# Patient Record
Sex: Male | Born: 1935 | Race: White | Hispanic: No | Marital: Married | Smoking: Former smoker
Health system: Southern US, Community
[De-identification: ages and names within clinical notes are randomized; demographics above are authoritative.]

## PROBLEM LIST (undated history)

## (undated) DIAGNOSIS — Z7901 Long term (current) use of anticoagulants: Secondary | ICD-10-CM

## (undated) DIAGNOSIS — N400 Enlarged prostate without lower urinary tract symptoms: Secondary | ICD-10-CM

## (undated) DIAGNOSIS — I452 Bifascicular block: Secondary | ICD-10-CM

## (undated) DIAGNOSIS — I4892 Unspecified atrial flutter: Secondary | ICD-10-CM

## (undated) DIAGNOSIS — I441 Atrioventricular block, second degree: Secondary | ICD-10-CM

## (undated) DIAGNOSIS — R001 Bradycardia, unspecified: Secondary | ICD-10-CM

## (undated) DIAGNOSIS — M353 Polymyalgia rheumatica: Secondary | ICD-10-CM

## (undated) DIAGNOSIS — N200 Calculus of kidney: Secondary | ICD-10-CM

## (undated) DIAGNOSIS — E785 Hyperlipidemia, unspecified: Secondary | ICD-10-CM

## (undated) DIAGNOSIS — I2510 Atherosclerotic heart disease of native coronary artery without angina pectoris: Secondary | ICD-10-CM

## (undated) HISTORY — DX: Long term (current) use of anticoagulants: Z79.01

## (undated) HISTORY — DX: Benign prostatic hyperplasia without lower urinary tract symptoms: N40.0

## (undated) HISTORY — DX: Hyperlipidemia, unspecified: E78.5

## (undated) HISTORY — DX: Atherosclerotic heart disease of native coronary artery without angina pectoris: I25.10

## (undated) HISTORY — DX: Unspecified atrial flutter: I48.92

## (undated) HISTORY — DX: Polymyalgia rheumatica: M35.3

## (undated) HISTORY — DX: Bradycardia, unspecified: R00.1

## (undated) HISTORY — PX: LUMBAR DISC SURGERY: SHX700

## (undated) HISTORY — DX: Bifascicular block: I45.2

## (undated) HISTORY — DX: Calculus of kidney: N20.0

---

## 1988-09-06 HISTORY — PX: CORONARY ARTERY BYPASS GRAFT: SHX141

## 2001-08-21 ENCOUNTER — Ambulatory Visit (HOSPITAL_COMMUNITY): Admission: RE | Admit: 2001-08-21 | Discharge: 2001-08-21 | Payer: Self-pay | Admitting: Internal Medicine

## 2001-08-21 HISTORY — PX: COLONOSCOPY: SHX174

## 2001-08-22 ENCOUNTER — Encounter: Payer: Self-pay | Admitting: Internal Medicine

## 2001-08-22 ENCOUNTER — Ambulatory Visit (HOSPITAL_COMMUNITY): Admission: RE | Admit: 2001-08-22 | Discharge: 2001-08-22 | Payer: Self-pay | Admitting: *Deleted

## 2002-07-23 ENCOUNTER — Encounter: Payer: Self-pay | Admitting: Emergency Medicine

## 2002-07-23 ENCOUNTER — Inpatient Hospital Stay (HOSPITAL_COMMUNITY): Admission: EM | Admit: 2002-07-23 | Discharge: 2002-07-25 | Payer: Self-pay | Admitting: Emergency Medicine

## 2002-09-10 ENCOUNTER — Ambulatory Visit (HOSPITAL_COMMUNITY): Admission: RE | Admit: 2002-09-10 | Discharge: 2002-09-10 | Payer: Self-pay | Admitting: Ophthalmology

## 2002-11-19 ENCOUNTER — Ambulatory Visit (HOSPITAL_COMMUNITY): Admission: RE | Admit: 2002-11-19 | Discharge: 2002-11-19 | Payer: Self-pay | Admitting: Ophthalmology

## 2004-11-11 ENCOUNTER — Ambulatory Visit (HOSPITAL_COMMUNITY): Admission: RE | Admit: 2004-11-11 | Discharge: 2004-11-11 | Payer: Self-pay | Admitting: Internal Medicine

## 2005-01-31 ENCOUNTER — Inpatient Hospital Stay (HOSPITAL_COMMUNITY): Admission: EM | Admit: 2005-01-31 | Discharge: 2005-02-02 | Payer: Self-pay | Admitting: Emergency Medicine

## 2005-02-02 ENCOUNTER — Ambulatory Visit: Payer: Self-pay | Admitting: *Deleted

## 2005-12-06 ENCOUNTER — Emergency Department: Payer: Self-pay | Admitting: Emergency Medicine

## 2005-12-20 ENCOUNTER — Emergency Department: Payer: Self-pay | Admitting: Emergency Medicine

## 2008-05-20 ENCOUNTER — Emergency Department (HOSPITAL_COMMUNITY): Admission: EM | Admit: 2008-05-20 | Discharge: 2008-05-20 | Payer: Self-pay | Admitting: Emergency Medicine

## 2008-09-09 ENCOUNTER — Ambulatory Visit: Payer: Self-pay | Admitting: Cardiology

## 2008-09-10 ENCOUNTER — Encounter (INDEPENDENT_AMBULATORY_CARE_PROVIDER_SITE_OTHER): Payer: Self-pay | Admitting: Internal Medicine

## 2008-09-10 ENCOUNTER — Ambulatory Visit: Payer: Self-pay | Admitting: Cardiology

## 2008-09-10 ENCOUNTER — Ambulatory Visit (HOSPITAL_COMMUNITY): Admission: RE | Admit: 2008-09-10 | Discharge: 2008-09-10 | Payer: Self-pay | Admitting: Internal Medicine

## 2008-10-09 ENCOUNTER — Ambulatory Visit: Payer: Self-pay | Admitting: Cardiology

## 2008-10-17 ENCOUNTER — Ambulatory Visit: Payer: Self-pay | Admitting: Cardiology

## 2008-10-24 ENCOUNTER — Ambulatory Visit: Payer: Self-pay | Admitting: Cardiology

## 2008-10-29 ENCOUNTER — Ambulatory Visit: Payer: Self-pay | Admitting: Cardiology

## 2008-11-11 ENCOUNTER — Ambulatory Visit: Payer: Self-pay | Admitting: Cardiology

## 2008-11-11 ENCOUNTER — Ambulatory Visit (HOSPITAL_COMMUNITY): Admission: RE | Admit: 2008-11-11 | Discharge: 2008-11-11 | Payer: Self-pay | Admitting: Internal Medicine

## 2008-11-12 ENCOUNTER — Ambulatory Visit (HOSPITAL_COMMUNITY): Admission: RE | Admit: 2008-11-12 | Discharge: 2008-11-12 | Payer: Self-pay | Admitting: Urology

## 2008-11-27 ENCOUNTER — Observation Stay (HOSPITAL_COMMUNITY): Admission: RE | Admit: 2008-11-27 | Discharge: 2008-11-28 | Payer: Self-pay | Admitting: Urology

## 2008-12-09 ENCOUNTER — Ambulatory Visit: Payer: Self-pay | Admitting: Cardiology

## 2008-12-16 ENCOUNTER — Ambulatory Visit (HOSPITAL_COMMUNITY): Admission: RE | Admit: 2008-12-16 | Discharge: 2008-12-16 | Payer: Self-pay | Admitting: Urology

## 2008-12-16 ENCOUNTER — Ambulatory Visit: Payer: Self-pay | Admitting: Cardiology

## 2008-12-23 ENCOUNTER — Ambulatory Visit: Payer: Self-pay | Admitting: Cardiology

## 2008-12-25 ENCOUNTER — Ambulatory Visit (HOSPITAL_COMMUNITY): Admission: RE | Admit: 2008-12-25 | Discharge: 2008-12-25 | Payer: Self-pay | Admitting: Urology

## 2008-12-30 ENCOUNTER — Ambulatory Visit: Payer: Self-pay | Admitting: Cardiology

## 2009-01-09 ENCOUNTER — Ambulatory Visit: Payer: Self-pay | Admitting: Cardiology

## 2009-01-20 ENCOUNTER — Ambulatory Visit (HOSPITAL_COMMUNITY): Admission: RE | Admit: 2009-01-20 | Discharge: 2009-01-20 | Payer: Self-pay | Admitting: Urology

## 2009-01-30 ENCOUNTER — Ambulatory Visit: Payer: Self-pay | Admitting: Cardiology

## 2009-02-24 ENCOUNTER — Ambulatory Visit: Payer: Self-pay | Admitting: Cardiology

## 2009-03-26 DIAGNOSIS — N400 Enlarged prostate without lower urinary tract symptoms: Secondary | ICD-10-CM | POA: Insufficient documentation

## 2009-03-26 DIAGNOSIS — M353 Polymyalgia rheumatica: Secondary | ICD-10-CM | POA: Insufficient documentation

## 2009-03-26 DIAGNOSIS — Z87898 Personal history of other specified conditions: Secondary | ICD-10-CM

## 2009-03-26 DIAGNOSIS — N200 Calculus of kidney: Secondary | ICD-10-CM | POA: Insufficient documentation

## 2009-03-27 ENCOUNTER — Encounter: Payer: Self-pay | Admitting: Cardiology

## 2009-03-27 ENCOUNTER — Ambulatory Visit: Payer: Self-pay | Admitting: Cardiology

## 2009-04-21 ENCOUNTER — Encounter: Payer: Self-pay | Admitting: *Deleted

## 2009-05-01 ENCOUNTER — Ambulatory Visit: Payer: Self-pay | Admitting: Cardiology

## 2009-05-14 ENCOUNTER — Ambulatory Visit: Payer: Self-pay

## 2009-05-14 LAB — CONVERTED CEMR LAB: POC INR: 1.9

## 2009-06-05 ENCOUNTER — Ambulatory Visit: Payer: Self-pay | Admitting: Cardiology

## 2009-06-26 ENCOUNTER — Ambulatory Visit: Payer: Self-pay | Admitting: Cardiology

## 2009-07-24 ENCOUNTER — Ambulatory Visit: Payer: Self-pay | Admitting: Cardiology

## 2009-08-28 ENCOUNTER — Ambulatory Visit: Payer: Self-pay | Admitting: Cardiology

## 2009-08-28 LAB — CONVERTED CEMR LAB: POC INR: 2.1

## 2009-09-03 ENCOUNTER — Encounter (INDEPENDENT_AMBULATORY_CARE_PROVIDER_SITE_OTHER): Payer: Self-pay | Admitting: *Deleted

## 2009-09-03 LAB — CONVERTED CEMR LAB
AST: 23 units/L
Alkaline Phosphatase: 60 units/L
BUN: 16 mg/dL
CO2: 22 meq/L
Calcium: 9.4 mg/dL
Chloride: 105 meq/L
Creatinine, Ser: 0.64 mg/dL
Glucose, Bld: 90 mg/dL
Potassium: 4.6 meq/L
Sodium: 141 meq/L

## 2009-09-22 ENCOUNTER — Encounter (INDEPENDENT_AMBULATORY_CARE_PROVIDER_SITE_OTHER): Payer: Self-pay | Admitting: *Deleted

## 2009-09-25 ENCOUNTER — Ambulatory Visit: Payer: Self-pay | Admitting: Cardiology

## 2009-09-25 ENCOUNTER — Encounter (INDEPENDENT_AMBULATORY_CARE_PROVIDER_SITE_OTHER): Payer: Self-pay | Admitting: *Deleted

## 2009-09-25 LAB — CONVERTED CEMR LAB: POC INR: 2.2

## 2009-09-29 ENCOUNTER — Encounter (INDEPENDENT_AMBULATORY_CARE_PROVIDER_SITE_OTHER): Payer: Self-pay | Admitting: *Deleted

## 2009-09-29 LAB — CONVERTED CEMR LAB: OCCULT 2: NEGATIVE

## 2009-10-03 ENCOUNTER — Encounter (INDEPENDENT_AMBULATORY_CARE_PROVIDER_SITE_OTHER): Payer: Self-pay | Admitting: *Deleted

## 2009-10-23 ENCOUNTER — Ambulatory Visit: Payer: Self-pay | Admitting: Cardiology

## 2009-11-20 ENCOUNTER — Ambulatory Visit: Payer: Self-pay | Admitting: Cardiology

## 2009-11-20 LAB — CONVERTED CEMR LAB: POC INR: 2.7

## 2009-12-10 ENCOUNTER — Encounter (INDEPENDENT_AMBULATORY_CARE_PROVIDER_SITE_OTHER): Payer: Self-pay | Admitting: *Deleted

## 2009-12-10 ENCOUNTER — Encounter: Payer: Self-pay | Admitting: Cardiology

## 2009-12-10 LAB — CONVERTED CEMR LAB
Cholesterol: 219 mg/dL
Cholesterol: 219 mg/dL — ABNORMAL HIGH (ref 0–200)
HCT: 46.7 %
HCT: 46.7 % (ref 39.0–52.0)
HDL: 54 mg/dL
HDL: 54 mg/dL (ref >39)
LDL Cholesterol: 149 mg/dL
LDL Cholesterol: 149 mg/dL — ABNORMAL HIGH (ref 0–99)
Lymphocytes Relative: 35 %
MCHC: 31.7 g/dL (ref 30.0–36.0)
MCV: 93.6 fL
MCV: 93.6 fL (ref 78.0–100.0)
Monocytes Absolute: 0.4 10*3/uL
Monocytes Relative: 7 %
Monocytes Relative: 7 % (ref 3–12)
Platelets: 219 10*3/uL
Platelets: 219 10*3/uL (ref 150–400)
RDW: 15 % (ref 11.5–15.5)
Triglycerides: 79 mg/dL
VLDL: 16 mg/dL (ref 0–40)
WBC: 5.7 10*3/uL
WBC: 5.7 10*3/uL (ref 4.0–10.5)

## 2009-12-18 ENCOUNTER — Ambulatory Visit: Payer: Self-pay | Admitting: Cardiology

## 2009-12-18 LAB — CONVERTED CEMR LAB: POC INR: 3.4

## 2009-12-23 ENCOUNTER — Encounter (INDEPENDENT_AMBULATORY_CARE_PROVIDER_SITE_OTHER): Payer: Self-pay | Admitting: *Deleted

## 2010-01-15 ENCOUNTER — Ambulatory Visit: Payer: Self-pay | Admitting: Cardiology

## 2010-02-12 ENCOUNTER — Ambulatory Visit: Payer: Self-pay | Admitting: Cardiology

## 2010-03-12 ENCOUNTER — Ambulatory Visit: Payer: Self-pay | Admitting: Cardiology

## 2010-04-09 ENCOUNTER — Ambulatory Visit: Payer: Self-pay | Admitting: Cardiology

## 2010-04-15 ENCOUNTER — Encounter: Payer: Self-pay | Admitting: Cardiology

## 2010-05-14 ENCOUNTER — Ambulatory Visit: Payer: Self-pay | Admitting: Cardiology

## 2010-06-11 ENCOUNTER — Ambulatory Visit: Payer: Self-pay | Admitting: Cardiology

## 2010-07-09 ENCOUNTER — Ambulatory Visit: Payer: Self-pay | Admitting: Cardiology

## 2010-07-09 LAB — CONVERTED CEMR LAB: POC INR: 2.4

## 2010-08-06 ENCOUNTER — Ambulatory Visit: Payer: Self-pay | Admitting: Cardiology

## 2010-09-03 ENCOUNTER — Ambulatory Visit: Payer: Self-pay | Admitting: Cardiology

## 2010-09-03 LAB — CONVERTED CEMR LAB: POC INR: 2.6

## 2010-09-21 LAB — CONVERTED CEMR LAB
Albumin: 4.7 g/dL
CO2: 27 meq/L
Chloride: 106 meq/L
Creatinine, Ser: 0.67 mg/dL
HCT: 45 %
HDL: 54 mg/dL
Hemoglobin: 14.8 g/dL
Platelets: 196 10*3/uL
Potassium: 4.5 meq/L
Total Bilirubin: 0.6 mg/dL
Total Protein: 7 g/dL
WBC: 5.2 10*3/uL

## 2010-09-29 ENCOUNTER — Encounter: Payer: Self-pay | Admitting: Adult Health

## 2010-09-29 ENCOUNTER — Encounter (INDEPENDENT_AMBULATORY_CARE_PROVIDER_SITE_OTHER): Payer: Self-pay | Admitting: *Deleted

## 2010-10-01 ENCOUNTER — Ambulatory Visit
Admission: RE | Admit: 2010-10-01 | Discharge: 2010-10-01 | Payer: Self-pay | Source: Home / Self Care | Attending: Cardiology | Admitting: Cardiology

## 2010-10-01 LAB — CONVERTED CEMR LAB: POC INR: 2.2

## 2010-10-06 NOTE — Medication Information (Signed)
Summary: ccr-lr  Anticoagulant Therapy  Managed by: Vashti Hey, RN PCP: Dr. [name] Supervising MD: Daleen Squibb MD, Maisie Fus Indication 1: Atrial Flutter (ICD-427.32) Lab Used: [facility]  Site: [place] INR POC 2.0  Dietary changes: no    Health status changes: no    Bleeding/hemorrhagic complications: no    Recent/future hospitalizations: no    Any changes in medication regimen? no    Recent/future dental: no  Any missed doses?: no       Is patient compliant with meds? yes       Allergies: No Known Drug Allergies  Anticoagulation Management History:      The patient is taking warfarin and comes in today for a routine follow up visit.  Positive risk factors for bleeding include an age of 49 years or older.  The bleeding index is 'intermediate risk'.  Positive CHADS2 values include Age > 36 years old.  The start date was 10/09/2008.  Anticoagulation responsible provider: Daleen Squibb MD, Maisie Fus.  INR POC: 2.0.  Cuvette Lot#: [id number].  Exp: 10/11.    Anticoagulation Management Assessment/Plan:      The patient's current anticoagulation dose is Warfarin sodium 5 mg tabs: as directed by coumadin clinic.  The target INR is 2 - 3.  The next INR is due 09/03/2010.  Anticoagulation instructions were given to patient.  Results were reviewed/authorized by Vashti Hey, RN.  He was notified by Vashti Hey RN.         Prior Anticoagulation Instructions: INR 2.4 Continue coumadin 7.5mg  once daily except 5mg  Mondays and Fridays  Current Anticoagulation Instructions: INR 2.0 Tonight take coumadin 2 tablets tonight then resume 7.5mg  once daily except 5mg  on Mondays and Fridays

## 2010-10-06 NOTE — Medication Information (Signed)
Summary: ccr-lr  Anticoagulant Therapy  Managed by: Vashti Hey, RN PCP: Dr. [name] Supervising MD: Dietrich Pates MD, Molly Maduro Indication 1: Atrial Flutter (ICD-427.32) Lab Used: [facility] [place] Site: [place] INR POC 3.4  Dietary changes: no    Health status changes: no    Bleeding/hemorrhagic complications: no    Recent/future hospitalizations: no    Any changes in medication regimen? no    Recent/future dental: no  Any missed doses?: no       Is patient compliant with meds? yes       Allergies: No Known Drug Allergies  Anticoagulation Management History:      Positive risk factors for bleeding include an age of 17 years or older.  The bleeding index is 'intermediate risk'.  Negative CHADS2 values include Age > 81 years old.  The start date was 10/09/2008.  Anticoagulation responsible provider: Dietrich Pates MD, Molly Maduro.  INR POC: 3.4.  Exp: 10/11.    Anticoagulation Management Assessment/Plan:      The patient's current anticoagulation dose is Warfarin sodium 5 mg tabs: as directed by coumadin clinic.  The target INR is 2 - 3.  The next INR is due 01/15/2010.  Anticoagulation instructions were given to patient.  Results were reviewed/authorized by Vashti Hey, RN.  He was notified by Vashti Hey RN.         Prior Anticoagulation Instructions: INR 2.7 Continue coumadin 7.5mg  once daily except 5mg  on Mondays and Fridays  Current Anticoagulation Instructions: INR 3.4 Take coumadin 1/2 tablet tonight then resume 1 1/2 tablets once daily except 1 tablet on Mondays and Fridays

## 2010-10-06 NOTE — Miscellaneous (Signed)
Summary: labs cmp,lipids,09/03/2009  Clinical Lists Changes  Observations: Added new observation of CALCIUM: 9.4 mg/dL (09/81/1914 78:29) Added new observation of ALBUMIN: 4.3 g/dL (56/21/3086 57:84) Added new observation of PROTEIN, TOT: 6.9 g/dL (69/62/9528 41:32) Added new observation of SGPT (ALT): 14 units/L (09/03/2009 15:48) Added new observation of SGOT (AST): 23 units/L (09/03/2009 15:48) Added new observation of ALK PHOS: 60 units/L (09/03/2009 15:48) Added new observation of CREATININE: 0.64 mg/dL (44/09/270 53:66) Added new observation of BUN: 16 mg/dL (44/11/4740 59:56) Added new observation of BG RANDOM: 90 mg/dL (38/75/6433 29:51) Added new observation of CO2 PLSM/SER: 22 meq/L (09/03/2009 15:48) Added new observation of CL SERUM: 105 meq/L (09/03/2009 15:48) Added new observation of K SERUM: 4.6 meq/L (09/03/2009 15:48) Added new observation of NA: 141 meq/L (09/03/2009 15:48) Added new observation of LDL: 172 mg/dL (88/41/6606 30:16) Added new observation of HDL: 56 mg/dL (09/14/3233 57:32) Added new observation of TRIGLYC TOT: 81 mg/dL (20/25/4270 62:37) Added new observation of CHOLESTEROL: 244 mg/dL (62/83/1517 61:60)

## 2010-10-06 NOTE — Medication Information (Signed)
Summary: ccr-lr  Anticoagulant Therapy  Managed by: Vashti Hey, RN PCP: Dr. [name] Supervising MD: Dietrich Pates MD, Molly Maduro Indication 1: Atrial Flutter (ICD-427.32) Lab Used: [facility] [place] Site: [place] INR POC 2.4  Dietary changes: no    Health status changes: no    Bleeding/hemorrhagic complications: no    Recent/future hospitalizations: no    Any changes in medication regimen? no    Recent/future dental: no  Any missed doses?: no       Is patient compliant with meds? yes       Allergies: No Known Drug Allergies  Anticoagulation Management History:      The patient is taking warfarin and comes in today for a routine follow up visit.  Positive risk factors for bleeding include an age of 75 years or older.  The bleeding index is 'intermediate risk'.  Negative CHADS2 values include Age > 30 years old.  The start date was 10/09/2008.  Anticoagulation responsible provider: Dietrich Pates MD, Molly Maduro.  INR POC: 2.4.  Cuvette Lot#: [id number].  Exp: 10/11.    Anticoagulation Management Assessment/Plan:      The patient's current anticoagulation dose is Warfarin sodium 5 mg tabs: as directed by coumadin clinic.  The target INR is 2 - 3.  The next INR is due 08/06/2010.  Anticoagulation instructions were given to patient.  Results were reviewed/authorized by Vashti Hey, RN.  He was notified by Vashti Hey RN.         Prior Anticoagulation Instructions: INR 2.3 Continue coumadin 7.5mg  once daily except 5mg  on Mondays and Fridays  Current Anticoagulation Instructions: INR 2.4 Continue coumadin 7.5mg  once daily except 5mg  Mondays and Fridays

## 2010-10-06 NOTE — Medication Information (Signed)
Summary: ccr-lr  Anticoagulant Therapy  Managed by: Vashti Hey, RN PCP: Dr. [name] Supervising MD: Daleen Squibb MD, Maisie Fus Indication 1: Atrial Flutter (ICD-427.32) Lab Used: [facility] [place] Site: [place] INR POC 2.5  Dietary changes: no    Health status changes: no    Bleeding/hemorrhagic complications: no    Recent/future hospitalizations: no    Any changes in medication regimen? no    Recent/future dental: no  Any missed doses?: no       Is patient compliant with meds? yes       Allergies: No Known Drug Allergies  Anticoagulation Management History:      The patient is taking warfarin and comes in today for a routine follow up visit.  Positive risk factors for bleeding include an age of 75 years or older.  The bleeding index is 'intermediate risk'.  Negative CHADS2 values include Age > 75 years old.  The start date was 10/09/2008.  Anticoagulation responsible provider: Daleen Squibb MD, Maisie Fus.  INR POC: 2.5.  Cuvette Lot#: [id number].  Exp: 10/11.    Anticoagulation Management Assessment/Plan:      The patient's current anticoagulation dose is Warfarin sodium 5 mg tabs: as directed by coumadin clinic.  The target INR is 2 - 3.  The next INR is due 11/20/2009.  Anticoagulation instructions were given to patient.  Results were reviewed/authorized by Vashti Hey, RN.  He was notified by Vashti Hey RN.         Prior Anticoagulation Instructions: INR 2.2 Continue coumadin 7.5mg  once daily except 5mg  on Mondays and Fridays  Current Anticoagulation Instructions: INR 2.5 Continue coumadin 7.5mg  once daily except 5mg  on Mondays and Fridays

## 2010-10-06 NOTE — Medication Information (Signed)
Summary: ccr-lr  Anticoagulant Therapy  Managed by: Vashti Hey, RN PCP: Dr. [name] Supervising MD: Dietrich Pates MD, Molly Maduro Indication 1: Atrial Flutter (ICD-427.32) Lab Used: [facility] [place] Site: [place] INR POC 2.9  Dietary changes: no    Health status changes: no    Bleeding/hemorrhagic complications: no    Recent/future hospitalizations: no    Any changes in medication regimen? no    Recent/future dental: no  Any missed doses?: no       Is patient compliant with meds? yes       Allergies: No Known Drug Allergies  Anticoagulation Management History:      The patient is taking warfarin and comes in today for a routine follow up visit.  Positive risk factors for bleeding include an age of 14 years or older.  The bleeding index is 'intermediate risk'.  Negative CHADS2 values include Age > 59 years old.  The start date was 10/09/2008.  Anticoagulation responsible provider: Dietrich Pates MD, Molly Maduro.  INR POC: 2.9.  Cuvette Lot#: [id number].  Exp: 10/11.    Anticoagulation Management Assessment/Plan:      The patient's current anticoagulation dose is Warfarin sodium 5 mg tabs: as directed by coumadin clinic.  The target INR is 2 - 3.  The next INR is due 02/12/2010.  Anticoagulation instructions were given to patient.  Results were reviewed/authorized by Vashti Hey, RN.  He was notified by Vashti Hey RN.         Prior Anticoagulation Instructions: INR 3.4 Take coumadin 1/2 tablet tonight then resume 1 1/2 tablets once daily except 1 tablet on Mondays and Fridays  Current Anticoagulation Instructions: INR 2.9 Continue coumadin 7.5mg  once daily except 5mg  on Mondays and Fridays

## 2010-10-06 NOTE — Medication Information (Signed)
Summary: ccr-lr  Anticoagulant Therapy  Managed by: Vashti Hey, RN PCP: Dr. [name] Supervising MD: Dietrich Pates MD, Molly Maduro Indication 1: Atrial Flutter (ICD-427.32) Lab Used: [facility]  Site: [place] INR POC 2.0  Dietary changes: no    Health status changes: no    Bleeding/hemorrhagic complications: no    Recent/future hospitalizations: no    Any changes in medication regimen? no    Recent/future dental: no  Any missed doses?: no       Is patient compliant with meds? yes       Allergies: No Known Drug Allergies  Anticoagulation Management History:      The patient is taking warfarin and comes in today for a routine follow up visit.  Positive risk factors for bleeding include an age of 6 years or older.  The bleeding index is 'intermediate risk'.  Negative CHADS2 values include Age > 76 years old.  The start date was 10/09/2008.  Anticoagulation responsible provider: Dietrich Pates MD, Molly Maduro.  INR POC: 2.0.  Cuvette Lot#: [id number].  Exp: 10/11.    Anticoagulation Management Assessment/Plan:      The patient's current anticoagulation dose is Warfarin sodium 5 mg tabs: as directed by coumadin clinic.  The target INR is 2 - 3.  The next INR is due 06/11/2010.  Anticoagulation instructions were given to patient.  Results were reviewed/authorized by Vashti Hey, RN.  He was notified by Vashti Hey RN.         Prior Anticoagulation Instructions: INR 2.4 Continue coumadin 7.5mg  once daily except 5mg  on Mondays and Fridays  Current Anticoagulation Instructions: INR 2.0 Take coumadin 2 tablets tonight then resume 1 1/2 tablets once daily except 1 tablet on Mondays and Fridays

## 2010-10-06 NOTE — Letter (Signed)
Summary:  Future Lab Work Engineer, agricultural at [employer]  [address], [place] [address]   Phone: [phone]  Fax: [phone]     September 25, 2009 MRN: [id number]   THIAGO RAGSDALE [address] [facility] [address], [place]  [address]      YOUR LAB WORK IS DUE   _____________APRIL 20, 2011____________________________  Please go to [facility], located across the street from [facility] on the second floor.  Hours are Monday - Friday 7am until 7:30pm         Saturday 8am until 12noon    _X_  DO NOT EAT OR DRINK AFTER MIDNIGHT EVENING PRIOR TO LABWORK  __ YOUR LABWORK IS NOT FASTING --YOU MAY EAT PRIOR TO LABWORK

## 2010-10-06 NOTE — Miscellaneous (Signed)
Summary: stool cards   Clinical Lists Changes  Observations: Added new observation of HEMOCCULT 3: neg (09/29/2009 8:20) Added new observation of HEMOCCULT 2: neg (09/29/2009 8:20) Added new observation of HEMOCCULT 1: neg (09/29/2009 8:20)

## 2010-10-06 NOTE — Assessment & Plan Note (Signed)
Summary: 6 MTH F/U PER CHECKOUT ON 03/27/09/TG   Visit Type:  Follow-up Primary Provider:  Dr. [name]   History of Present Illness: Return visit for this very pleasant 77 year old gentleman with coronary artery disease and paroxysmal atrial flutter.  Since his last visit, he has continued to do extremely well.  He is asymptomatic from a cardiovascular standpoint.  At a recent office visit with Dr. Ouida Sills, electrocardiogram documented a return to normal sinus rhythm.  Current Medications (verified): 1)  Metoprolol Tartrate 25 Mg Tabs (Metoprolol Tartrate) .[name].. 1 Tab Two Times A Day 2)  Warfarin Sodium 5 Mg Tabs (Warfarin Sodium) .... As Directed By Coumadin Clinic 3)  Flomax 0.4 Mg Xr24h-Cap (Tamsulosin Hcl) .[name].. 1 Tab Once Daily 4)  Tylenol Ex St Arthritis Pain 500 Mg Tabs (Acetaminophen) .... As Needed 5)  Lovastatin 20 Mg Tabs (Lovastatin) .... Take 1 Tab Daily  Allergies (verified): No Known Drug Allergies  Past History:  PMH, FH, and Social History reviewed and updated.  Past Medical History: ASCVD-CABG surgery in 1990. Myocardial infarction in 1986; coronary angiography in 2003-critical LAD       stenosis with patent LIMA; total obstruction of the RCA with patent RIMA; low normal EF. Paroxysmal ATRIAL FLUTTER (ICD-427.32): onset in 2010; 4:1 AVB with low-dose metoprolol; moderate left      atrial enlargement and mild left ventricular hypertrophy with normal ejection fraction by echocardiography HYPERLIPIDEMIA (ICD-272.4) POLYMYALGIA RHEUMATICA (ICD-725) BENIGN PROSTATIC HYPERTROPHY, HX OF (ICD-V13.8) NEPHROLITHIASIS (ICD-592.0); history of profuse hematuria  Past Surgical History: CABG-1990 Lumbar  Diskectomy Back surgery  Review of Systems       The patient complains of weight loss.  The patient denies vision loss, decreased hearing, hoarseness, chest pain, syncope, dyspnea on exertion, peripheral edema, prolonged cough, headaches, hemoptysis, abdominal pain, and melena.     Vital Signs:  Patient profile:   77 year old male Weight:      194 pounds Pulse rate:   57 / minute BP sitting:   118 / 72  (right arm)  Vitals Entered By: Dreama Saa, CNA (September 25, 2009 2:37 PM)  Physical Exam  General:   General:  Well developed, well nourished, in no acute distress. Neck:  No jugular venous distention; normal carotid upstrokes without bruits. Lungs:  Clear bilaterally to auscultation and percussion. Heart:  normal first and second heart sounds; regular rhythm Abdomen:  Bowel sounds positive; abdomen soft and non-tender without masses, organomegaly, or hernias noted. No hepatosplenomegaly. Pulses:  distal pulses normal  Extremities: trace ankle edema Neurologic:  Alert and oriented x 3; normal cranial nerves; symmetric strength and tone. Psych:  Normal affect.    Impression & Recommendations:  Problem # 1:  COUMADIN THERAPY (ICD-V58.61) INRs have been stable and therapeutic.  Although atrial flutter is not present at this visit, recurrence is almost a certainty.  Accordingly, full anticoagulation will be continued.  A CBC and stool for Hemoccult testing will be obtained to monitor this.  Problem # 2:  ATHEROSCLEROTIC CARDIOVASCULAR DISEASE (ICD-429.2) No symptoms to suggest recurrent myocardial ischemia.  LV systolic function normal at recent echocardiogram.  No specific intervention is necessary at the present time.  Aspirin is not being utilized due to the patient's need for anticoagulation with warfarin.  Problem # 3:  HYPERLIPIDEMIA (ICD-272.4) Treatment with lovastatin has been initiated due to abnormalities on a recent lipid profile. Simvastatin had originally been discontinued due to myalgias, which were ultimately attributed to PMR.  Hopefully, he will tolerate this medication,  and it will be effective, but the equivalent simvastatin dose would be 10 mg, versus the 80 mg he was on at the time the drug was discontinued.  Dr. Ouida Sills plans to obtain  an repeat lipid profile in 3 months and to adjust therapy accordingly.[name]  LDL should be as close to 70 as possible.  I would not be reluctant to resume simvastatin at a dose of 40 mg q.d., if necessary.  I will reassess this nice gentleman in 6 months.  Other Orders: Hemoccult Cards (Take Home) (Hemoccult Cards) Future Orders: T-CBC w/Diff ([id number]) ... 12/24/2009 T-Lipid Profile [id number]) ... 12/24/2009  Patient Instructions: 1)  Your physician recommends that you schedule a follow-up appointment in: 6 months 2)  Your physician recommends that you return for lab work in: 3 months 3)  Your physician has asked that you test your stool for blood. It is necessary to test 3 different stool specimens for accuracy. You will be given 3 hemoccult cards for specimen collection. For each stool specimen, place a small portion of stool sample (from 2 different areas of the stool) into the 2 squares on the card. Close card. Repeat with 2 more stool specimens. Bring the cards back to the office for testing.  EKG  Procedure date:  09/25/2009  Findings:      Rhythm Strip  Sinus bradycardia at a rate of 54 Borderline IVCD First degree AV block with a PR interval of 210 ms Comparison to prior rhythm strip of 12/23/2008, sinus rhythm has replaced atrial flutter.

## 2010-10-06 NOTE — Medication Information (Signed)
Summary: ccr-lr  Anticoagulant Therapy  Managed by: Shane Hey, Shane [name] PCP: Dr. [name] Supervising MD: Diona Browner MD, Remi Deter Indication 1: Atrial Flutter (ICD-427.32) Lab Used: [facility] [place] Site: [place] INR POC 2.6  Dietary changes: no    Health status changes: no    Bleeding/hemorrhagic complications: no    Recent/future hospitalizations: no    Any changes in medication regimen? no    Recent/future dental: no  Any missed doses?: no       Is patient compliant with meds? yes       Allergies: No Known Drug Allergies  Anticoagulation Management History:      The patient is taking warfarin and comes in today for a routine follow up visit.  Positive risk factors for bleeding include an age of 38 years or older.  The bleeding index is 'intermediate risk'.  Negative CHADS2 values include Age > 57 years old.  The start date was 10/09/2008.  Anticoagulation responsible provider: Diona Browner MD, Remi Deter.  INR POC: 2.6.  Cuvette Lot#: [id number].  Exp: 10/11.    Anticoagulation Management Assessment/Plan:      The patient's current anticoagulation dose is Warfarin sodium 5 mg tabs: as directed by coumadin clinic.  The target INR is 2 - 3.  The next INR is due 04/09/2010.  Anticoagulation instructions were given to patient.  Results were reviewed/authorized by Shane Hey, Shane [name].  He was notified by Shane Hey Shane [name].         Prior Anticoagulation Instructions: INR 3.3 5mg  Tablet Take coumadin 1/2 tablet tonight then resume 1 1/2 tablets once daily except 1 tablet on Mondays and Fridays  Current Anticoagulation Instructions: INR 2.6 Continue coumadin 7.5mg  once daily except 5mg  on Mondays and Fridays

## 2010-10-06 NOTE — Medication Information (Signed)
Summary: ccr-lr  Anticoagulant Therapy  Managed by: Vashti Hey, RN PCP: Dr. [name] Supervising MD: Dietrich Pates MD, Molly Maduro Indication 1: Atrial Flutter (ICD-427.32) Lab Used: [facility] [place] Site: [place] INR POC 2.3  Dietary changes: no    Health status changes: no    Bleeding/hemorrhagic complications: no    Recent/future hospitalizations: no    Any changes in medication regimen? no    Recent/future dental: no  Any missed doses?: no       Is patient compliant with meds? yes       Allergies: No Known Drug Allergies  Anticoagulation Management History:      The patient is taking warfarin and comes in today for a routine follow up visit.  Positive risk factors for bleeding include an age of 4 years or older.  The bleeding index is 'intermediate risk'.  Negative CHADS2 values include Age > 57 years old.  The start date was 10/09/2008.  Anticoagulation responsible provider: Dietrich Pates MD, Molly Maduro.  INR POC: 2.3.  Cuvette Lot#: [id number].  Exp: 10/11.    Anticoagulation Management Assessment/Plan:      The patient's current anticoagulation dose is Warfarin sodium 5 mg tabs: as directed by coumadin clinic.  The target INR is 2 - 3.  The next INR is due 07/09/2010.  Anticoagulation instructions were given to patient.  Results were reviewed/authorized by Vashti Hey, RN.  He was notified by Vashti Hey RN.         Prior Anticoagulation Instructions: INR 2.0 Take coumadin 2 tablets tonight then resume 1 1/2 tablets once daily except 1 tablet on Mondays and Fridays  Current Anticoagulation Instructions: INR 2.3 Continue coumadin 7.5mg  once daily except 5mg  on Mondays and Fridays

## 2010-10-06 NOTE — Medication Information (Signed)
Summary: ccr-lr  Anticoagulant Therapy  Managed by: Vashti Hey, RN PCP: Dr. [name] Supervising MD: Dietrich Pates MD, Molly Maduro Indication 1: Atrial Flutter (ICD-427.32) Lab Used: [facility] [place] Site: [place] INR POC 3.3  Dietary changes: no    Health status changes: no    Bleeding/hemorrhagic complications: no    Recent/future hospitalizations: no    Any changes in medication regimen? no    Recent/future dental: no  Any missed doses?: no       Is patient compliant with meds? yes       Allergies: No Known Drug Allergies  Anticoagulation Management History:      The patient is taking warfarin and comes in today for a routine follow up visit.  Positive risk factors for bleeding include an age of 76 years or older.  The bleeding index is 'intermediate risk'.  Negative CHADS2 values include Age > 51 years old.  The start date was 10/09/2008.  Anticoagulation responsible provider: Dietrich Pates MD, Molly Maduro.  INR POC: 3.3.  Cuvette Lot#: [id number].  Exp: 10/11.    Anticoagulation Management Assessment/Plan:      The patient's current anticoagulation dose is Warfarin sodium 5 mg tabs: as directed by coumadin clinic.  The target INR is 2 - 3.  The next INR is due 03/12/2010.  Anticoagulation instructions were given to patient.  Results were reviewed/authorized by Vashti Hey, RN.  He was notified by Vashti Hey RN.         Prior Anticoagulation Instructions: INR 2.9 Continue coumadin 7.5mg  once daily except 5mg  on Mondays and Fridays  Current Anticoagulation Instructions: INR 3.3 5mg  Tablet Take coumadin 1/2 tablet tonight then resume 1 1/2 tablets once daily except 1 tablet on Mondays and Fridays

## 2010-10-06 NOTE — Medication Information (Signed)
Summary: 4 wkprotime per checkout on 12/23/tg  Anticoagulant Therapy  Managed by: Vashti Hey, RN PCP: Dr. [name] Supervising MD: Dietrich Pates MD, Molly Maduro Indication 1: Atrial Flutter (ICD-427.32) Lab Used: [facility] [place] Site: [place] INR POC 2.2  Dietary changes: no    Health status changes: no    Bleeding/hemorrhagic complications: no    Recent/future hospitalizations: no    Any changes in medication regimen? no    Recent/future dental: no  Any missed doses?: no       Is patient compliant with meds? yes       Allergies: No Known Drug Allergies  Anticoagulation Management History:      The patient is taking warfarin and comes in today for a routine follow up visit.  Positive risk factors for bleeding include an age of 75 years or older.  The bleeding index is 'intermediate risk'.  Negative CHADS2 values include Age > 66 years old.  The start date was 10/09/2008.  Anticoagulation responsible provider: Dietrich Pates MD, Molly Maduro.  INR POC: 2.2.  Cuvette Lot#: [id number].  Exp: 10/11.    Anticoagulation Management Assessment/Plan:      The patient's current anticoagulation dose is Warfarin sodium 5 mg tabs: as directed by coumadin clinic.  The target INR is 2 - 3.  The next INR is due 10/23/2009.  Anticoagulation instructions were given to patient.  Results were reviewed/authorized by Vashti Hey, RN.  He was notified by Vashti Hey RN.         Prior Anticoagulation Instructions: INR 2.1 Continue coumadin 7.5mg  once daily except 5mg  on Mondays and Fridays  Current Anticoagulation Instructions: INR 2.2 Continue coumadin 7.5mg  once daily except 5mg  on Mondays and Fridays

## 2010-10-06 NOTE — Medication Information (Signed)
Summary: Coumadin Clinic  Anticoagulant Therapy  Managed by: Vashti Hey, RN PCP: Dr. [name] Supervising MD: Diona Browner MD, Remi Deter Indication 1: Atrial Flutter (ICD-427.32) Lab Used: [facility] [place] Site: [place] INR POC 2.4  Dietary changes: no    Health status changes: no    Bleeding/hemorrhagic complications: no    Recent/future hospitalizations: no    Any changes in medication regimen? no    Recent/future dental: no  Any missed doses?: no       Is patient compliant with meds? yes       Allergies: No Known Drug Allergies  Anticoagulation Management History:      The patient is taking warfarin and comes in today for a routine follow up visit.  Positive risk factors for bleeding include an age of 24 years or older.  The bleeding index is 'intermediate risk'.  Negative CHADS2 values include Age > 2 years old.  The start date was 10/09/2008.  Anticoagulation responsible [place]: Diona Browner MD, Remi Deter.  INR POC: 2.4.  Cuvette Lot#: [id number].  Exp: 10/11.    Anticoagulation Management Assessment/Plan:      The patient's current anticoagulation dose is Warfarin sodium 5 mg tabs: as directed by coumadin clinic.  The target INR is 2 - 3.  The next INR is due 05/14/2010.  Anticoagulation instructions were given to patient.  Results were reviewed/authorized by Vashti Hey, RN.  He was notified by Vashti Hey RN.         Prior Anticoagulation Instructions: INR 2.6 Continue coumadin 7.5mg  once daily except 5mg  on Mondays and Fridays  Current Anticoagulation Instructions: INR 2.4 Continue coumadin 7.5mg  once daily except 5mg  on Mondays and Fridays

## 2010-10-06 NOTE — Miscellaneous (Signed)
Summary: cbcd,lipids 12/10/2009  Clinical Lists Changes  Observations: Added new observation of LDL: 16 mg/dL (57/84/6962 9:52) Added new observation of HDL: 54 mg/dL (84/13/2440 1:02) Added new observation of TRIGLYC TOT: 79 mg/dL (72/53/6644 0:34) Added new observation of CHOLESTEROL: 219 mg/dL (74/25/9563 8:75) Added new observation of PLATELETK/UL: 219 K/uL (12/10/2009 9:51) Added new observation of MCV: 93.6 fL (12/10/2009 9:51) Added new observation of HCT: 46.7 % (12/10/2009 9:51) Added new observation of HGB: 14.8 g/dL (64/33/2951 8:84) Added new observation of WBC COUNT: 5.7 10*3/microliter (12/10/2009 9:51)

## 2010-10-06 NOTE — Medication Information (Signed)
Summary: ccr-lr  Anticoagulant Therapy  Managed by: Vashti Hey, RN PCP: Dr. [name] Supervising MD: Dietrich Pates MD, Molly Maduro Indication 1: Atrial Flutter (ICD-427.32) Lab Used: [facility] [place] Site: [place] INR POC 2.7  Dietary changes: no    Health status changes: no    Bleeding/hemorrhagic complications: no    Recent/future hospitalizations: no    Any changes in medication regimen? no    Recent/future dental: no  Any missed doses?: no       Is patient compliant with meds? yes       Allergies: No Known Drug Allergies  Anticoagulation Management History:      The patient is taking warfarin and comes in today for a routine follow up visit.  Positive risk factors for bleeding include an age of 75 years or older.  The bleeding index is 'intermediate risk'.  Negative CHADS2 values include Age > 84 years old.  The start date was 10/09/2008.  Anticoagulation responsible provider: Dietrich Pates MD, Molly Maduro.  INR POC: 2.7.  Cuvette Lot#: [id number].  Exp: 10/11.    Anticoagulation Management Assessment/Plan:      The patient's current anticoagulation dose is Warfarin sodium 5 mg tabs: as directed by coumadin clinic.  The target INR is 2 - 3.  The next INR is due 12/18/2009.  Anticoagulation instructions were given to patient.  Results were reviewed/authorized by Vashti Hey, RN.  He was notified by Vashti Hey RN.         Prior Anticoagulation Instructions: INR 2.5 Continue coumadin 7.5mg  once daily except 5mg  on Mondays and Fridays  Current Anticoagulation Instructions: INR 2.7 Continue coumadin 7.5mg  once daily except 5mg  on Mondays and Fridays

## 2010-10-08 NOTE — Letter (Signed)
Summary: Handout Printed  Printed Handout:  - Diet - Low-Cholesterol Guidelines 

## 2010-10-08 NOTE — Letter (Signed)
Summary: Handout Printed  Printed Handout:  - Diet - Cholesterol Control (without Cholesterol Chart) 

## 2010-10-08 NOTE — Miscellaneous (Signed)
Summary: labs cbcd,lipids,12/10/2009  Clinical Lists Changes  Observations: Added new observation of LDL: 149 mg/dL (30/16/0109 3:23) Added new observation of HDL: 54 mg/dL (55/73/2202 5:42) Added new observation of TRIGLYC TOT: 79 mg/dL (70/62/3762 8:31) Added new observation of CHOLESTEROL: 219 mg/dL (51/76/1607 3:71) Added new observation of ABSOLUTE BAS: 0.0 K/uL (12/10/2009 9:13) Added new observation of BASOPHIL %: 1 % (12/10/2009 9:13) Added new observation of EOS ABSLT: 0.1 K/uL (12/10/2009 9:13) Added new observation of % EOS AUTO: 2 % (12/10/2009 9:13) Added new observation of ABSOLUTE MON: 0.4 K/uL (12/10/2009 9:13) Added new observation of MONOCYTE %: 7 % (12/10/2009 9:13) Added new observation of ABS LYMPHOCY: 2.0 K/uL (12/10/2009 9:13) Added new observation of LYMPHS %: 35 % (12/10/2009 9:13) Added new observation of PLATELETK/UL: 219 K/uL (12/10/2009 9:13) Added new observation of RDW: 15.0 % (12/10/2009 9:13) Added new observation of MCHC RBC: 31.7 g/dL (03/01/9484 4:62) Added new observation of MCV: 93.6 fL (12/10/2009 9:13) Added new observation of HCT: 46.7 % (12/10/2009 9:13) Added new observation of HGB: 14.8 g/dL (70/35/0093 8:18) Added new observation of RBC M/UL: 4.99 M/uL (12/10/2009 9:13) Added new observation of WBC COUNT: 5.7 10*3/microliter (12/10/2009 9:13)

## 2010-10-08 NOTE — Medication Information (Signed)
Summary: ccr-lr  Anticoagulant Therapy  Managed by: Vashti Hey, RN PCP: Dr. [name] Supervising MD: Dietrich Pates MD, Molly Maduro Indication 1: Atrial Flutter (ICD-427.32) Lab Used: [facility] [place] Site: [place] INR POC 2.6  Dietary changes: no    Health status changes: no    Bleeding/hemorrhagic complications: no    Recent/future hospitalizations: no    Any changes in medication regimen? no    Recent/future dental: no  Any missed doses?: no       Is patient compliant with meds? yes       Allergies: No Known Drug Allergies  Anticoagulation Management History:      The patient is taking warfarin and comes in today for a routine follow up visit.  Positive risk factors for bleeding include an age of 75 years or older.  The bleeding index is 'intermediate risk'.  Positive CHADS2 values include Age > 51 years old.  The start date was 10/09/2008.  Anticoagulation responsible provider: Dietrich Pates MD, Molly Maduro.  INR POC: 2.6.  Cuvette Lot#: [id number].  Exp: 10/11.    Anticoagulation Management Assessment/Plan:      The patient's current anticoagulation dose is Warfarin sodium 5 mg tabs: as directed by coumadin clinic.  The target INR is 2 - 3.  The next INR is due 10/01/2010.  Anticoagulation instructions were given to patient.  Results were reviewed/authorized by Vashti Hey, RN.  He was notified by Vashti Hey RN.         Prior Anticoagulation Instructions: INR 2.0 Tonight take coumadin 2 tablets tonight then resume 7.5mg  once daily except 5mg  on Mondays and Fridays  Current Anticoagulation Instructions: INR 2.6 Continue coumadin 7.5mg  once daily except 5mg  on Mondays and Fridays

## 2010-10-08 NOTE — Medication Information (Signed)
Summary: [facility]  Anticoagulant Therapy  Managed by: Vashti Hey, RN PCP: Dr. [name] Supervising MD: Dietrich Pates MD, Molly Maduro Indication 1: Atrial Flutter (ICD-427.32) Lab Used: [facility] [place] Site: [place] INR POC 2.2  Dietary changes: no    Health status changes: no    Bleeding/hemorrhagic complications: no    Recent/future hospitalizations: no    Any changes in medication regimen? no    Recent/future dental: no  Any missed doses?: no       Is patient compliant with meds? yes       Allergies: No Known Drug Allergies  Anticoagulation Management History:      The patient is taking warfarin and comes in today for a routine follow up visit.  Positive risk factors for bleeding include an age of 75 years or older.  The bleeding index is 'intermediate risk'.  Positive CHADS2 values include Age > 75 years old.  The start date was 10/09/2008.  Anticoagulation responsible provider: Dietrich Pates MD, Molly Maduro.  INR POC: 2.2.  Cuvette Lot#: [id number].  Exp: 10/11.    Anticoagulation Management Assessment/Plan:      The patient's current anticoagulation dose is Warfarin sodium 5 mg tabs: as directed by coumadin clinic.  The target INR is 2 - 3.  The next INR is due 10/29/2010.  Anticoagulation instructions were given to patient.  Results were reviewed/authorized by Vashti Hey, RN.  He was notified by Vashti Hey RN.         Prior Anticoagulation Instructions: INR 2.6 Continue coumadin 7.5mg  once daily except 5mg  on Mondays and Fridays  Current Anticoagulation Instructions: INR 2.2 Continue coumadin 7.5mg  once daily except 5mg  on Mondays and Fridays

## 2010-10-14 NOTE — Assessment & Plan Note (Signed)
Summary: 1 yr f/u per checkout on 09/25/09/tg   Visit Type:  Follow-up Primary Provider:  Dr. [name]   History of Present Illness: [name] returns to the office as scheduled for continued assessment and treatment of coronary artery disease, hyperlipidemia and atrial flutter.  Since his last visit, he has done extraordinarily well.  He reports normal activity without any cardiopulmonary symptoms.  He denies palpitations, but has not had any symptoms with atrial flutter when present in the past.  He continues work at [employer], but does not do any significant lifting.   He denies dyspnea, chest discomfort, lightheadedness or pedal edema.  He has had no significant illnesses since his last office visit and no new medical problems.  -  Date:  09/21/2010    Cholesterol: 196    LDL-calculated: 125    HDL: 54    Triglycerides: 84    BG Random: 94    BUN: 13    Creatinine: 0.67    Sodium: 142    Potassium: 4.5    Chloride: 106    CO2 Total: 27    SGOT (AST): 28    SGPT (ALT): 18    T. Bilirubin: 0.6    Alk Phos: 54    Calcium: 9.3    Total Protein: 7    Albumin: 4.7    WBC: 5.2    HGB: 14.8    HCT: 45    PLT: 196    MCV: 93   Current Medications (verified): 1)  Metoprolol Tartrate 25 Mg Tabs (Metoprolol Tartrate) .[name].. 1 Tab Two Times A Day 2)  Warfarin Sodium 5 Mg Tabs (Warfarin Sodium) .[name].. 1 Tab Om Mon,frid 1 1/2 All Other Days 3)  Flomax 0.4 Mg Xr24h-Cap (Tamsulosin Hcl) .[name].. 1 Tab Once Daily 4)  Tylenol Ex St Arthritis Pain 500 Mg Tabs (Acetaminophen) .... As Needed 5)  Lovastatin 20 Mg Tabs (Lovastatin) .... Take 1 Tab Daily  Allergies (verified): No Known Drug Allergies  Comments:  Nurse/Medical Assistant: patient and i reviewed meds Shane [name]  Past History:  PMH, FH, and Social History reviewed and updated.  Review of Systems       See history of present illness.  Vital Signs:  Patient profile:   77 year old male Weight:       201 pounds BMI:     30.67 O2 Sat:      94 % on Room air Pulse rate:   71 / minute BP sitting:   128 / 74  (left arm)  Vitals Entered By: Dreama Saa, CNA (October 01, 2010 2:42 PM)  O2 Flow:  Room air  Physical Exam  General:  Portion of weight and height;well developed, well nourished, in no acute distress. buoyant mood Neck:  No jugular venous distention; normal carotid upstrokes without bruits. Lungs:  Clear bilaterally to auscultation and percussion. Heart:  normal first heart sound; S2 slightly increased in intensity; regular rhythm; minimal systolic murmur Abdomen:  Bowel sounds positive; abdomen soft and non-tender without masses, organomegaly, or hernias noted. No hepatosplenomegaly. Pulses:  distal pulses normal  Extremities: trace ankle edema Neurologic:  Alert and oriented x 3; normal cranial nerves; symmetric strength and tone. Psych:  Normal affect.    Impression & Recommendations:  Problem # 1:  ATHEROSCLEROTIC CV DISEASE-CABG (ICD-429.2) Patient has been asymptomatic in recent years with respect to coronary disease.  Coronary angiography in 2003 demonstrated patent grafts.  Our focus will continue to be optimal management of  risk factors.  Problem # 2:  ATRIAL FLUTTER-PAROXYSMAL (ICD-427.32) Arrhythmia is likely continuing to occur intermittently, but apparently results in no symptoms.  Anticoagulation will need to be maintained indefinitely.  Patient is soon to see a gastroenterologist for screening colonoscopy.  Recent CBC was normal.  There is no evidence for occult GI blood loss.  Problem # 3:  HYPERLIPIDEMIA (ICD-272.4) Most recent lipid profile was suboptimal.  Arrangements have been made by Dr. Ouida Sills to discontinue lovastatin, to start atorvastatin for subsequent serum lipid testing.  CHOL: 219 (12/10/2009)   LDL: 149 (12/10/2009)   HDL: 54 (12/10/2009)   TG: 79 (12/10/2009)  Patient Instructions: 1)  Your physician recommends that you schedule a  follow-up appointment in: 1 year

## 2010-10-29 ENCOUNTER — Encounter (INDEPENDENT_AMBULATORY_CARE_PROVIDER_SITE_OTHER): Payer: MEDICARE

## 2010-10-29 ENCOUNTER — Encounter: Payer: Self-pay | Admitting: Cardiology

## 2010-10-29 DIAGNOSIS — I4892 Unspecified atrial flutter: Secondary | ICD-10-CM

## 2010-10-29 DIAGNOSIS — Z7901 Long term (current) use of anticoagulants: Secondary | ICD-10-CM

## 2010-11-03 NOTE — Medication Information (Signed)
Summary: ccr-lr  Anticoagulant Therapy  Managed by: Vashti Hey, RN PCP: Dr. [name] Supervising MD: Dietrich Pates MD, Molly Maduro Indication 1: Atrial Flutter (ICD-427.32) Lab Used: [facility] [place] Site: [place] INR POC 3.0  Dietary changes: no    Health status changes: no    Bleeding/hemorrhagic complications: no    Recent/future hospitalizations: no    Any changes in medication regimen? no    Recent/future dental: no  Any missed doses?: no       Is patient compliant with meds? yes       Allergies: No Known Drug Allergies  Anticoagulation Management History:      The patient is taking warfarin and comes in today for a routine follow up visit.  Positive risk factors for bleeding include an age of 75 years or older.  The bleeding index is 'intermediate risk'.  Positive CHADS2 values include Age > 73 years old.  The start date was 10/09/2008.  Anticoagulation responsible provider: Dietrich Pates MD, Molly Maduro.  INR POC: 3.0.  Cuvette Lot#: [id number].  Exp: 10/11.    Anticoagulation Management Assessment/Plan:      The patient's current anticoagulation dose is Warfarin sodium 5 mg tabs: 1 tab om mon,frid 1 1/2 all other days.  The target INR is 2 - 3.  The next INR is due 11/26/2010.  Anticoagulation instructions were given to patient.  Results were reviewed/authorized by Vashti Hey, RN.  He was notified by Vashti Hey RN.         Prior Anticoagulation Instructions: INR 2.2 Continue coumadin 7.5mg  once daily except 5mg  on Mondays and Fridays  Current Anticoagulation Instructions: INR 3.0 Continue coumadin 7.5mg  once daily except 5mg  on Mondays and Fridays

## 2010-11-24 ENCOUNTER — Encounter: Payer: Self-pay | Admitting: Cardiology

## 2010-11-24 DIAGNOSIS — I4892 Unspecified atrial flutter: Secondary | ICD-10-CM

## 2010-11-24 DIAGNOSIS — Z7901 Long term (current) use of anticoagulants: Secondary | ICD-10-CM

## 2010-11-26 ENCOUNTER — Ambulatory Visit (INDEPENDENT_AMBULATORY_CARE_PROVIDER_SITE_OTHER): Payer: MEDICARE | Admitting: *Deleted

## 2010-11-26 DIAGNOSIS — I4892 Unspecified atrial flutter: Secondary | ICD-10-CM

## 2010-11-26 DIAGNOSIS — Z7901 Long term (current) use of anticoagulants: Secondary | ICD-10-CM

## 2010-11-26 LAB — POCT INR: INR: 2.3

## 2010-12-17 LAB — PROTIME-INR
INR: 1 (ref 0.00–1.49)
Prothrombin Time: 13.8 seconds (ref 11.6–15.2)

## 2010-12-17 LAB — HEMOGLOBIN AND HEMATOCRIT, BLOOD: HCT: 42.7 % (ref 39.0–52.0)

## 2010-12-24 ENCOUNTER — Ambulatory Visit (INDEPENDENT_AMBULATORY_CARE_PROVIDER_SITE_OTHER): Payer: MEDICARE | Admitting: *Deleted

## 2010-12-24 DIAGNOSIS — Z7901 Long term (current) use of anticoagulants: Secondary | ICD-10-CM

## 2010-12-24 DIAGNOSIS — I4892 Unspecified atrial flutter: Secondary | ICD-10-CM

## 2010-12-24 LAB — POCT INR: INR: 2.7

## 2011-01-19 NOTE — Letter (Signed)
September 09, 2008    Kingsley Callander. Ouida Sills, MD  [address]  [place], [place] [address]   RE:  Shane, [name]  MRN:  [id number]  /  DOB:  1932-11-26   Dear Channing Mutters,   It was my pleasure evaluating Shane [name] in the office today in  consultation at your request for newly diagnosed atrial flutter.  As you  know, this nice gentleman has a long history of coronary artery disease,  having undergone CABG surgery in 20 in [place].  A catheterization  in 2003 revealed critical LAD disease with a patent LIMA graft,, total  obstruction of the right coronary with a patent RIMA graft and no  significant circumflex disease.  LV systolic function was low normal.  He has subsequently done well, but was recently seen in your office and  noted to have pulse irregularity.  EKG showed atrial flutter with a  somewhat increased ventricular response.  He has been started on  metoprolol 25 mg b.i.d. and warfarin.  He was asymptomatic at the time  of his EKG and remains so.   Shane [name] was previously followed by Dr. Dorethea Clan, but has not been seen by  a cardiologist for some years.  His hyperlipidemia was treated with  Vytorin 10/40 mg daily.  His only other medicines are aspirin 81 mg  daily and prednisone 5 mg daily for treatment of polymyalgia rheumatica,  which has developed in recent years.   Past medical history is otherwise notable for nephrolithiasis, BPH, and  a remote lumbar diskectomy.   He reports no drug allergies.   SOCIAL HISTORY:  Works in a Web designer; married with 3 adult  children.   FAMILY HISTORY:  Mother died of myocardial infarction at an advanced  age; of 4 siblings, one has undergone CABG surgery, one has required a  pacemaker.   Review of systems is notable for the need for corrective lenses, prior  cataract surgery bilaterally, a regular diet at home, stable weight and  appetite.  All other systems reviewed and are negative.   PHYSICAL EXAMINATION:  GENERAL:  Pleasant  gentleman in no acute  distress.  VITAL SIGNS:  The weight is 204.  Blood pressure 115/75, heart rate is  75 and regular, respirations 14.  HEENT:  EOMs full; bilateral arcus; normal lids and conjunctivae;  anicteric sclerae; normal oral mucosa.  NECK:  No jugular venous distention; normal carotid upstrokes without  bruits.  ENDOCRINE:  No thyromegaly.  HEMATOPOIETIC:  No adenopathy.  SKIN:  No significant lesions.  LUNGS:  Clear.  CARDIAC:  Normal first and second heart sounds; normal PMI.  ABDOMEN:  Soft and nontender; no masses; no organomegaly.  EXTREMITIES:  No edema; distal pulses intact.  NEUROLOGIC:  Symmetric strength and tone; normal cranial nerves.   EKG:  Relatively classic flutter with 4:1 AV block; nondiagnostic  inferior Q-waves; borderline IVCD.  No prior tracing for comparison.   Recent laboratory includes a normal urinalysis, normal CBC, normal  chemistry profile, and normal PSA.  Lipid profile is good.   IMPRESSION:  Shane [name] is doing well from a symptomatic standpoint.  He  has atrial flutter with a low-to-moderate risk of thromboembolism.  There are no apparent symptoms attributable to his arrhythmia.  I agree  with initial treatment with warfarin although long-term treatment may  not be in his best interest.  His heart rate control is adequate.  We  will check a TSH level and an echocardiogram.  I expect the  results of  both of those studies to be unhelpful.  Subsequent options would include  an antiarrhythmic drug, direct current cardioversion, and radiofrequency  ablation.  I will discuss these options with Shane [name], and we will come  to in agreement as to what the appropriate next step will be.   Thank you so much for sending this nice gentleman back to our practice.    Sincerely,      Gerrit Friends. Dietrich Pates, MD, [facility]  Electronically Signed    RMR/MedQ  DD: 09/09/2008  DT: 09/10/2008  Job #: [id number]

## 2011-01-19 NOTE — Letter (Signed)
October 09, 2008    Kingsley Callander. Ouida Sills, MD  [address]  [place], [place] [address]   RE:  CHANC, Shane [name]  MRN:  [id number]  /  DOB:  Nov 24, 1932   Dear Shane [name] Mutters,   Mr. Bracewell returns to the office for continued assessment and treatment of  atrial flutter with known ischemic heart disease and preserved left  ventricular systolic function.  Since his last visit, he has felt fine.  He notes no chest discomfort, dyspnea, weakness, or malaise.  He has not  detected palpitations.  He was started on Coumadin 3 weeks ago, but has  not yet had an INR.  He suffered minor head trauma this past week, would  he arose suddenly and hit his head on the corner of wooden box.  There  was no loss of consciousness nor evidence for concussion.   CURRENT MEDICATIONS:  1. Metoprolol 25 mg b.i.d.  2. Aspirin 81 mg daily.  3. Warfarin 5 mg daily.  4. Prednisone 5 mg daily.  5. Simvastatin 80 mg daily.   PHYSICAL EXAMINATION:  GENERAL:  Pleasant gentleman in no acute  distress.  VITAL SIGNS:  Weight is 205, stable.  Blood pressure 125/75, heart rate  60 and somewhat irregular.  NECK:  No jugular venous distention; normal carotid upstrokes without  bruits.  LUNGS:  Clear.  CARDIAC:  Normal first and second heart sounds.  ABDOMEN:  Soft and nontender; no organomegaly.  EXTREMITIES:  No edema.   RHYTHM STRIP:  Atrial flutter with predominant 4:1 AV block and a heart  rate of 73.   IMPRESSION:  Mr. Krupka has persistent atrial flutter with no symptoms.  His risk for thromboembolism is low in the absence of hypertension,  congestive heart failure, left ventricular dysfunction, prior  thromboembolism, and diabetes.  His age is fairly advanced, but at least  in the SPAF studies, age was more factor for woman than man.  I advised  him that I would feel comfortable discontinuing warfarin if we proceeded  with a transesophageal echocardiogram and verified the absence of high  risk features.  I also explained  radiofrequency ablation to him and  advised him that a successful procedure, which was highly likely, could  lead to the permanent discontinuation of warfarin without a risk of  thromboembolism.  He is leaning towards that procedure, but would like  to think about this more.  He will call me with a decision.  I will  refer him to Dr. Ladona Ridgel if ablation is agreed upon.   We checked his INR today, which was therapeutic.  We will be happy to  follow him in our Coumadin Clinic.    Sincerely,      Gerrit Friends. Dietrich Pates, MD, [facility]  Electronically Signed    RMR/MedQ  DD: 10/09/2008  DT: 10/10/2008  Job #: [id number]

## 2011-01-19 NOTE — Op Note (Signed)
NAME:  Shane [name], Shane [name] NO.:  [id number]   MEDICAL RECORD NO.:  [id number]          PATIENT TYPE:  OBV   LOCATION:  A316                          FACILITY:  [facility]   PHYSICIAN:  Dennie Maizes, M.D.   DATE OF BIRTH:  02-14-1933   DATE OF PROCEDURE:  11/27/2008  DATE OF DISCHARGE:                               OPERATIVE REPORT   PREOPERATIVE DIAGNOSES:  1. Right upper ureteral calculus with obstruction.  2. Right renal colic.  3. Right hydronephrosis.   POSTOPERATIVE DIAGNOSES:  1. Right upper ureteral calculus with obstruction.  2. Right renal colic.  3. Right hydronephrosis.   OPERATIVE PROCEDURE:  Cystoscopy, retrograde pyelogram, right  ureteroscopy, holmium laser lithotripsy of ureteral stone, right  ureteral stent placement.   ANESTHESIA:  Spinal.   SURGEON:  Dennie Maizes, MD   ESTIMATED BLOOD LOSS:  Minimal.   DRAINS:  6.26 cm size right ureteral stent, 18-French Foley catheter in  the bladder.   BASE OF SETTINGS:  Holmium laser energy 0.6-1.0 joules, rate 8-20 per  second, power 4.8 watts, increased to 20 watts.  Total kV 1.63.   COMPLICATIONS:  Mild urinary extravasation around the upper ureter  possibly due to small ureteral perforation.   INDICATIONS FOR PROCEDURE:  This 77 year old male was evaluated for  right flank pain.  His x-rays revealed a 9-mm size right upper ureteral  calculus obstructing and has a hydronephrosis.  The patient was treated  with extracorporeal shockwave lithotripsy of the right ureteral  calculus.  The stone did not fragment.  The patient was taken to  operating room today.  Cystoscopy, right retrograde pyelogram, right  ureteroscopy, holmium laser lithotripsy, and stone extraction and stent  placement.   DESCRIPTION OF PROCEDURE:  General anesthesia was induced and the  patient was placed on the OR table in the dorsal lithotomy position.  The lower abdomen and genitalia were prepped and draped in a sterile  fashion.  Cystoscopy was done with a 25-French scope.  The urethra was  normal.  There was moderate hypertrophy of the prostate with partial  obstruction of the bladder neck area.  The bladder was found to be  heavily trabeculated.  The trigone, ureteral orifice, and bladder mucosa  were normal.  A 5-French wedge catheter was then placed in the right  ureteral orifice.  A retrograde pyelogram was done with about 7 mL of  Renografin-60.  The distal ureter was normal.  There was large filling  defect in the upper ureter at the level of L3 transverse process  suggestive of a stone.  There was proximal hydroureter or  hydronephrosis.   A 5-French open-ended catheter was the right ureteral orifice.  8 feet  0.133 Bentson guidewire could not be passed beyond the level of the  stone.  A Glidewire could be passed into the upper collecting system.  An open-ended catheter was placed over the Glidewire and pushed above  the level of the stone.  The Glidewire was then exchanged for the  Bentson guidewire.  Distal ureter was dilated using 18-French balloon  dilating catheter 6 cm in  length.  Balloon dilating catheter was then  removed leaving the guidewire in place.  A 45-cm ureteral access sheath  was then placed in the right distal ureter.  Over the guidewire, a 7-  French flexible ureteroscope was inserted to the right ureter.  There  was an area of stricture just distal to the stone.  I could see only  part of the stone through the stricture.  I was unable to dilate the  stricture with the balloon dilating catheter as the stricture would not  admit the balloon dilating catheter.  I was able to see part of the  stone through this narrowed area.  Laser fiber was inserted and the  stone was treated with laser energy, the settings of which had been  described above.  The stone was seen to be fragmented partially.  The  stone started migrating proximally and I was unable to treat the stone  further.   The guidewire came out at this time and I inserted another  guidewire.  An open-ended catheter was then placed over the guidewire  and the contrast was injected.  There was minimal extravasation of the  contrast in the periureteral area proximally due to small perforation of  ureter.  Under direct vision, I passed the guidewire into the upper  ureter.  The open-ended catheter was then advanced to the renal pelvis  and the renal pelvis was opacified.  A Bentson guidewire was then  inserted into the right renal pelvis.  The open-ended catheter was then  removed.  I inserted a 6-French 26-cm size stent into the right  collecting system.  The cystoscope was removed.  A 18-French Foley  catheter was inserted into the bladder.  I plan to observe the patient  in the hospital for 24 hours in view of the mild extravasation of the  contrast.  The patient was transferred to the PACU in a satisfactory  condition.  Estimated blood loss was minimal.      Dennie Maizes, M.D.  Electronically Signed     SK/MEDQ  D:  11/27/2008  T:  11/28/2008  Job:  [id number]   cc:   Kingsley Callander. Ouida Sills, MD  Fax: [phone]

## 2011-01-19 NOTE — Letter (Signed)
October 29, 2008    Kingsley Callander. Ouida Sills, MD  [address]  [place], [place] [address]   RE:  DIAMANTE, RUBIN  MRN:  [id number]  /  DOB:  1932/11/17   Dear Channing Mutters,   Mr. Kise returns to the office for continued assessment and treatment of  atrial flutter.  Since his last visit, he has felt fine.  He is  asymptomatic with respect to his arrhythmia.  His anticoagulation has  now been adjusted and is therapeutic.   Medications are unchanged from his last visit.   PHYSICAL EXAMINATION:  GENERAL:  Pleasant gentleman in no acute  distress.  VITAL SIGNS:  The weight is 208, 3 pounds more than earlier this month.  Blood pressure 120/80, heart rate 80 and regular, and respirations 14  and unlabored.  NECK:  No jugular venous distention.  LUNGS:  Clear.  CARDIAC:  Normal first and second heart sounds; fourth heart sound  present; grade 1/6 early scratchy systolic ejection murmur.  ABDOMEN:  Soft and nontender.  EXTREMITIES:  No edema.   RHYTHM STRIP:  Sinus rhythm; borderline first-degree AV block.   IMPRESSION:  Mr. Almquist has converted to sinus rhythm immediately after  deciding to undergo radiofrequency ablation.  At this point, there is no  urgency to proceed with that option.  We will maintain anticoagulation  for now, continue his current medication and wait for the next episode  of atrial arrhythmia.  I will see him again in 2 months.  If he  continues to be in sinus rhythm, we can consider discontinuation of  warfarin.   Thanks so much for allowing me to continue to participate in the care of  this nice gentleman.    Sincerely,      Gerrit Friends. Dietrich Pates, MD, [facility]  Electronically Signed    RMR/MedQ  DD: 10/29/2008  DT: 10/30/2008  Job #: [id number]

## 2011-01-19 NOTE — Letter (Signed)
December 23, 2008    Kingsley Callander. Ouida Sills, MD  [address]  [place], [place] [address]   RE:  RENDELL, Shane [name]  MRN:  [id number]  /  DOB:  July 26, 1933   Dear Channing Mutters,   Mr. Bates returns to the office as scheduled for continued assessment and  treatment of atrial flutter.  Since his last visit, he has mostly been  troubled with urologic issues.  A few weeks ago, he developed right  flank pain and was found to have recurrent nephrolithiasis.  Lithotripsy  was performed, but did not result in clearing of the stones.  Warfarin  was subsequently stopped and cystoscopy plus laser lithotripsy carried  out.  This also was apparently, for the most part, unsuccessful.  He has  passed one stone spontaneously, but has at least three more in his  ureter.  I was called by his urologist concerning recurrent atrial  flutter, but Mr. Palermo really seems to be asymptomatic with respect to  his cardiac arrhythmias.   Medications are unchanged from his last visit.  His last INR was  therapeutic.   PHYSICAL EXAMINATION:  GENERAL:  Healthy-appearing tanned gentleman in  no acute distress.  VITAL SIGNS:  The weight is 210, 2 pounds more than in February, blood  pressure 110/80, heart rate 68 and irregular, respirations 12 and  unlabored.  NECK:  No jugular venous distention; no carotid bruits.  LUNGS:  Clear.  CARDIAC:  Split first heart sound; normal second heart sound; minimal  systolic murmur.  ABDOMEN:  Soft and nontender; no organomegaly.  EXTREMITIES:  Trace edema.   Mr. Vanwieren walked 500 feet briskly.  His heart rate increased only to 92  beats per minute.   IMPRESSION:  Mr. Bonenberger is doing well from a cardiac standpoint.  I do not  believe that he experiences symptoms related to his arrhythmia.  Accordingly, radiofrequency ablation is not necessary.  He will continue  his current medications and return to see me in 3 months.  He neglected  returned hemoccult cards.  A new set was provided to him.    Sincerely,      Gerrit Friends. Dietrich Pates, MD, [facility]  Electronically Signed    RMR/MedQ  DD: 12/23/2008  DT: 12/24/2008  Job #: [id number]

## 2011-01-19 NOTE — H&P (Signed)
NAME:  Shane [name], Shane [name] NO.:  [id number]   MEDICAL RECORD NO.:  [id number]          PATIENT TYPE:  AMB   LOCATION:  DAY                           FACILITY:  [facility]   PHYSICIAN:  Dennie Maizes, M.D.   DATE OF BIRTH:  February 05, 1933   DATE OF ADMISSION:  11/27/2008  DATE OF DISCHARGE:  LH                              HISTORY & PHYSICAL   CHIEF COMPLAINT:  Right flank pain, right upper ureteral calculus with  obstruction.   HISTORY OF PRESENT ILLNESS:  This 77 year old male experienced  intermittent severe right flank pain associated with mild hematuria for  2 weeks ago.  Evaluation was done with a CT scan of the abdomen and the  pelvis.  This revealed a 7 x 6-mm size right upper ureteral calculus  with obstruction and hydronephrosis.  Two small nonobstructing left  renal calculi were also noted.  The patient has undergone extracorporeal  shock wave lithotripsy of the right upper ureteral calculus on November 20, 2008, at [facility].  Xray KUB revealed nonfragmentation  of the stone.  The patient continues to have intermittent right flank  pain.  He is brought to the short-stay center today for cystoscopy,  retrograde pyelogram, right ureteroscopy, holmium laser lithotripsy and  extraction of right ureteral calculus with stent placement.  He denied  having any fever, chills, voiding difficulty or gross hematuria at  present.   PAST MEDICAL HISTORY:  1. History of elevated cholesterol.  2. Cardiac arrhythmia due to atrial flutter.  3. History of polymyalgia rheumatica.  4. Elevated cholesterol.  5. Status post back surgery in 1973.  6. Status post coronary artery bypass grafting in 1990.   MEDICATION:  1. Warfarin 5 mg p.o. daily which has been stopped for the surgery.  2. Metoprolol 25 mg 1 p.o. b.i.d.  3. Simvastatin 80 mg 1 p.o. daily.  4. Percocet 5/325 one p.o. q.8 h. p.r.n. pain.  5. Prednisone 15 mg 1 p.o. daily   ALLERGIES:  None.   EXAMINATION:  Height 5 feet 8, weight 210 pounds.  HEENT:  Normal.  LUNGS:  Clear to auscultation.  HEART:  Irregular rate and rhythm.  No murmurs.  ABDOMEN:  Is soft.  No palpable flank mass or CVA tenderness.  Bladder  not palpable.  Penis and testes are normal.   IMPRESSION:  Right upper ureteral calculus with obstruction, right  hydronephrosis, post extracorporeal shockwave lithotripsy of right  ureteral calculus, small nonobstructing left renal calculi.   PLAN:  I have discussed with the patient and his family regarding  management options.  Lithotripsy of stone has been done.  The stone had  not been fragmented.  He is scheduled to undergo cystoscopy, retrograde  pyelogram, right ureteroscopy, holmium laser lithotripsy, extraction of  stone and stent placement.  I have informed the patient regarding the  diagnosis, operative details, alternative treatments, outcome, possible  risks and complications, and he has agreed for the procedure to be done.  Inability to access the stone with holmium laser lithotripsy has been  explained to the patient.  He may need  additional procedures.  He has  agreed for the procedure to be done.      Dennie Maizes, M.D.  Electronically Signed     SK/MEDQ  D:  11/27/2008  T:  11/27/2008  Job:  [id number]   cc:   Kingsley Callander. Ouida Sills, MD  Fax: [phone]   [facility]  Fax: [phone]

## 2011-01-21 ENCOUNTER — Ambulatory Visit (INDEPENDENT_AMBULATORY_CARE_PROVIDER_SITE_OTHER): Payer: MEDICARE | Admitting: *Deleted

## 2011-01-21 DIAGNOSIS — Z7901 Long term (current) use of anticoagulants: Secondary | ICD-10-CM

## 2011-01-21 DIAGNOSIS — I4892 Unspecified atrial flutter: Secondary | ICD-10-CM

## 2011-01-21 LAB — POCT INR: INR: 2.3

## 2011-01-22 NOTE — Discharge Summary (Signed)
NAME:  Shane [name], Shane [name]                          ACCOUNT NO.:  [id number]   MEDICAL RECORD NO.:  [id number]                   PATIENT TYPE:  INP   LOCATION:  6533                                 FACILITY:  [facility]   PHYSICIAN:  Gene Serpe, P.A. [facility]                DATE OF BIRTH:  [birth year]   DATE OF ADMISSION:  07/23/2002  DATE OF DISCHARGE:                           DISCHARGE SUMMARY - REFERRING   PROCEDURE:  Coronary angiogram on July 24, 2002.   HISTORY OF PRESENT ILLNESS:  The patient is a 77 year old male, status post  two-vessel coronary artery bypass graft surgery in 1990, at [facility] in [place], [place], who presented to the [facility] Emergency Room with a complaint of crescendo exertional dyspnea  with associated weakness, dizziness, and diaphoresis.  Please refer to the  dictated admission note for the full details.   LABORATORY DATA:  Cardiac enzymes:  Negative total CK levels with a peak MB  fraction of 11.9 (7%), on admission, peak troponin I level 0.06 .  Total  cholesterol 178, triglycerides 77, HDL 59, LDL 104, cholesterol:HDL ratio  3.0.  TSH 0.969.  Normal CBC, normal complete metabolic profile.  Admission chest x-ray:  No active disease.  A CT scan of the chest:  Negative for acute pulmonary embolus.  Small  mediastinal lymph nodes.  (Question reactive.)   HOSPITAL COURSE:  The patient was admitted for the management and evaluation  of unstable angina pectoris.  Serial cardiac enzymes were minimally elevated  with negative total CPK levels.  The patient was involved in the ACTIVITY  trial and was treated with Angiomax.  A coronary angiogram performed by Dr. Veneda Melter on July 24, 2002.  See  the report for full details.  This revealed widely patent LIMA-LAD with 30%  distal LAD, widely patent RIMA-RCA, with no significant distal RCA disease.  Regarding the native arteries, there was a 70% proximal/mid-LAD, a 50%  diagonal-II, a 30% OM-I, a total mid-RCA.  LV function was preserved with an  ejection fraction of greater than 55%.  Dr. Chales Abrahams recommended medical management.  He felt that the etiology of the  patient's significant dyspnea and diaphoresis were unclear, noting the  stable coronary anatomy.   MEDICATION ADJUSTMENTS RECOMMENDED THIS ADMISSION:  By Dr. Chales Abrahams, were the  substitution of Zocor with 40 mg of Lipitor for aggressive lipid-lowering  therapy.  The LDL as noted was 104.  The plan is to have repeat lipid and  liver profile in approximately six  weeks.   DISPOSITION:  The patient was cleared for discharge in a hemodynamically  stable condition on hospital day number two.   DISCHARGE MEDICATIONS:  1. Lipitor 40 mg q.d.  (new).  2. Coated aspirin 325 mg q.d.  3. Cardura 4 mg q.h.s.  4. Nitrostat 0.4 mg p.r.n. instructions.   INSTRUCTIONS:  No heavy lifting or  driving x2 days.  Call the office if  there is any swelling or bleeding in the groin.   DIET:  A low-fat, low-cholesterol diet.   FOLLOW UP:  The patient is scheduled to follow up with Dr. Kingsley Callander. Fagan next  week.  The patient is scheduled to follow up with Dr. Chales Abrahams on Monday,  September 10, 2002, at 2:15 p.m.  He will need a follow-up fasting lipid/liver  profile at that time.   DISCHARGE DIAGNOSES:  1. Stable coronary artery disease     a. Patent left internal mammary artery to left anterior descending        coronary artery graft, and right internal mammary artery to right        coronary artery graft.     b. Normal left ventricular function.     c. Status post two-vessel coronary artery bypass graft surgery in 1990,        at [facility] in [place], [place].  2. Dyslipidemia.  3. Remote tobacco use.  4. Benign prostatic hypertrophy.                                               Gene Serpe, P.A. [facility]    GS/MEDQ  D:  07/25/2002  T:  07/25/2002  Job:  [id number]   cc:   Kingsley Callander. Ouida Sills, M.D.  [address]  [place]  [place] [address]  Fax: [phone]

## 2011-01-22 NOTE — Discharge Summary (Signed)
NAME:  Shane [name], PROM NO.:  [id number]   MEDICAL RECORD NO.:  [id number]          PATIENT TYPE:  INP   LOCATION:  A214                          FACILITY:  [facility]   PHYSICIAN:  Kingsley Callander. Ouida Sills, MD       DATE OF BIRTH:  Jul 19, 1933   DATE OF ADMISSION:  01/31/2005  DATE OF DISCHARGE:  05/30/2006LH                                 DISCHARGE SUMMARY   DISCHARGE DIAGNOSES:  1.  Chest pain.  2.  Coronary artery disease, status post coronary artery bypass graft.  3.  Hyperlipidemia.  4.  Nephrolithiasis.  5.  Benign prostatic hypertrophy.   DISCHARGE MEDICATIONS:  1.  Lovastatin 40 mg daily.  2.  Aspirin 325 mg daily.   HOSPITAL COURSE:  This patient is a 77 year old white male with a history of  coronary artery disease and bypass surgery in 1990, who presented with chest  pain.  His EKG showed no signs of acute ischemia.  Cardiac markers were  negative.  He was treated with Lovenox.  There were no arrhythmias on  cardiac monitoring.  He was originally treated with IV nitroglycerin, which  was then weaned.  He underwent an exercise Myoview study, which revealed a  small inferior/lateral scar.  There was no sign of ischemia.  His ejection  fraction was estimated at 54%.  He was seen in consultation by Dr. Dorethea Clan.   He remained painful.  He was stable for discharge on the evening of May  30th.  He will be seen in followup in the office.       ROF/MEDQ  D:  02/02/2005  T:  02/02/2005  Job:  [id number]

## 2011-01-22 NOTE — Procedures (Signed)
NAME:  DAMONTAY, ALRED NO.:  [id number]   MEDICAL RECORD NO.:  [id number]          PATIENT TYPE:  INP   LOCATION:  A214                          FACILITY:  [facility]   PHYSICIAN:  Vida Roller, M.D.   DATE OF BIRTH:  May 20, 1933   DATE OF PROCEDURE:  01/31/2005  DATE OF DISCHARGE:                                    STRESS TEST   PROCEDURE:  Exercise Myoview.   HISTORY:  Ms Vosler is a 77 year old gentleman with coronary artery disease  status post coronary artery bypass grafting in 1990.  Catheterization in  November 2003 revealed a patent graft and normal EF.  He now presents with  atypical chest discomfort.  He has had one set of cardiac enzymes that are  negative for acute myocardial infarction.  He has had 2 sets of point-of-  care markers that are negative for acute myocardial infarction.   BASELINE DATA:  ECG reveals a sinus rhythm at 64 beats per minute with  nonspecific ST abnormalities, inferior Q waves noted.  Blood pressure is  132/80.   The patient exercised for a total of 10 minutes 34 seconds to Bruce protocol  stage IV and 12.8 METS.  Maximum heart rate was 155 beats per minute with  130 predicted maximum.  Blood pressure was 188/78.  The patient denied any  chest discomfort or shortness of breath.  Exercise was stopped secondary to  fatigue.  ECG revealed no arrhythmias.  No ischemic changes were noted.  Final images and results are pending MD review.      AB/MEDQ  D:  02/02/2005  T:  02/02/2005  Job:  [id number]

## 2011-01-22 NOTE — Procedures (Signed)
[facility]  Patient:    Shane [name], Shane [name] Visit Number: [id number] MRN: [id number]          Service Type: END Location: DAY Attending Physician:  Jonathon Bellows Dictated by:   [name], M.D. Proc. Date: 08/22/01 Admit Date:  08/21/2001 Discharge Date: 08/21/2001                                Stress Test  PROCEDURE:  Cardiolite stress test  CARDIOLOGIST:  [name], M.D.  DESCRIPTION OF PROCEDURE:  [name] exercised for 11 minutes (two minutes into stage 4 of the Bruce protocol), and attained a maximal heart rate of 160 (104% of the age-predicted maximal heart rate), at a work load of 12.9 METS, and discontinued exercise due to fatigue.  There were no symptoms of chest pain. There were no arrhythmias.  There were no ST-segment changes diagnostic of ischemia.  The baseline electrocardiogram revealed normal sinus rhythm, inferior infarction.  IMPRESSION:  No evidence of exercise-induced ischemia.  Cardiolite image is pending. Dictated by:   [name], M.D. Attending Physician:  Jonathon Bellows DD:  08/22/01 TD:  08/22/01 Job: [id number]

## 2011-01-22 NOTE — Cardiovascular Report (Signed)
NAME:  Shane [name], Shane [name] NO.:  [id number]   MEDICAL RECORD NO.:  [id number]                   PATIENT TYPE:  INP   LOCATION:  6533                                 FACILITY:  [facility]   PHYSICIAN:  Veneda Melter, M.D. [facility]               DATE OF BIRTH:  05/02/1933   DATE OF PROCEDURE:  07/25/2002  DATE OF DISCHARGE:                              CARDIAC CATHETERIZATION   PROCEDURES PERFORMED:  1. Left heart catheterization.  2. Left ventriculogram.  3. Selective coronary angiography.  4. Selective angiography of right and left internal mammary bypass grafts.   DIAGNOSES:  1. Native two-vessel coronary artery disease.  2. Normal left ventricular systolic function.  3. Patent mammary bypass grafts.   HISTORY:  The patient is a 77 year old white male with a history of coronary  artery disease who has undergone two-vessel coronary artery bypass graft  surgery in 1990 in [place], [place].  The patient has done well since and  underwent stress imaging study in 10/02 which showed normal LV function and  no focal ischemia.  Unfortunately, the patient has had a several-month  history of intermittent episodes of shortness of breath and diaphoresis.  He  also had an episode of chest discomfort recently.  He recently presented  with a crescendo dyspnea and diaphoresis with exertion and was admitted to  the hospital.  He subsequently had slight elevation of cardiac MB fraction  and is referred for further assessment.   PROCEDURE:  An informed consent was obtained, and the patient was brought to  the catheterization lab.  A 6-French sheath was placed in the right femoral  artery using the modified Seldinger technique.  A 6-French pigtail catheter  was then advanced to the left ventricle, and a left ventriculogram performed  using power injections of contrast.  Subsequently, 6-French JL4 and JR4  catheters were then used to engage the left and right coronary arteries,  and  selective angiography in the reversed projections using manual injections of  contrast.  The JL4 catheter was then placed in the right subclavian artery;  however, this could be not be adequately engaged with the RIMA graft, and  using an exchange wire, an internal mammary bypass diagnostic catheter was  placed in the right subclavian artery.  This was used to engage the RIMA  graft to the distal right coronary artery, and again selective angiography  was performed.  The IM diagnostic catheter was then placed in the left  subclavian artery and used to engage the LIMA graft to the LAD.  Selective  angiography was then performed using manual injections of contrast.  The  exchange catheter and sheath were removed.  Manual pressure was applied  until adequate hemostasis was achieved.  The patient tolerated the procedure  well and was transferred to the floor in stable condition.   FINDINGS:  1. Left main trunk.  Medium caliber vessel with  mild ________.  2. LAD.  This is a medium caliber vessel which supplies a trivial first     diagonal branch proximal segment, medium caliber second diagonal branch     thereafter.  The LAD then extends to the apex.  The LAD has moderate     disease, 50% of the proximal left segment, which then extends into a     narrowing of 70% encompassing the trivial diagonal branch.  The distal     LAD has mild irregularities and is seen to fill predominantly via the     LIMA graft.  There is mid narrowing of 30%.  The second diagonal branch     has an ostial narrowing of 50% and fills predominantly via antegrade     flow.  3. Left circumflex artery.  This is a medium caliber vessel that supplies a     small first marginal branch and the proximal segment a larger second     marginal branch.  In the mid-section, there is moderate narrowing of 30-     40% of the proximal segment of the second marginal branch.  4. Right coronary artery.  This was a dominant medium  caliber vessel that     supplies the posterior descending artery and a posterior ventricular     branch in its terminal segment. The right coronary artery is 100%     occluded in the mid-section.  The distal vessel fills the in situ RIMA     graft anastomosed to the distal right coronary artery.  The posterior     descending artery and the posterior ventricular branch have mild _______     of 30%.  5. RIMA to the distal right coronary artery is patent.  This was an in situ     graft.  6. LIMA to the LAD is patent.  This was also an in situ graft.  7. Left ventricle.  Normal end-systolic and end-diastolic dimensions.     Normal left ventricular function is well preserved.  Ejection fraction 50-     55%.  No mitral regurgitation.  LV pressure is 120/5.  Aortic is 120/65.     LVEDP is 15.   ASSESSMENT AND PLAN:  The patient is a 77 year old gentleman with two-vessel  coronary artery disease that is well revascularized surgically.  He has well-  preserved LV function.  The only concern is the second diagonal branch which  is compromised by moderate disease in the proximal and mid-LAD.  This does  not appear to be critical in nature; however, this disease is presumptively  the reason the patient underwent bypass surgery.  In addition, the amount of  myocardium supplied by this diagonal branch is relatively small and is  unlikely to elicit the symptoms the patient presented with.  We will thus  pursue a conservative course of medical therapy should the patient have  recurrent symptoms or an abnormal stress imaging study.  Percutaneous  intervention may be considered to improve flow to the second diagonal  branch.                                               Veneda Melter, M.D. [facility]    NG/MEDQ  D:  07/24/2002  T:  07/25/2002  Job:  [id number]   cc:   Kingsley Callander. Ouida Sills, M.D.  [address]  [address]  [place]  [place] [address]  Fax: [phone]

## 2011-01-22 NOTE — H&P (Signed)
NAME:  Shane [name], Shane [name] NO.:  [id number]   MEDICAL RECORD NO.:  [id number]                   PATIENT TYPE:  EMS   LOCATION:  MAJO                                 FACILITY:  [facility]   PHYSICIAN:  Veneda Melter, M.D. [facility]               DATE OF BIRTH:  1933/07/12   DATE OF ADMISSION:  07/23/2002  DATE OF DISCHARGE:                                HISTORY & PHYSICAL   CHIEF COMPLAINT:  Weakness and shortness of breath.   HISTORY OF PRESENT ILLNESS:  The patient is a 77 year old  white male with a  known history of coronary artery disease who suffered a myocardial  infarction in 1986 and subsequently underwent a two-vessel coronary artery  bypass graft surgery in 1990 in [place], [place].  The patient has been in  a good state of health without symptoms until approximately five months ago,  when he noted brief episodes of chest pain with exertion. This soreness  lasted two to three days and was subsequently some activity when estimated  by the patient.  Over the past five to six weeks, he has noted some increase  in dyspnea on exertion to the point that he has to stop while walking and  rest before proceeding. Today at approximately 10:30 a.m. while doing yard  work, he noted profound onset of weakness and diaphoresis, and dizziness.  This resolved after approximately two hours with some rest, and then when he  started working again, he noted recurrence of symptoms. He also noted  palpitations and a fast heart rate after profuse episodes of diaphoresis. He  also had tachy palpitations approximately four to five weeks ago that awoke  him in the middle of the night and these lasted only a few minutes and he  had no associated symptoms at that time. He had no chest pain with shortness  of breath today with the weakness and diaphoresis.   He denies any abdominal pain, no nausea or vomiting, no bright red blood per  rectum, no hematuria or dysuria. He does have  rare episodes of reflux and  arthralgias. He also reports an upper respiratory infection approximately a  week ago. The review of systems is otherwise noncontributory.   PAST MEDICAL HISTORY:  1. Coronary artery disease, status post myocardial infarction in 1986 with     subsequent coronary artery bypass graft surgery in 1990. The patient     reports negative Cardiolite stress test in the summer of 2002. His last     visit with the cardiologist was in [place] in 1995, none since.  2. History of dyslipidemia.  3. Benign prostatic hypertrophy.  4. History of back surgery.  5. Cataracts.   ALLERGIES:  None.   CURRENT MEDICATIONS:  1. Zocor 20 mg q.d.  2. Cardura 4 mg q.d.  3. Aspirin 325 q.d.   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 He currently works part time doing yard work. He  lives in [place] for the past two and a half years with his wife. He has  a history of tobacco use of 30 pack years, quit in 1984. Denies alcohol use.   FAMILY HISTORY:  His mother died at the age of 32, father died at the age of  27, both of coronary artery disease. He has one brother with coronary artery  disease as well.   PHYSICAL EXAMINATION:  GENERAL:  He is a well developed, well nourished  white male in no acute distress.  VITAL SIGNS:  Temperature 97.0, blood pressure 94/68, respirations 18,  initial heart rate on presentation to the ER was 138, currently 59. O2  saturation 95% on room air.  HEENT:  Pupils equally round and reactive to light. Extraocular muscles  intact. Oropharynx  with no lesions.  NECK:  Supple, without bruits, no lymphadenopathy.  HEART:  Regular rate and rhythm without murmurs.  LUNGS:  Clear to auscultation.  ABDOMEN:  Soft, nontender.  EXTREMITIES:  No edema. Peripheral pulses 2+ and equal bilaterally.  NEUROLOGIC:  Motor strength 5/5. Sensory intact to touch.   LABORATORY DATA:  An ECG shows normal sinus  rhythm at 126 beats per minute.  There  is first degree AV block, Q waves are noted in the inferolateral  leads, no acute ischemic changes.   White count 9.3, hemoglobin 14.7, hematocrit 44.9, platelets 251. Sodium  143, potassium 4.4, chloride 111, bicarbonate 25, BUN 22, creatinine 1.1,  glucose 82. Initial CK 171, MB 11.9, troponin I 0.02.   ASSESSMENT AND PLAN:  The patient is a 77 year old gentleman who is 13 years  status post coronary artery bypass graft surgery, who presents with a  history of crescendo dyspnea on exertion, now culminating in profound  weakness, dizziness and diaphoresis while performing yard work. The patient  had persistence of symptoms and tachy palpitations prior to his presentation  in the emergency room. Currently he is without symptoms and hemodynamically  stable. Our concern is for progression of coronary artery disease and  underlying ischemia triggering his symptoms and tachy palpitations.  Alternatively he could have primary dysrhythmia such as atrial fibrillation  as a cause of his symptoms, although it sounds more like he becomes week and  then has palpitations. He has no signs or symptoms of congestive failure and  no sharp chest discomfort or shortness of breath at rest. This suggests  pulmonary embolus.   At this point we will admit the patient to the hospital for telemetry  monitoring. We will rule out acute myocardial infarction with serial cardiac  enzymes. It would be most prudent at this point to proceed with cardiac  catheterization to define his anatomy and to determine if further treatment  of his coronary disease is necessary. The risks, benefits and alternatives  of cardiac catheterization and possible intervention were discussed with the  patient and his wife and they understand and agreed to proceed.                                               Veneda Melter, M.D. [facility]   NG/MEDQ  D:  07/23/2002  T:  07/23/2002  Job:  [id number]   cc:    Kingsley Callander. Ouida Sills, M.D.  [address]  [place]  [place] [address]  Fax: [phone]

## 2011-01-22 NOTE — Op Note (Signed)
[facility]  Patient:    Shane [name], Shane [name] Visit Number: [id number] MRN: [id number]          Service Type: END Location: DAY Attending Physician:  Jonathon Bellows Dictated by:   Roetta Sessions, M.D. Proc. Date: 08/21/01 Admit Date:  08/21/2001   CC:         [name], M.D.   Operative Report  INDICATIONS FOR PROCEDURE:  The patient is a 77 year old Caucasian male for colorectal cancer screening.  He has not had any prior imaging of his lower GI tract.  He has no symptoms.  There is no family history of colorectal neoplasia.  Colonoscopy is now being done as a standard screening maneuver. The approach has been discussed with Shane [name] at length at the bedside. Potential risks, benefits and alternatives, have been reviewed.  Questions answered.  He is agreeable.  He is low risk for conscious sedation with Versed and Demerol.  PROCEDURE IN DETAIL:  O2 saturation and blood pressure pulses were monitored throughout the entire procedure.  Conscious sedation.  Versed 3 mg, IV Demerol 75 mg IV in divided doses throughout the procedure.  The patient received atropine 0.5 mg IV prior to the procedure for asymptomatic bradycardia in the 50s.  INSTRUMENT:  Olympus video chip colonoscope.  FINDINGS:  Digital rectal examination revealed no abnormalities.  ENDOSCOPIC FINDINGS:  The prep was good in the rectum and colon.  EXAMINATION:  Examination of the rectal mucosa including a retroflexed view revealed no abnormalities.  COLON:  The colonic mucosa was surveyed from the rectosigmoid junction through the left transverse right colon to the area of the appendiceal orifice and ileocecal valve and cecum.  The patient was noted to have a left sided diverticulum.  The remainder of the colonic mucosa appeared normal.  The cecum, ileocecal valve, and appendiceal orifice were well seen and photographed for the record.  From this level the scope was slowly withdrawn  and all previously imaged mucosa surfaces were again seen.  Again, no other abnormalities were observed.  The patient tolerated the procedure well.  IMPRESSION:  Normal rectum. 2. Left-sided diverticulum.  Remainder of colonic mucosa appeared normal.  RECOMMENDATIONS: 1. Diverticulosis, literature provided to Shane [name]. 2. Follow up with Dr. Ouida Sills. 3. Shane [name] should consider having a repeat colonoscopy in 10 years. Dictated by:   Roetta Sessions, M.D. Attending Physician:  Jonathon Bellows DD:  08/21/01 TD:  08/21/01 Job: [id number]

## 2011-01-22 NOTE — Consult Note (Signed)
NAME:  Shane [name], Shane [name] NO.:  [id number]   MEDICAL RECORD NO.:  [id number]          PATIENT TYPE:  INP   LOCATION:  A214                          FACILITY:  [facility]   PHYSICIAN:  Vida Roller, M.D.   DATE OF BIRTH:  03/29/1933   DATE OF CONSULTATION:  02/02/2005  DATE OF DISCHARGE:                                   CONSULTATION   HISTORY OF PRESENT ILLNESS:  Mr. Willden is a 77 year old man with past medical  history significant for coronary artery disease status post bypass surgery  in 1990 with a heart catheterization in November, 2003 revealing widely  patent graft, normal LV function.  He was admitted on May 28 complaining of  discomfort in his chest.  He stated he had about 45 minutes of substernal  discomfort while at church that was not related to activity.  It was very  atypical for his previous discomfort in his chest.  He is a relatively  active, vigorous man who has not had any discomfort in his chest since his  bypass surgery and this discomfort lasted about 45 minutes, resolved prior  to having any medical therapy performed and he is currently feeling  comfortable without any complaints.   PAST MEDICAL HISTORY:  Significant for coronary artery disease.  He has  status post bypass surgery which was done in [place], [place].  He had  significant disease in his left anterior descending as well right coronary  artery and he received a RIMA to his right coronary artery and a heart  catheterization in November, 2003 for complications of palpitations.  At  that time, he had significant disease in his left anterior descending  coronary artery, nonobstructive disease in his circumflex, and an occluded  right coronary artery.  The LIMA to the LAD was widely patent.  The RIMA to  the LAD was widely patent.  They were both in-situ grafts.  His ejection  fraction at that time was 50-55%.  He has hyperlipidemia, benign prostatic  hypertrophy, a history of kidney  stones, and he has had a lumbar diskectomy  back in the 70s.   SOCIAL HISTORY:  Lives in [place].  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  He is married, has several  children.  He used to smoke cigarettes but quit back in 1998 prior to his  heart attack.  He does not use any illicit drugs.   CURRENT MEDICATIONS:  1.  Mevacor 40 mg once a day.  2.  Aspirin 325 mg once a day.   FAMILY HISTORY:  His mother died at age 23 of coronary artery disease,  father died at age 53 of unknown causes.  He has one brother who has a  history of coronary artery disease, one brother with Crohn's disease, one  sister who is healthy.   REVIEW OF SYSTEMS:  He denies any fever or chills.  No headaches.  No sinus  discharge, no vertigo.  Denies any rash, denies any shortness of breath,  dyspnea on exertion, PND, or orthopnea, lower  extremity edema, palpitations,  presyncope, no wheezing.  Denies any urinary frequency, urgency, or dysuria.  He does occasionally have to strain to urinate due to his BPH.  He denies  any myalgias or arthralgias, nausea, vomiting, diarrhea, bright red blood  per rectum, melena, GERD symptoms or abdominal pain.  Denies any polyuria,  polydipsia, heat or cold intolerance and the remainder of his review of  systems is negative.   PHYSICAL EXAMINATION:  GENERAL: He is well-developed, well-nourished, white  man who looks significantly younger than his stated age.  VITAL SIGNS: He is afebrile, his pulse is 73, his blood pressure is 98/52,  and his respiratory rate is 20.  He is saturating 96% on 2 L nasal cannula.  HEENT: Unremarkable.  NECK: Supple.  There is no jugular venous distention or carotid bruits.  CHEST: He has some inspiratory crackles at the right base but otherwise are  clear.  CARDIOVASCULAR: Regular.  He has a normal first and second heart sound.  There is no third or fourth heart sound, no murmurs appreciated.  PMI is  not  displaced.  He has no lifts or thrills.  The respiratory movement is normal.  ABDOMEN: Soft, nontender, normoactive bowel sounds.  EXTREMITIES: Lower extremities are without clubbing, cyanosis, or edema.  There are 2+ pulses throughout.  GENITOURINARY, RECTAL, BREAST: Exams are all deferred.  NEUROLOGIC/MUSCULOSKELETAL: Exams are normal.   Chest x-ray shows previous bypass, no congestive heart failure, no  pneumonia.  Electrocardiogram shows sinus rhythm at a rate of 62 with normal  intervals and a normal axis.  He has Q-waves in the inferior leads  consistent with his old inferior wall myocardial infarction.  He has  nonspecific ST-T wave changes essentially unchanged from his EKG from 2003.   LABORATORIES:  White blood cell count is 6.6, H&H of 14.5 and 43.  Platelet  count of 253.  Sodium 137, potassium 3.8, chloride 106, bicarb 25, BUN 20,  creatinine 0.8.  His blood sugar is 117.  Two sets of point-of-care enzymes  are negative, one set of regular cardiac enzymes are negative.  His fasting  lipids from yesterday show a total cholesterol of 171, triglycerides of 128.  HDL 42, LDL 103.   ASSESSMENT:  1.  So, this is a gentleman with chest discomfort which is very atypical for      coronary disease without any EKG changes or enzyme bump even though the      discomfort was more than 30 minutes.  He does, however, have known      coronary artery disease and is status post bypass surgery.  2.  Hyperlipidemia.  His LDL cholesterol is slightly above goal.  I think      for a gentleman who has coronary disease of the significance that he      does, an HDL less than or equal to 70 would be a more reasonable target.   My plan then is to arrange for him to have an exercise Myoview today.  We  will recommend to increase his Zocor to 40 mg once a day and we will see him  after the Myoview is completed to discuss whether further therapy is needed.      JH/MEDQ  D:  02/02/2005  T:   02/02/2005  Job:  [id number]   cc:   Kingsley Callander. Ouida Sills, MD  [address]  [place]  [place] [address]  Fax: [phone]

## 2011-01-22 NOTE — H&P (Signed)
NAME:  JEFFORY, SNELGROVE NO.:  [id number]   MEDICAL RECORD NO.:  [id number]          PATIENT TYPE:  EMS   LOCATION:  ED                            FACILITY:  [facility]   PHYSICIAN:  Melvyn Novas, MDDATE OF BIRTH:  05-07-1933   DATE OF ADMISSION:  01/31/2005  DATE OF DISCHARGE:  LH                                HISTORY & PHYSICAL   The patient is a 77 year old white male status post CABG times two to the  left and right coronary artery with a right and left internal mammary artery  in 1990.  He had a cath in 2003 revealing patent internal mammary arteries  and normal LV function. 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  However, in church today  he had an epigastric, retrosternal aching sensation which persisted for 2-3  minutes.  This was not associated with any dyspnea, diaphoresis,  palpitations, dizziness, syncope or nausea.  The patient then got in the car  and had two subsequent episodes on the way to the hospital, each persisting  for 2-3 minutes.  Again no other cardiac symptomatology was associated here.  He was seen in the ER and was hemodynamically stable and pain free.  He was  admitted for vigilance, antiplatelet therapy, presuming this could be  angina, and we will schedule a Cardiolite scan if cardiac enzymes continue  to be negative.   PAST MEDICAL HISTORY:  Significant for coronary artery disease, benign  prostatic hypertrophy, hyperlipidemia, nephrolithiasis, status post MI.   PAST SURGICAL HISTORY:  Remarkable for the aforementioned CABG times two in  1990 and a diskectomy 30 years ago in the lumbosacral spine.   CURRENT MEDICATIONS:  1.  Mevacor 40 a day.  2.  Aspirin 325 a day.   He smoked for 30 years, three packs per day.  He quit in 1989.  He works as  a Administrator.  He is married.  He has several children.   PHYSICAL EXAMINATION:  VITAL SIGNS:  Temperature is 98.7, pulse 60  and  regular, respiratory rate 18.  O2 saturation is 99%.  Blood pressure is  128/73.  HEAD:  Normocephalic, atraumatic.  EYES:  PERRLA.  Extraocular movements are intact.  Sclerae are clear.  Conjunctivae are pink.  NECK:  Shows no JVD, no carotid bruits, no thyromegaly or thyroid bruits.  LUNGS:  Clear to A&P. No rales, wheezes or rhonchi.  HEART:  Regular rhythm.  S1, S2 of normal intensity.  No S3, S4, gallops.  No heaves, thrills or rubs.  ABDOMEN:  Soft, nontender.  Bowel sounds are normoactive.  No guarding,  rebound, masses or megaly.  EXTREMITIES:  No clubbing, cyanosis or edema.  NEUROLOGIC:  Cranial nerves II-XII are grossly intact.  The patient moves  all four extremities.   IMPRESSION:  1.  Recurrent epigastric pain, three episodes.  2.  Status post coronary artery bypass graft times three with right and left      internal mammary artery in 1990.  3.  Normal left ventricular function in  2003 with patent coronary arteries.  4.  Hyperlipidemia.   PLAN:  To admit, place on aspirin and Lovenox empirically, IV nitro for  antiischemic therapy.  We will do serial cardiac enzymes and schedule a  Cardiolite scan in 36 hours if enzymes are negative.  I will make further  recommendations as the database expands.  We will order a lipid profile in  the morning.      RMD/MEDQ  D:  01/31/2005  T:  01/31/2005  Job:  [id number]

## 2011-02-18 ENCOUNTER — Ambulatory Visit (INDEPENDENT_AMBULATORY_CARE_PROVIDER_SITE_OTHER): Payer: Medicare Other | Admitting: *Deleted

## 2011-02-18 DIAGNOSIS — I4892 Unspecified atrial flutter: Secondary | ICD-10-CM

## 2011-02-18 DIAGNOSIS — Z7901 Long term (current) use of anticoagulants: Secondary | ICD-10-CM

## 2011-02-18 LAB — POCT INR: INR: 2.2

## 2011-03-17 ENCOUNTER — Encounter: Payer: Medicare Other | Admitting: *Deleted

## 2011-03-19 ENCOUNTER — Ambulatory Visit (INDEPENDENT_AMBULATORY_CARE_PROVIDER_SITE_OTHER): Payer: Medicare Other | Admitting: *Deleted

## 2011-03-19 DIAGNOSIS — Z7901 Long term (current) use of anticoagulants: Secondary | ICD-10-CM

## 2011-03-19 DIAGNOSIS — I4892 Unspecified atrial flutter: Secondary | ICD-10-CM

## 2011-03-22 ENCOUNTER — Encounter: Payer: Medicare Other | Admitting: *Deleted

## 2011-04-15 ENCOUNTER — Ambulatory Visit (INDEPENDENT_AMBULATORY_CARE_PROVIDER_SITE_OTHER): Payer: Medicare Other | Admitting: *Deleted

## 2011-04-15 DIAGNOSIS — Z7901 Long term (current) use of anticoagulants: Secondary | ICD-10-CM

## 2011-04-15 DIAGNOSIS — I4892 Unspecified atrial flutter: Secondary | ICD-10-CM

## 2011-05-13 ENCOUNTER — Encounter: Payer: Medicare Other | Admitting: *Deleted

## 2011-05-19 ENCOUNTER — Ambulatory Visit (INDEPENDENT_AMBULATORY_CARE_PROVIDER_SITE_OTHER): Payer: Medicare Other | Admitting: *Deleted

## 2011-05-19 DIAGNOSIS — I4892 Unspecified atrial flutter: Secondary | ICD-10-CM

## 2011-05-19 DIAGNOSIS — Z7901 Long term (current) use of anticoagulants: Secondary | ICD-10-CM

## 2011-06-16 ENCOUNTER — Ambulatory Visit (INDEPENDENT_AMBULATORY_CARE_PROVIDER_SITE_OTHER): Payer: Medicare Other | Admitting: *Deleted

## 2011-06-16 DIAGNOSIS — Z7901 Long term (current) use of anticoagulants: Secondary | ICD-10-CM

## 2011-06-16 DIAGNOSIS — I4892 Unspecified atrial flutter: Secondary | ICD-10-CM

## 2011-07-14 ENCOUNTER — Ambulatory Visit (INDEPENDENT_AMBULATORY_CARE_PROVIDER_SITE_OTHER): Payer: Medicare Other | Admitting: *Deleted

## 2011-07-14 DIAGNOSIS — I4892 Unspecified atrial flutter: Secondary | ICD-10-CM

## 2011-07-14 DIAGNOSIS — Z7901 Long term (current) use of anticoagulants: Secondary | ICD-10-CM

## 2011-08-25 ENCOUNTER — Ambulatory Visit (INDEPENDENT_AMBULATORY_CARE_PROVIDER_SITE_OTHER): Payer: Medicare Other | Admitting: *Deleted

## 2011-08-25 DIAGNOSIS — Z7901 Long term (current) use of anticoagulants: Secondary | ICD-10-CM

## 2011-08-25 DIAGNOSIS — I4892 Unspecified atrial flutter: Secondary | ICD-10-CM

## 2011-08-25 LAB — POCT INR: INR: 2.4

## 2011-10-06 ENCOUNTER — Encounter: Payer: Self-pay | Admitting: Cardiology

## 2011-10-06 DIAGNOSIS — E785 Hyperlipidemia, unspecified: Secondary | ICD-10-CM | POA: Insufficient documentation

## 2011-10-06 DIAGNOSIS — I2510 Atherosclerotic heart disease of native coronary artery without angina pectoris: Secondary | ICD-10-CM | POA: Insufficient documentation

## 2011-10-06 DIAGNOSIS — I4892 Unspecified atrial flutter: Secondary | ICD-10-CM | POA: Insufficient documentation

## 2011-10-06 DIAGNOSIS — Z7901 Long term (current) use of anticoagulants: Secondary | ICD-10-CM | POA: Insufficient documentation

## 2011-10-07 ENCOUNTER — Encounter: Payer: Self-pay | Admitting: Cardiology

## 2011-10-07 ENCOUNTER — Ambulatory Visit (INDEPENDENT_AMBULATORY_CARE_PROVIDER_SITE_OTHER): Payer: Medicare Other | Admitting: Cardiology

## 2011-10-07 ENCOUNTER — Ambulatory Visit (INDEPENDENT_AMBULATORY_CARE_PROVIDER_SITE_OTHER): Payer: Medicare Other | Admitting: *Deleted

## 2011-10-07 DIAGNOSIS — I4892 Unspecified atrial flutter: Secondary | ICD-10-CM

## 2011-10-07 DIAGNOSIS — I2510 Atherosclerotic heart disease of native coronary artery without angina pectoris: Secondary | ICD-10-CM

## 2011-10-07 DIAGNOSIS — E785 Hyperlipidemia, unspecified: Secondary | ICD-10-CM

## 2011-10-07 DIAGNOSIS — R030 Elevated blood-pressure reading, without diagnosis of hypertension: Secondary | ICD-10-CM | POA: Insufficient documentation

## 2011-10-07 DIAGNOSIS — Z7901 Long term (current) use of anticoagulants: Secondary | ICD-10-CM

## 2011-10-07 DIAGNOSIS — E782 Mixed hyperlipidemia: Secondary | ICD-10-CM

## 2011-10-07 LAB — POCT INR: INR: 2.5

## 2011-10-07 MED ORDER — LOVASTATIN 40 MG PO TABS: 80.0000 mg | ORAL_TABLET | Freq: Every day | ORAL | Status: DC

## 2011-10-07 NOTE — Assessment & Plan Note (Signed)
Blood pressure is well controlled under treatment with beta blocker alone.

## 2011-10-07 NOTE — Assessment & Plan Note (Signed)
Patient has done amazingly well with no cardiovascular issues since CABG surgery nearly 1/4 of a century ago.  We will continue to monitor symptoms and to optimally treat cardiovascular risk factors.

## 2011-10-07 NOTE — Patient Instructions (Signed)
**Note De-Identified Shane [name] Obfuscation** Your physician has recommended you make the following change in your medication: increase Mevacor to 80 mg daily (take two 40 mg tablets daily)  Your physician recommends that you return for lab work in: 1 month  Your physician recommends that you complete 3 hemoccult cards and return them to this office.  Your physician recommends that you schedule a follow-up appointment in: 1 year

## 2011-10-07 NOTE — Progress Notes (Signed)
Patient ID: Shane [name], male   DOB: 1933/04/28, 78 y.o.   MRN: [id number] HPI: Scheduled return visit for this very nice and feisty older gentleman with paroxysmal atrial flutter, hypertension and coronary artery disease.  Since his last visit, he has done superbly.  He denies all cardiopulmonary symptoms, but remains active, walking approximately 4 miles per day.  Recent laboratory was excellent except for a mildly suboptimal lipid profile.  He has developed no new medical problems nor required any urgent medical treatment.  Prior to Admission medications   Medication Sig Start Date End Date Taking? Authorizing Provider  acetaminophen (TYLENOL EX ST ARTHRITIS PAIN) 500 MG tablet Take 500 mg by mouth as needed.     Yes Historical Provider, MD  lovastatin (MEVACOR) 20 MG tablet Take 20 mg by mouth daily.     Yes Historical Provider, MD  metoprolol (LOPRESSOR) 25 MG tablet Take 25 mg by mouth 2 (two) times daily.     Yes Historical Provider, MD  Tamsulosin HCl (FLOMAX) 0.4 MG CAPS Take 0.4 mg by mouth daily.     Yes Historical Provider, MD  warfarin (COUMADIN) 5 MG tablet Take 5 mg by mouth daily. Take 1 tablet on Mon. & Fri. 1 1/2 all other days as directed by Anticoagulation clinic.    Yes Historical Provider, MD    No Known Allergies    Past medical history, social history, and family history reviewed and updated.  ROS: Denies orthopnea, PND, palpitations, lightheadedness, syncope, dyspnea on exertion or chest discomfort.  PHYSICAL EXAM: BP 136/70  Pulse 61  Resp 16  Ht 5\' 8"  (1.727 m)  Wt 92.08 kg (203 lb)  BMI 30.87 kg/m2  General-Well developed; no acute distress Body habitus-mildly overweight  Neck-No JVD; no carotid bruits Lungs-clear lung fields; resonant to percussion; median sternotomy scar Cardiovascular-normal PMI; normal S1 and S2; regular rhythm; minimal systolic murmur at the cardiac base Abdomen-normal bowel sounds; soft and non-tender without masses or  organomegaly Musculoskeletal-No deformities, no cyanosis or clubbing Neurologic-Normal cranial nerves; symmetric strength and tone Skin-Warm, no significant lesions Extremities-distal pulses intact; trace edema  EKG: Tracing performed 10/04/11 obtained and reviewed.  Normal sinus rhythm with left atrial abnormality and 1st degree AV block; possible prior inferior MI; minor nonspecific ST-T wave  abnormality.  No previous tracing for comparison.  ASSESSMENT AND PLAN:  [place] Bing, MD 10/07/2011 3:34 PM

## 2011-10-07 NOTE — Assessment & Plan Note (Signed)
Patient is doing well with Coumadin therapy.  INRs have been stable and therapeutic.  There is no evidence for bleeding, but CBC and stool for Hemoccult testing will be monitored to exclude occult GI blood loss.  Patient is due for a colonoscopy and will schedule an appointment.

## 2011-10-07 NOTE — Assessment & Plan Note (Signed)
Patient remains asymptomatic with uncertain occurrence of atrial flutter.  I doubt that this rhythm has resolved entirely, but in the absence of symptoms, the percentage of time he spends in sinus is unknown.  Patient's wife inquired as to whether warfarin can be discontinued.  The subject has been discussed in the past, but was reviewed.  Risk of thromboembolism is not terribly high for Shane [name], but is increased due to age and hypertension.  Consideration could be given to substitution of aspirin and accepting the risk of an event or further risk stratification with an event recorder and transesophageal echocardiography.  Another option would be to perform ablation to potentially cure his arrhythmia.  Patient wishes to avoid significant additional testing and procedures and elects to continue current therapy.

## 2011-10-07 NOTE — Assessment & Plan Note (Signed)
Recent lipid profile somewhat suboptimal.  Dose of lovastatin will be increased to 80 mg per day with a repeat lipid profile in one month.

## 2011-10-12 ENCOUNTER — Encounter: Payer: Self-pay | Admitting: *Deleted

## 2011-10-18 ENCOUNTER — Other Ambulatory Visit: Payer: Self-pay

## 2011-10-18 ENCOUNTER — Encounter (INDEPENDENT_AMBULATORY_CARE_PROVIDER_SITE_OTHER): Payer: Medicare Other | Admitting: *Deleted

## 2011-10-18 DIAGNOSIS — Z7901 Long term (current) use of anticoagulants: Secondary | ICD-10-CM

## 2011-10-18 LAB — POC HEMOCCULT BLD/STL (HOME/3-CARD/SCREEN): Card #2 Fecal Occult Blod, POC: NEGATIVE

## 2011-10-19 ENCOUNTER — Encounter: Payer: Self-pay | Admitting: *Deleted

## 2011-10-27 ENCOUNTER — Other Ambulatory Visit: Payer: Self-pay | Admitting: *Deleted

## 2011-10-27 DIAGNOSIS — E782 Mixed hyperlipidemia: Secondary | ICD-10-CM

## 2011-11-01 ENCOUNTER — Other Ambulatory Visit: Payer: Self-pay | Admitting: *Deleted

## 2011-11-01 DIAGNOSIS — E782 Mixed hyperlipidemia: Secondary | ICD-10-CM

## 2011-11-04 ENCOUNTER — Encounter: Payer: Self-pay | Admitting: *Deleted

## 2011-11-04 ENCOUNTER — Telehealth: Payer: Self-pay | Admitting: *Deleted

## 2011-11-06 LAB — LIPID PANEL
Cholesterol: 197 mg/dL (ref 0–200)
LDL Cholesterol: 130 mg/dL — ABNORMAL HIGH (ref 0–99)
Total CHOL/HDL Ratio: 3.9 Ratio
VLDL: 16 mg/dL (ref 0–40)

## 2011-11-08 ENCOUNTER — Other Ambulatory Visit: Payer: Self-pay | Admitting: *Deleted

## 2011-11-08 ENCOUNTER — Telehealth: Payer: Self-pay | Admitting: Cardiology

## 2011-11-08 DIAGNOSIS — E782 Mixed hyperlipidemia: Secondary | ICD-10-CM

## 2011-11-08 MED ORDER — ATORVASTATIN CALCIUM 80 MG PO TABS: 80.0000 mg | ORAL_TABLET | Freq: Every day | ORAL | Status: DC

## 2011-11-08 NOTE — Telephone Encounter (Signed)
Patient wants to know what his Cholesterol reading was and why they are changing meds. / tg

## 2011-11-08 NOTE — Telephone Encounter (Signed)
Clarified readings and rationale.  Verbalized understanding.

## 2011-11-18 ENCOUNTER — Ambulatory Visit (INDEPENDENT_AMBULATORY_CARE_PROVIDER_SITE_OTHER): Payer: Medicare Other | Admitting: *Deleted

## 2011-11-18 DIAGNOSIS — I4892 Unspecified atrial flutter: Secondary | ICD-10-CM

## 2011-11-18 DIAGNOSIS — Z7901 Long term (current) use of anticoagulants: Secondary | ICD-10-CM

## 2011-11-29 ENCOUNTER — Other Ambulatory Visit: Payer: Self-pay | Admitting: *Deleted

## 2011-11-29 DIAGNOSIS — E782 Mixed hyperlipidemia: Secondary | ICD-10-CM

## 2011-12-10 ENCOUNTER — Encounter: Payer: Self-pay | Admitting: *Deleted

## 2011-12-25 ENCOUNTER — Other Ambulatory Visit: Payer: Self-pay | Admitting: Cardiology

## 2011-12-26 ENCOUNTER — Encounter: Payer: Self-pay | Admitting: Cardiology

## 2011-12-26 LAB — LIPID PANEL
Cholesterol: 124 mg/dL (ref 0–200)
LDL Cholesterol: 67 mg/dL (ref 0–99)
Triglycerides: 40 mg/dL (ref <150)
VLDL: 8 mg/dL (ref 0–40)

## 2011-12-27 ENCOUNTER — Other Ambulatory Visit: Payer: Self-pay | Admitting: *Deleted

## 2011-12-27 MED ORDER — ATORVASTATIN CALCIUM 80 MG PO TABS: 80.0000 mg | ORAL_TABLET | Freq: Every day | ORAL | Status: DC

## 2011-12-29 ENCOUNTER — Ambulatory Visit (INDEPENDENT_AMBULATORY_CARE_PROVIDER_SITE_OTHER): Payer: Medicare Other | Admitting: *Deleted

## 2011-12-29 DIAGNOSIS — Z7901 Long term (current) use of anticoagulants: Secondary | ICD-10-CM

## 2011-12-29 DIAGNOSIS — I4892 Unspecified atrial flutter: Secondary | ICD-10-CM

## 2012-02-09 ENCOUNTER — Ambulatory Visit (INDEPENDENT_AMBULATORY_CARE_PROVIDER_SITE_OTHER): Payer: Medicare Other | Admitting: *Deleted

## 2012-02-09 DIAGNOSIS — I4892 Unspecified atrial flutter: Secondary | ICD-10-CM

## 2012-02-09 DIAGNOSIS — Z7901 Long term (current) use of anticoagulants: Secondary | ICD-10-CM

## 2012-02-09 LAB — POCT INR: INR: 2.8

## 2012-03-22 ENCOUNTER — Ambulatory Visit (INDEPENDENT_AMBULATORY_CARE_PROVIDER_SITE_OTHER): Payer: Medicare Other | Admitting: *Deleted

## 2012-03-22 DIAGNOSIS — I4892 Unspecified atrial flutter: Secondary | ICD-10-CM

## 2012-03-22 DIAGNOSIS — Z7901 Long term (current) use of anticoagulants: Secondary | ICD-10-CM

## 2012-03-22 LAB — POCT INR: INR: 1.8

## 2012-04-12 ENCOUNTER — Ambulatory Visit (INDEPENDENT_AMBULATORY_CARE_PROVIDER_SITE_OTHER): Payer: Medicare Other | Admitting: *Deleted

## 2012-04-12 DIAGNOSIS — Z7901 Long term (current) use of anticoagulants: Secondary | ICD-10-CM

## 2012-04-12 DIAGNOSIS — I4892 Unspecified atrial flutter: Secondary | ICD-10-CM

## 2012-04-12 LAB — POCT INR: INR: 1.8

## 2012-05-10 ENCOUNTER — Ambulatory Visit (INDEPENDENT_AMBULATORY_CARE_PROVIDER_SITE_OTHER): Payer: Medicare Other | Admitting: *Deleted

## 2012-05-10 DIAGNOSIS — I4892 Unspecified atrial flutter: Secondary | ICD-10-CM

## 2012-05-10 DIAGNOSIS — Z7901 Long term (current) use of anticoagulants: Secondary | ICD-10-CM

## 2012-06-07 ENCOUNTER — Ambulatory Visit (INDEPENDENT_AMBULATORY_CARE_PROVIDER_SITE_OTHER): Payer: Medicare Other | Admitting: *Deleted

## 2012-06-07 DIAGNOSIS — Z7901 Long term (current) use of anticoagulants: Secondary | ICD-10-CM

## 2012-06-07 DIAGNOSIS — I4892 Unspecified atrial flutter: Secondary | ICD-10-CM

## 2012-06-07 LAB — POCT INR: INR: 2.6

## 2012-07-19 ENCOUNTER — Ambulatory Visit (INDEPENDENT_AMBULATORY_CARE_PROVIDER_SITE_OTHER): Payer: Medicare Other | Admitting: *Deleted

## 2012-07-19 ENCOUNTER — Ambulatory Visit (INDEPENDENT_AMBULATORY_CARE_PROVIDER_SITE_OTHER): Payer: Medicare Other | Admitting: Adult Health

## 2012-07-19 ENCOUNTER — Encounter: Payer: Self-pay | Admitting: Adult Health

## 2012-07-19 VITALS — BP 110/60 | HR 59 | Ht 67.5 in | Wt 176.4 lb

## 2012-07-19 DIAGNOSIS — I7090 Unspecified atherosclerosis: Secondary | ICD-10-CM

## 2012-07-19 DIAGNOSIS — I4892 Unspecified atrial flutter: Secondary | ICD-10-CM

## 2012-07-19 DIAGNOSIS — E785 Hyperlipidemia, unspecified: Secondary | ICD-10-CM

## 2012-07-19 DIAGNOSIS — Z7901 Long term (current) use of anticoagulants: Secondary | ICD-10-CM

## 2012-07-19 DIAGNOSIS — R030 Elevated blood-pressure reading, without diagnosis of hypertension: Secondary | ICD-10-CM

## 2012-07-19 DIAGNOSIS — I2510 Atherosclerotic heart disease of native coronary artery without angina pectoris: Secondary | ICD-10-CM

## 2012-07-19 LAB — POCT INR: INR: 2.3

## 2012-07-19 NOTE — Assessment & Plan Note (Signed)
He offers no complaints of angina symptoms. He remains very active. We will not plan any cardiac testing at this time.

## 2012-07-19 NOTE — Progress Notes (Signed)
HPI: [name] is a very pleasant 78 year old patient of Dr. Dietrich Pates that he follows for ongoing assessment treatment of paroxysmal atrial flutter, is followed in our lab our [facility], he also has a history of hypertension and coronary artery disease, with coronary artery bypass grafting in 1987. The patient is very active walking between 4-5 miles a day. Last visit Dr. Dietrich Pates was encouraged by his progress, suggested weight loss would be helpful in lowering his cholesterol, and for him to continue his exercise regimen. Since that time the patient has lost 33 pounds, feels good, and is happy with his energy level. He is followed by Dr. Ouida Sills for lab work, and is due to have followup cholesterol studies in one month. The patient denies any recurrent discomfort in his chest, intermittent claudication symptoms, myalgias, or dyspnea on exertion. He states that he was unhappy with his weight and wanted to do something about it. He placed himself on a 1000-calorie diet and increased his walking to an hour and a half a day.  No Known Allergies  Current Outpatient Prescriptions  Medication Sig Dispense Refill  . acetaminophen (TYLENOL EX ST ARTHRITIS PAIN) 500 MG tablet Take 500 mg by mouth as needed.        [name] atorvastatin (LIPITOR) 80 MG tablet Take 1 tablet (80 mg total) by mouth daily.  90 tablet  3  . metoprolol (LOPRESSOR) 25 MG tablet Take 25 mg by mouth 2 (two) times daily.        . Tamsulosin HCl (FLOMAX) 0.4 MG CAPS Take 0.4 mg by mouth daily.        [name] warfarin (COUMADIN) 5 MG tablet Take 5 mg by mouth daily. Take 1 tablet on Mon. & Fri. 1 1/2 all other days as directed by Anticoagulation clinic.         Past Medical History  Diagnosis Date  . Arteriosclerotic cardiovascular disease (ASCVD)     CABG surgery in 1990. MI in 1986; coronary angiography in 2003-critical LAD with patent LIMA; total obstruction of the RCA with patent RIMA; low normal EF.  [name] Hyperlipidemia    Lipid profile in 09/2010:196, 84, 54, 125  . Atrial flutter, paroxysmal     asymptomatic; onset in 2010; 4:1 AVB with low-dose metoprolol; moderate left      atrial enlargement and mild left ventricular hypertrophy with normal ejection fraction by echocardiography  . Chronic anticoagulation   . Benign prostatic hypertrophy   . Nephrolithiasis   . Polymyalgia rheumatica     Past Surgical History  Procedure Date  . Coronary artery bypass graft 1990  . Lumbar disc surgery     NWG:NFAOZH of systems complete and found to be negative unless listed above PHYSICAL EXAM BP 110/60  Pulse 59  Ht 5' 7.5" (1.715 m)  Wt 176 lb 6.4 oz (80.015 kg)  BMI 27.22 kg/m2  General: Well developed, well nourished, in no acute distress Head: Eyes PERRLA, No xanthomas.   Normal cephalic and atramatic  Lungs: Clear bilaterally to auscultation and percussion. Heart: HRRR S1 S2, without MRG.  Pulses are 2+ & equal.            No carotid bruit. No JVD.  No abdominal bruits. No femoral bruits. Abdomen: Bowel sounds are positive, abdomen soft and non-tender without masses or                  Hernia's noted. Msk:  Back normal, normal gait. Normal strength and tone for age. Extremities:  No clubbing, cyanosis or edema.  DP +1 Neuro: Alert and oriented X 3. Psych:  Good affect, responds appropriately  EKG: Sinus bradycardia with lst degree AV block. PR .24ms. Rate of 59 bpm.   ASSESSMENT AND PLAN

## 2012-07-19 NOTE — Assessment & Plan Note (Signed)
This is followed by Dr. Ouida Sills. He is currently on Lipitor 80 mg daily. Dose adjustments will be completed based upon labs. With his 33 pound weight loss, he may have significant improvement in his cholesterol status, and may be able to have a lower dose of statin. We will defer to Dr. Ouida Sills concerning dosing of  statin

## 2012-07-19 NOTE — Patient Instructions (Addendum)
Your physician wants you to follow-up in: 6 months with Dr. Rothbart. You will receive a reminder letter in the mail two months in advance. If you don't receive a letter, please call our office to schedule the follow-up appointment.  

## 2012-07-19 NOTE — Assessment & Plan Note (Signed)
Excellent control of blood pressure on 1 antihypertensive medication metoprolol 25 mg twice a day. The patient is down from 136/70-110/60 with his weight loss. I consider decreasing his metoprolol to 25 mg daily, but he is tolerating his blood pressure without dizziness and his heart rate is well-controlled. We will not make any changes at this time. He will followup with Dr. Dietrich Pates in 6 months, medication adjustments may be considered at that time should he continue to lose weight, and if he becomes mildly hypotensive. No labs will be completed at this time as he is due to have them in one month with his primary care physician.

## 2012-07-19 NOTE — Assessment & Plan Note (Signed)
He is currently in normal sinus rhythm, sinus bradycardia with a first-degree AV block. PR interval 0.24. He is asymptomatic with the bradycardia, although this is not significantly low. Consideration for decreasing metoprolol to 25 mg daily, can be readdressed on followup appointment should he become symptomatic or significantly bradycardic. He has no complaints of recurrence of irregular heart rhythm. He will continue on Coumadin therapy with dosing per Coumadin clinic. On recent office visit with Dr. Dietrich Pates, there was a discussion about removing the Coumadin and placing him on aspirin daily. Dr. Dietrich Pates felt that his risk was low at this time for CVA. The patient wishes to continue the Coumadin and tell followup appointment with Dr. Dietrich Pates, or Dr. Ouida Sills.

## 2012-08-18 ENCOUNTER — Ambulatory Visit: Payer: Medicare Other | Admitting: Cardiology

## 2012-08-21 ENCOUNTER — Ambulatory Visit: Payer: Medicare Other | Admitting: Cardiology

## 2012-09-11 ENCOUNTER — Ambulatory Visit (INDEPENDENT_AMBULATORY_CARE_PROVIDER_SITE_OTHER): Payer: Medicare Other | Admitting: *Deleted

## 2012-09-11 DIAGNOSIS — Z7901 Long term (current) use of anticoagulants: Secondary | ICD-10-CM

## 2012-09-11 DIAGNOSIS — I4892 Unspecified atrial flutter: Secondary | ICD-10-CM

## 2012-10-17 ENCOUNTER — Encounter: Payer: Self-pay | Admitting: Cardiology

## 2012-10-23 ENCOUNTER — Ambulatory Visit (INDEPENDENT_AMBULATORY_CARE_PROVIDER_SITE_OTHER): Payer: Medicare Other | Admitting: *Deleted

## 2012-10-23 DIAGNOSIS — I4892 Unspecified atrial flutter: Secondary | ICD-10-CM

## 2012-10-23 DIAGNOSIS — Z7901 Long term (current) use of anticoagulants: Secondary | ICD-10-CM

## 2012-10-25 ENCOUNTER — Telehealth: Payer: Self-pay

## 2012-10-25 NOTE — Telephone Encounter (Signed)
Pt referred by Dr. Ouida Sills for screening colonoscopy. Last one was 08/21/2001 and next was due in 10 years.   Called and LMOM to call.

## 2012-10-30 NOTE — Telephone Encounter (Signed)
Pt was referred for colonoscopy. Last one was 08/21/2001 by RMR. On coumadin. Ov with Lorenza Burton, NP on 11/20/2012 at 2:30 PM.

## 2012-11-16 ENCOUNTER — Encounter: Payer: Self-pay | Admitting: Internal Medicine

## 2012-11-20 ENCOUNTER — Ambulatory Visit: Payer: Medicare Other | Admitting: Gastroenterology

## 2012-11-20 ENCOUNTER — Ambulatory Visit: Payer: Medicare Other | Admitting: Urgent Care

## 2012-12-04 ENCOUNTER — Ambulatory Visit (INDEPENDENT_AMBULATORY_CARE_PROVIDER_SITE_OTHER): Payer: Medicare Other | Admitting: *Deleted

## 2012-12-04 DIAGNOSIS — Z7901 Long term (current) use of anticoagulants: Secondary | ICD-10-CM

## 2012-12-04 DIAGNOSIS — I4892 Unspecified atrial flutter: Secondary | ICD-10-CM

## 2013-01-05 ENCOUNTER — Telehealth: Payer: Self-pay | Admitting: *Deleted

## 2013-01-05 NOTE — Telephone Encounter (Signed)
Noted incoming fax from [place] to request pt Atorvastatin 80mg , pt was notified to clarify pharmacy was correct, pt advised that he was supposed to have a lower dosage of the statin per last OV, advised we will research and contact pt, noted the OV notation on 07-19-12 as follows:  This is followed by Dr. Ouida Sills. He is currently on Lipitor 80 mg daily. Dose adjustments will be completed based upon labs. With his 33 pound weight loss, he may have significant improvement in his cholesterol status, and may be able to have a lower dose of statin. We will defer to Dr. Ouida Sills concerning dosing of statin      The pt was not advised at this OV to have lipid panel drawn, no recent labs noted in epic, please advise if pt needs to have labs repeated or f/u only with PCP per note

## 2013-01-05 NOTE — Telephone Encounter (Signed)
It is my understanding that Dr.Fagan was planning to draw the labs for cholesterol status. If he has not drawn them, please order for our evaluation and dosing of statin

## 2013-01-05 NOTE — Telephone Encounter (Signed)
.  left message to have patient return my call with wife, she advised to call back Monday after 2pm when the pt is off from work, advised wife we will be seeing pt at that time and the call will come later around 4pm, wife understood

## 2013-01-11 NOTE — Telephone Encounter (Signed)
Pt advised that he is still taking 80mg  of atorvastatin, pt noted he has CPE in January 2014 and his cholesterol was better than it has been in years, called [facility] office for copy to be faxed to our office, once available will have KL review and make changes if nessacary, pt aware that we will call him back only if adjustments need to be made, pt understood

## 2013-01-15 ENCOUNTER — Encounter: Payer: Self-pay | Admitting: Adult Health

## 2013-01-18 ENCOUNTER — Ambulatory Visit: Payer: Medicare Other | Admitting: Cardiology

## 2013-01-24 ENCOUNTER — Ambulatory Visit (INDEPENDENT_AMBULATORY_CARE_PROVIDER_SITE_OTHER): Payer: Medicare Other | Admitting: *Deleted

## 2013-01-24 DIAGNOSIS — Z7901 Long term (current) use of anticoagulants: Secondary | ICD-10-CM

## 2013-01-24 DIAGNOSIS — I4892 Unspecified atrial flutter: Secondary | ICD-10-CM

## 2013-01-24 NOTE — Telephone Encounter (Signed)
Labs placed on KL desk, no changes documented noted

## 2013-01-31 ENCOUNTER — Ambulatory Visit (INDEPENDENT_AMBULATORY_CARE_PROVIDER_SITE_OTHER): Payer: Medicare Other | Admitting: Cardiology

## 2013-01-31 ENCOUNTER — Encounter: Payer: Self-pay | Admitting: Cardiology

## 2013-01-31 VITALS — BP 105/67 | HR 67 | Ht 67.5 in | Wt 178.0 lb

## 2013-01-31 DIAGNOSIS — I4892 Unspecified atrial flutter: Secondary | ICD-10-CM

## 2013-01-31 DIAGNOSIS — R030 Elevated blood-pressure reading, without diagnosis of hypertension: Secondary | ICD-10-CM

## 2013-01-31 DIAGNOSIS — E785 Hyperlipidemia, unspecified: Secondary | ICD-10-CM

## 2013-01-31 DIAGNOSIS — Z7901 Long term (current) use of anticoagulants: Secondary | ICD-10-CM

## 2013-01-31 DIAGNOSIS — I7090 Unspecified atherosclerosis: Secondary | ICD-10-CM

## 2013-01-31 DIAGNOSIS — I2510 Atherosclerotic heart disease of native coronary artery without angina pectoris: Secondary | ICD-10-CM

## 2013-01-31 NOTE — Progress Notes (Deleted)
Name: Shane [name]    DOB: 1933/09/02  Age: 79 y.o.  MR#: [id number]       PCP:  [name], MD      Insurance: Payor: [employer] [place] [employer] / Plan: BLUE MEDICARE / Product Type: *No Product type* /   CC:   No chief complaint on file. BOTTLES  VS Filed Vitals:   01/31/13 1451  BP: 105/67  Pulse: 67  Height: 5' 7.5" (1.715 m)  Weight: 178 lb (80.74 kg)    Weights Current Weight  01/31/13 178 lb (80.74 kg)  07/19/12 176 lb 6.4 oz (80.015 kg)  10/07/11 203 lb (92.08 kg)    Blood Pressure  BP Readings from Last 3 Encounters:  01/31/13 105/67  07/19/12 110/60  10/07/11 136/70     Admit date:  (Not on file) Last encounter with RMR:  01/18/2013   Allergy Review of patient's allergies indicates no known allergies.  Current Outpatient Prescriptions  Medication Sig Dispense Refill  . acetaminophen (TYLENOL EX ST ARTHRITIS PAIN) 500 MG tablet Take 500 mg by mouth as needed.        [name] atorvastatin (LIPITOR) 80 MG tablet Take 1 tablet (80 mg total) by mouth daily.  90 tablet  3  . metoprolol (LOPRESSOR) 25 MG tablet Take 25 mg by mouth 2 (two) times daily.        . Tamsulosin HCl (FLOMAX) 0.4 MG CAPS Take 0.4 mg by mouth daily.        [name] warfarin (COUMADIN) 5 MG tablet Take 5 mg by mouth daily. Take 1 tablet on Mon. & Fri. 1 1/2 all other days as directed by Anticoagulation clinic.        No current facility-administered medications for this visit.    Discontinued Meds:   There are no discontinued medications.  Patient Active Problem List   Diagnosis Date Noted  . Borderline hypertension 10/07/2011  . Arteriosclerotic cardiovascular disease (ASCVD)   . Hyperlipidemia   . Chronic anticoagulation   . Atrial flutter, paroxysmal   . NEPHROLITHIASIS 03/26/2009  . Polymyalgia rheumatica 03/26/2009  . BENIGN PROSTATIC HYPERTROPHY, HX OF 03/26/2009    LABS    Component Value Date/Time   NA 142 09/21/2010   NA 141 09/03/2009   K 4.5 09/21/2010   K 4.6 09/03/2009    CL 106 09/21/2010   CL 105 09/03/2009   CO2 27 09/21/2010   CO2 22 09/03/2009   GLUCOSE 94 09/21/2010   GLUCOSE 90 09/03/2009   BUN 13 09/21/2010   BUN 16 09/03/2009   CREATININE 0.67 09/21/2010   CREATININE 0.64 09/03/2009   CALCIUM 9.3 09/21/2010   CALCIUM 9.4 09/03/2009   CMP     Component Value Date/Time   NA 142 09/21/2010   K 4.5 09/21/2010   CL 106 09/21/2010   CO2 27 09/21/2010   GLUCOSE 94 09/21/2010   BUN 13 09/21/2010   CREATININE 0.67 09/21/2010   CALCIUM 9.3 09/21/2010   PROT 7 09/21/2010   ALBUMIN 4.7 09/21/2010   AST 28 09/21/2010   ALT 18 09/21/2010   ALKPHOS 54 09/21/2010   BILITOT 0.6 09/21/2010       Component Value Date/Time   WBC 5.2 09/21/2010   WBC 5.7 12/10/2009 1838   WBC 5.7 12/10/2009   WBC 5.7 12/10/2009   HGB 14.8 09/21/2010   HGB 14.8 12/10/2009 1838   HGB 14.8 12/10/2009   HGB 14.8 12/10/2009   HCT 45 09/21/2010   HCT 46.7 12/10/2009  1838   HCT 46.7 12/10/2009   HCT 46.7 12/10/2009   MCV 93 09/21/2010   MCV 93.6 12/10/2009 1838   MCV 93.6 12/10/2009   MCV 93.6 12/10/2009    Lipid Panel     Component Value Date/Time   CHOL 124 12/25/2011 0800   TRIG 40 12/25/2011 0800   HDL 49 12/25/2011 0800   CHOLHDL 2.5 12/25/2011 0800   VLDL 8 12/25/2011 0800   LDLCALC 67 12/25/2011 0800   LDLCALC 125 09/21/2010    ABG No results found for this basename: phart, pco2, pco2art, po2, po2art, hco3, tco2, acidbasedef, o2sat     No results found for this basename: TSH   BNP (last 3 results) No results found for this basename: PROBNP,  in the last 8760 hours Cardiac Panel (last 3 results) No results found for this basename: CKTOTAL, CKMB, TROPONINI, RELINDX,  in the last 72 hours  Iron/TIBC/Ferritin No results found for this basename: iron, tibc, ferritin     EKG Orders placed in visit on 07/19/12  . EKG 12-LEAD     Prior Assessment and Plan Problem List as of 01/31/2013   NEPHROLITHIASIS   Polymyalgia rheumatica   BENIGN PROSTATIC HYPERTROPHY, HX OF   Arteriosclerotic  cardiovascular disease (ASCVD)   Last Assessment & Plan   07/19/2012 Office Visit Written 07/19/2012  3:17 PM by Jodelle Gross, NP     He offers no complaints of angina symptoms. He remains very active. We will not plan any cardiac testing at this time.    Hyperlipidemia   Last Assessment & Plan   07/19/2012 Office Visit Written 07/19/2012  3:16 PM by Jodelle Gross, NP     This is followed by Dr. Ouida Sills. He is currently on Lipitor 80 mg daily. Dose adjustments will be completed based upon labs. With his 33 pound weight loss, he may have significant improvement in his cholesterol status, and may be able to have a lower dose of statin. We will defer to Dr. Ouida Sills concerning dosing of  statin    Chronic anticoagulation   Last Assessment & Plan   10/07/2011 Office Visit Written 10/07/2011  4:35 PM by Kathlen Brunswick, MD     Patient is doing well with Coumadin therapy.  INRs have been stable and therapeutic.  There is no evidence for bleeding, but CBC and stool for Hemoccult testing will be monitored to exclude occult GI blood loss.  Patient is due for a colonoscopy and will schedule an appointment.    Atrial flutter, paroxysmal   Last Assessment & Plan   07/19/2012 Office Visit Written 07/19/2012  3:15 PM by Jodelle Gross, NP     He is currently in normal sinus rhythm, sinus bradycardia with a first-degree AV block. PR interval 0.24. He is asymptomatic with the bradycardia, although this is not significantly low. Consideration for decreasing metoprolol to 25 mg daily, can be readdressed on followup appointment should he become symptomatic or significantly bradycardic. He has no complaints of recurrence of irregular heart rhythm. He will continue on Coumadin therapy with dosing per Coumadin clinic. On recent office visit with Dr. Dietrich Pates, there was a discussion about removing the Coumadin and placing him on aspirin daily. Dr. Dietrich Pates felt that his risk was low at this time for CVA. The  patient wishes to continue the Coumadin and tell followup appointment with Dr. Dietrich Pates, or Dr. Ouida Sills.    Borderline hypertension   Last Assessment & Plan   07/19/2012 Office Visit Written 07/19/2012  3:13 PM by Jodelle Gross, NP     Excellent control of blood pressure on 1 antihypertensive medication metoprolol 25 mg twice a day. The patient is down from 136/70-110/60 with his weight loss. I consider decreasing his metoprolol to 25 mg daily, but he is tolerating his blood pressure without dizziness and his heart rate is well-controlled. We will not make any changes at this time. He will followup with Dr. Dietrich Pates in 6 months, medication adjustments may be considered at that time should he continue to lose weight, and if he becomes mildly hypotensive. No labs will be completed at this time as he is due to have them in one month with his primary care physician.        Imaging: No results found.

## 2013-01-31 NOTE — Assessment & Plan Note (Signed)
No documentation of atrial flutter in recent years. Consideration can be given to event recording and discontinuation of warfarin if negative. I would be inclined to wait for an inexpensive long-term monitor to be introduced into clinical practice to allow for more prolonged assessment of rhythm. Metoprolol at low dose will be continued for now to prevent excessive tachycardia. Anticoagulation will be maintained.

## 2013-01-31 NOTE — Patient Instructions (Addendum)
Your physician recommends that you schedule a follow-up appointment in: ONE YEAR 

## 2013-01-31 NOTE — Progress Notes (Signed)
Patient ID: Shane [name], male   DOB: 08-05-1933, 79 y.o.   MRN: [id number]  HPI: Schedule return visit for continued assessment and treatment of coronary artery disease and cardiovascular risk factors. Patient is now nearly 25 years out from CABG with no subsequent cardiac problems. He remains active and asymptomatic. His developed no new medical problems nor has he required urgent medical care over the past year.  Current Outpatient Prescriptions  Medication Sig Dispense Refill  . acetaminophen (TYLENOL EX ST ARTHRITIS PAIN) 500 MG tablet Take 500 mg by mouth as needed.        [name] atorvastatin (LIPITOR) 80 MG tablet Take 1 tablet (80 mg total) by mouth daily.  90 tablet  3  . metoprolol (LOPRESSOR) 25 MG tablet Take 25 mg by mouth 2 (two) times daily.        . Tamsulosin HCl (FLOMAX) 0.4 MG CAPS Take 0.4 mg by mouth daily.        [name] warfarin (COUMADIN) 5 MG tablet Take 5 mg by mouth daily. Take 1 tablet on Mon. & Fri. 1 1/2 all other days as directed by Anticoagulation clinic.        No current facility-administered medications for this visit.   No Known Allergies   Past medical history, social history, and family history reviewed and updated.  ROS: Denies chest pain, dyspnea, palpitations, lightheadedness or syncope. He notes no peripheral edema. All other systems reviewed and are negative.  PHYSICAL EXAM: BP 105/67  Pulse 67  Ht 5' 7.5" (1.715 m)  Wt 80.74 kg (178 lb)  BMI 27.45 kg/m2;  Body mass index is 27.45 kg/(m^2). General-Well developed; no acute distress Body habitus-proportionate weight and height Neck-No JVD; no carotid bruits Lungs-clear lung fields; resonant to percussion Cardiovascular-normal PMI; normal S1 and S2; minimal early systolic ejection murmur; regular rhythm Abdomen-normal bowel sounds; soft and non-tender without masses or organomegaly Musculoskeletal-No deformities, no cyanosis or clubbing Neurologic-Normal cranial nerves; symmetric strength and  tone Skin-Warm, no significant lesions Extremities-distal pulses intact; no edema  Rhythm Strip: Sinus bradycardia at a rate of 58 bpm; first-degree AV block; possible prior inferior myocardial infarction; otherwise unremarkable. No previous tracing for comparison.  [name], MD 01/31/2013  3:17 PM  ASSESSMENT AND PLAN

## 2013-01-31 NOTE — Assessment & Plan Note (Signed)
Recent laboratory testing to be obtained from Dr. Ouida Sills. Control of hyperlipidemia was excellent in January of this year.

## 2013-01-31 NOTE — Assessment & Plan Note (Addendum)
Patient is 2 years overdue for screening colonoscopy, a fact that he has recently discussed with Dr. Ouida Sills. He reluctantly agrees to proceed in the near future.  There has been no evidence for occult GI blood loss.

## 2013-01-31 NOTE — Assessment & Plan Note (Signed)
No recent elevations in blood pressure. Current minimal antihypertensive therapy appears effective.

## 2013-01-31 NOTE — Assessment & Plan Note (Signed)
Superb and benign postoperative course. We will continue to optimally manage cardiovascular risk factors. Patient congratulated on his efforts in this regard to date.

## 2013-02-05 ENCOUNTER — Encounter: Payer: Self-pay | Admitting: Cardiology

## 2013-03-07 ENCOUNTER — Ambulatory Visit (INDEPENDENT_AMBULATORY_CARE_PROVIDER_SITE_OTHER): Payer: Medicare Other | Admitting: *Deleted

## 2013-03-07 DIAGNOSIS — I4892 Unspecified atrial flutter: Secondary | ICD-10-CM

## 2013-03-07 DIAGNOSIS — Z7901 Long term (current) use of anticoagulants: Secondary | ICD-10-CM

## 2013-04-05 ENCOUNTER — Other Ambulatory Visit: Payer: Self-pay | Admitting: *Deleted

## 2013-04-05 MED ORDER — ATORVASTATIN CALCIUM 80 MG PO TABS: 80.0000 mg | ORAL_TABLET | Freq: Every day | ORAL | Status: DC

## 2013-04-19 ENCOUNTER — Ambulatory Visit (INDEPENDENT_AMBULATORY_CARE_PROVIDER_SITE_OTHER): Payer: Medicare Other | Admitting: *Deleted

## 2013-04-19 DIAGNOSIS — I4892 Unspecified atrial flutter: Secondary | ICD-10-CM

## 2013-04-19 DIAGNOSIS — Z7901 Long term (current) use of anticoagulants: Secondary | ICD-10-CM

## 2013-04-19 LAB — POCT INR: INR: 2.3

## 2013-05-30 ENCOUNTER — Ambulatory Visit (INDEPENDENT_AMBULATORY_CARE_PROVIDER_SITE_OTHER): Payer: Medicare Other | Admitting: *Deleted

## 2013-05-30 DIAGNOSIS — I4892 Unspecified atrial flutter: Secondary | ICD-10-CM

## 2013-05-30 DIAGNOSIS — Z7901 Long term (current) use of anticoagulants: Secondary | ICD-10-CM

## 2013-05-30 LAB — POCT INR: INR: 2.5

## 2013-06-22 ENCOUNTER — Telehealth: Payer: Self-pay | Admitting: *Deleted

## 2013-06-22 MED ORDER — WARFARIN SODIUM 5 MG PO TABS: ORAL_TABLET | ORAL | Status: DC

## 2013-06-22 NOTE — Telephone Encounter (Signed)
Needs new RX for 1 and 1/2 tabs daily sent to [facility] / tgs

## 2013-06-26 ENCOUNTER — Telehealth: Payer: Self-pay

## 2013-06-26 NOTE — Telephone Encounter (Signed)
Pt's wife called this morning to set up his TCS. He would like to have it done on a Wednesday. Please call her at [phone], thanks.

## 2013-06-26 NOTE — Telephone Encounter (Signed)
Pt is on coumadin and has OV with Gerrit Halls, NP on 07/12/2013 at 3:00 PM. ( He was originally referred by Dr. Ouida Sills for colonoscopy and scheduled in March to come in and his wife had to have some surgery so he had to postpone his appt.

## 2013-07-11 ENCOUNTER — Ambulatory Visit (INDEPENDENT_AMBULATORY_CARE_PROVIDER_SITE_OTHER): Payer: Medicare Other | Admitting: *Deleted

## 2013-07-11 DIAGNOSIS — I4892 Unspecified atrial flutter: Secondary | ICD-10-CM

## 2013-07-11 DIAGNOSIS — Z7901 Long term (current) use of anticoagulants: Secondary | ICD-10-CM

## 2013-07-11 LAB — POCT INR: INR: 2

## 2013-07-12 ENCOUNTER — Encounter (INDEPENDENT_AMBULATORY_CARE_PROVIDER_SITE_OTHER): Payer: Self-pay

## 2013-07-12 ENCOUNTER — Other Ambulatory Visit: Payer: Self-pay | Admitting: Gastroenterology

## 2013-07-12 ENCOUNTER — Encounter: Payer: Self-pay | Admitting: Gastroenterology

## 2013-07-12 ENCOUNTER — Ambulatory Visit (INDEPENDENT_AMBULATORY_CARE_PROVIDER_SITE_OTHER): Payer: Medicare Other | Admitting: Gastroenterology

## 2013-07-12 ENCOUNTER — Ambulatory Visit: Payer: Medicare Other | Admitting: Gastroenterology

## 2013-07-12 VITALS — BP 120/65 | HR 66 | Temp 97.6°F | Wt 177.6 lb

## 2013-07-12 DIAGNOSIS — Z1211 Encounter for screening for malignant neoplasm of colon: Secondary | ICD-10-CM

## 2013-07-12 DIAGNOSIS — Z7901 Long term (current) use of anticoagulants: Secondary | ICD-10-CM

## 2013-07-12 MED ORDER — PEG 3350-KCL-NA BICARB-NACL 420 G PO SOLR: 4000.0000 mL | ORAL | Status: DC

## 2013-07-12 NOTE — Assessment & Plan Note (Signed)
PT REPORTS HEART RHYTHM NL.  DISCUSSED BEFITS V RISKS OF STOPIPNG ANTI-COAGULATION AND LOVENOX BRIDGE. PT DECLINED LOVENOX BRIDGE. HE MAY CALL IF HE CHANGES HIS MIND.

## 2013-07-12 NOTE — Progress Notes (Signed)
Subjective:    Patient ID: Shane [name], male    DOB: 12/28/1932, 80 y.o.   MRN: [id number]  FAGAN,ROY, MD  HPI No problems at all. DOESN'T REMEMBER BEING ON COUMADIN LAST TIME. HAD MI 1986/BYPASS IN 90. NO CVA. BEEN OFF AND ON COUMAIDN FOR SEVERAL YEARS. WAS OUT OF RHYTHM BUT NO PROBLEM FOR THE LAST COUPLE OF YEARS. PT DENIES FEVER, CHILLS, BRBPR, nausea, vomiting, melena, diarrhea, constipation, abd pain, problems swallowing, OR heartburn or indigestion.  Past Medical History  Diagnosis Date  . Arteriosclerotic cardiovascular disease (ASCVD)     CABG surgery in 1990. MI in 1986; coronary angiography in 2003-critical LAD with patent LIMA; total obstruction of the RCA with patent RIMA; low normal EF.  . Hyperlipidemia     Lipid profile in 09/2010:196, 84, 54, 125  . Atrial flutter, paroxysmal     asymptomatic; onset in 2010; 4:1 AVB with low-dose metoprolol; moderate left      atrial enlargement and mild left ventricular hypertrophy with normal ejection fraction by echocardiography  . Chronic anticoagulation   . Benign prostatic hypertrophy   . Nephrolithiasis   . Polymyalgia rheumatica    Past Surgical History  Procedure Laterality Date  . Coronary artery bypass graft  1990  . Lumbar disc surgery    . Colonoscopy  08/21/01    RMR: Left-sided diverticulum.  Remainder of colonic mucosa appeared normal    No Known Allergies  Current Outpatient Prescriptions  Medication Sig Dispense Refill  . atorvastatin (LIPITOR) 80 MG tablet Take 1 tablet (80 mg total) by mouth daily.    . metoprolol (LOPRESSOR) 25 MG tablet Take 25 mg by mouth 2 (two) times daily.        . Tamsulosin HCl (FLOMAX) 0.4 MG CAPS Take 0.4 mg by mouth daily.        . warfarin (COUMADIN) 5 MG tablet Take coumadin 1 1/2 tablets daily or as directed by Anticoagulation clinic.    . acetaminophen (TYLENOL EX ST ARTHRITIS PAIN) 500 MG tablet Take 500 mg by mouth as needed.          Family History  Problem Relation  Age of Onset  . Heart attack Mother   . Coronary artery disease Brother     CABG + pacemaker  . Pneumonia Brother     infant death  . Colon cancer Neg Hx   . Colon polyps Neg Hx     History  Substance Use Topics  . Smoking status: Former Smoker  . Smokeless tobacco: Never Used  . Alcohol Use: No   WORKS [employer]  Review of Systems PER HPI OTHERWISE ALL SYSTEMS ARE NEGATIVE.     Objective:   Physical Exam  Vitals reviewed. Constitutional: He is oriented to person, place, and time. He appears well-nourished. No distress.  HENT:  Head: Normocephalic and atraumatic.  Mouth/Throat: Oropharynx is clear and moist. No oropharyngeal exudate.  Eyes: Pupils are equal, round, and reactive to light. No scleral icterus.  Neck: Normal range of motion. Neck supple.  Cardiovascular: Normal rate, regular rhythm and normal heart sounds.   No murmur heard. Pulmonary/Chest: Effort normal and breath sounds normal. No respiratory distress.  Abdominal: Soft. Bowel sounds are normal. He exhibits no distension. There is no tenderness.  Musculoskeletal: He exhibits no edema.  Lymphadenopathy:    He has no cervical adenopathy.  Neurological: He is alert and oriented to person, place, and time.  NO FOCAL DEFICITS   Psychiatric: He   has a normal mood and affect.          Assessment & Plan:   

## 2013-07-12 NOTE — Patient Instructions (Signed)
COLONOSCOPY NOV 25.  HOLD COUMADIN 5 DAYS PRIOR TO YOUR COLONOSCOPY.  FOLLOW A HIGH FIBER DIET. SEE INFO BELOW.  FOLLOW UP AS NEEDED.  High-Fiber Diet A high-fiber diet changes your normal diet to include more whole grains, legumes, fruits, and vegetables. Changes in the diet involve replacing refined carbohydrates with unrefined foods. The calorie level of the diet is essentially unchanged. The Dietary Reference Intake (recommended amount) for adult males is 38 grams per day. For adult females, it is 25 grams per day. Pregnant and lactating women should consume 28 grams of fiber per day. Fiber is the intact part of a plant that is not broken down during digestion. Functional fiber is fiber that has been isolated from the plant to provide a beneficial effect in the body. PURPOSE  Increase stool bulk.   Ease and regulate bowel movements.   Lower cholesterol.  INDICATIONS THAT YOU NEED MORE FIBER  Constipation and hemorrhoids.   Uncomplicated diverticulosis (intestine condition) and irritable bowel syndrome.   Weight management.   As a protective measure against hardening of the arteries (atherosclerosis), diabetes, and cancer.   GUIDELINES FOR INCREASING FIBER IN THE DIET  Start adding fiber to the diet slowly. A gradual increase of about 5 more grams (2 slices of whole-wheat bread, 2 servings of most fruits or vegetables, or 1 bowl of high-fiber cereal) per day is best. Too rapid an increase in fiber may result in constipation, flatulence, and bloating.   Drink enough water and fluids to keep your urine clear or pale yellow. Water, juice, or caffeine-free drinks are recommended. Not drinking enough fluid may cause constipation.   Eat a variety of high-fiber foods rather than one type of fiber.   Try to increase your intake of fiber through using high-fiber foods rather than fiber pills or supplements that contain small amounts of fiber.   The goal is to change the types of food  eaten. Do not supplement your present diet with high-fiber foods, but replace foods in your present diet.  INCLUDE A VARIETY OF FIBER SOURCES  Replace refined and processed grains with whole grains, canned fruits with fresh fruits, and incorporate other fiber sources. White rice, white breads, and most bakery goods contain little or no fiber.   Brown whole-grain rice, buckwheat oats, and many fruits and vegetables are all good sources of fiber. These include: broccoli, Brussels sprouts, cabbage, cauliflower, beets, sweet potatoes, white potatoes (skin on), carrots, tomatoes, eggplant, squash, berries, fresh fruits, and dried fruits.   Cereals appear to be the richest source of fiber. Cereal fiber is found in whole grains and bran. Bran is the fiber-rich outer coat of cereal grain, which is largely removed in refining. In whole-grain cereals, the bran remains. In breakfast cereals, the largest amount of fiber is found in those with "bran" in their names. The fiber content is sometimes indicated on the label.   You may need to include additional fruits and vegetables each day.   In baking, for 1 cup white flour, you may use the following substitutions:   1 cup whole-wheat flour minus 2 tablespoons.   1/2 cup white flour plus 1/2 cup whole-wheat flour.

## 2013-07-12 NOTE — Assessment & Plan Note (Signed)
AVERAGE RISK-LAST TCS > 10 YRS  TCS NOV 25.

## 2013-07-16 NOTE — Progress Notes (Signed)
cc'd to pcp 

## 2013-07-25 ENCOUNTER — Encounter (HOSPITAL_COMMUNITY): Payer: Self-pay | Admitting: Pharmacy Technician

## 2013-07-31 ENCOUNTER — Encounter (HOSPITAL_COMMUNITY)
Admission: RE | Disposition: A | Discharge status: Home / Self Care | Payer: Self-pay | Source: Ambulatory Visit | Attending: Gastroenterology

## 2013-07-31 ENCOUNTER — Encounter (HOSPITAL_COMMUNITY): Payer: Self-pay | Admitting: *Deleted

## 2013-07-31 ENCOUNTER — Ambulatory Visit (HOSPITAL_COMMUNITY)
Admission: RE | Admit: 2013-07-31 | Discharge: 2013-07-31 | Disposition: A | Discharge status: Home / Self Care | Payer: Medicare Other | Source: Ambulatory Visit | Attending: Gastroenterology | Admitting: Gastroenterology

## 2013-07-31 DIAGNOSIS — K5730 Diverticulosis of large intestine without perforation or abscess without bleeding: Secondary | ICD-10-CM

## 2013-07-31 DIAGNOSIS — D126 Benign neoplasm of colon, unspecified: Secondary | ICD-10-CM

## 2013-07-31 DIAGNOSIS — K648 Other hemorrhoids: Secondary | ICD-10-CM

## 2013-07-31 DIAGNOSIS — Q438 Other specified congenital malformations of intestine: Secondary | ICD-10-CM | POA: Insufficient documentation

## 2013-07-31 DIAGNOSIS — Z951 Presence of aortocoronary bypass graft: Secondary | ICD-10-CM | POA: Insufficient documentation

## 2013-07-31 DIAGNOSIS — Z7901 Long term (current) use of anticoagulants: Secondary | ICD-10-CM | POA: Insufficient documentation

## 2013-07-31 DIAGNOSIS — Z1211 Encounter for screening for malignant neoplasm of colon: Secondary | ICD-10-CM | POA: Insufficient documentation

## 2013-07-31 HISTORY — PX: COLONOSCOPY: SHX5424

## 2013-07-31 SURGERY — COLONOSCOPY
Anesthesia: Moderate Sedation

## 2013-07-31 MED ORDER — SODIUM CHLORIDE 0.9 % IV SOLN
INTRAVENOUS | Status: DC
  Administered 2013-07-31: 1000 mL via INTRAVENOUS

## 2013-07-31 MED ORDER — STERILE WATER FOR IRRIGATION IR SOLN
Status: DC | PRN
  Administered 2013-07-31: 10:00:00

## 2013-07-31 MED ORDER — MIDAZOLAM HCL 5 MG/5ML IJ SOLN
INTRAMUSCULAR | Status: AC
  Filled 2013-07-31: qty 10

## 2013-07-31 MED ORDER — MIDAZOLAM HCL 5 MG/5ML IJ SOLN
INTRAMUSCULAR | Status: DC | PRN
  Administered 2013-07-31: 1 mg via INTRAVENOUS
  Administered 2013-07-31: 2 mg via INTRAVENOUS
  Administered 2013-07-31: 1 mg via INTRAVENOUS

## 2013-07-31 MED ORDER — MEPERIDINE HCL 100 MG/ML IJ SOLN
INTRAMUSCULAR | Status: DC | PRN
  Administered 2013-07-31 (×2): 25 mg via INTRAVENOUS

## 2013-07-31 MED ORDER — MEPERIDINE HCL 100 MG/ML IJ SOLN
INTRAMUSCULAR | Status: AC
  Filled 2013-07-31: qty 2

## 2013-07-31 NOTE — Op Note (Signed)
[facility] [address] [place], [address]   COLONOSCOPY PROCEDURE REPORT  PATIENT: Shane [name], Shane [name]  MR#: [id number] BIRTHDATE: 29-Jun-1933 , 80  yrs. old GENDER: Male ENDOSCOPIST: Jonette Eva, MD REFERRED BY:Roy Ouida Sills, M.D. PROCEDURE DATE:  07/31/2013 PROCEDURE:   Colonoscopy with snare polypectomy and with cold biopsy polypectomy INDICATIONS:Colorectal cancer screening. MEDICATIONS: Demerol 50 mg IV and Versed 4 mg IV  DESCRIPTION OF PROCEDURE:    Physical exam was performed.  Informed consent was obtained from the patient after explaining the benefits, risks, and alternatives to procedure.  The patient was connected to monitor and placed in left lateral position. Continuous oxygen was provided by nasal cannula and IV medicine administered through an indwelling cannula.  After administration of sedation and rectal exam, the patients rectum was intubated and the EC-[id number] ([id number])  colonoscope was advanced under direct visualization to the CECUM. The scope was removed slowly by carefully examining the color, texture, anatomy, and integrity mucosa on the way out.  The patient was recovered in endoscopy and discharged home in satisfactory condition.    COLON FINDINGS: Six polypoid shaped sessile polyps ranging between 3-42mm in size were found at the cecum, hepatic flexure, in the descending colon, and sigmoid colon.  A polypectomy was performed with cold forceps and using snare cautery(DC-1).  , Mild diverticulosis was noted in the ascending colon, transverse colon, and descending colon.  , There was moderate diverticulosis noted in the sigmoid colon with associated colonic narrowing, muscular hypertrophy and angulation.  , The colon wIS SLIGHTLY redundant. Manual abdominal counter-pressure was used to reach the cecum, and Moderate sized internal hemorrhoids were found.  PREP QUALITY: good.   CECAL W/D TIME: 19 minutes      COMPLICATIONS: None  ENDOSCOPIC IMPRESSION: 1.   Six COLON POLYPS REMOVED 2.   Mild diverticulosis in the ascending colon, transverse colon, and descending colon 3.   Moderate diverticulosis in the sigmoid colon 4.   The colon IS SLIGHTLY redundant 5.   Moderate sized internal hemorrhoids   RECOMMENDATIONS: AWAIT BIOPSY HIGH FIBER DIET RESTART COUMADIN NOV 29 TCS IN 10 YEARS IF THE BENEFITS OUTWEIGH THE RISKS       _______________________________ Rosalie DoctorJonette Eva, MD 07/31/2013 11:17 AM

## 2013-07-31 NOTE — H&P (View-Only) (Signed)
Subjective:    Patient ID: Shane [name], male    DOB: 1933/01/12, 79 y.o.   MRN: [id number]  [name], MD  HPI No problems at all. DOESN'T REMEMBER BEING ON COUMADIN LAST TIME. HAD MI 1986/BYPASS IN 90. NO CVA. BEEN OFF AND ON COUMAIDN FOR SEVERAL YEARS. WAS OUT OF RHYTHM BUT NO PROBLEM FOR THE LAST COUPLE OF YEARS. PT DENIES FEVER, CHILLS, BRBPR, nausea, vomiting, melena, diarrhea, constipation, abd pain, problems swallowing, OR heartburn or indigestion.  Past Medical History  Diagnosis Date  . Arteriosclerotic cardiovascular disease (ASCVD)     CABG surgery in 1990. MI in 1986; coronary angiography in 2003-critical LAD with patent LIMA; total obstruction of the RCA with patent RIMA; low normal EF.  [name] Hyperlipidemia     Lipid profile in 09/2010:196, 84, 54, 125  . Atrial flutter, paroxysmal     asymptomatic; onset in 2010; 4:1 AVB with low-dose metoprolol; moderate left      atrial enlargement and mild left ventricular hypertrophy with normal ejection fraction by echocardiography  . Chronic anticoagulation   . Benign prostatic hypertrophy   . Nephrolithiasis   . Polymyalgia rheumatica    Past Surgical History  Procedure Laterality Date  . Coronary artery bypass graft  1990  . Lumbar disc surgery    . Colonoscopy  08/21/01    RMR: Left-sided diverticulum.  Remainder of colonic mucosa appeared normal    No Known Allergies  Current Outpatient Prescriptions  Medication Sig Dispense Refill  . atorvastatin (LIPITOR) 80 MG tablet Take 1 tablet (80 mg total) by mouth daily.    . metoprolol (LOPRESSOR) 25 MG tablet Take 25 mg by mouth 2 (two) times daily.        . Tamsulosin HCl (FLOMAX) 0.4 MG CAPS Take 0.4 mg by mouth daily.        [name] warfarin (COUMADIN) 5 MG tablet Take coumadin 1 1/2 tablets daily or as directed by Anticoagulation clinic.    [name] acetaminophen (TYLENOL EX ST ARTHRITIS PAIN) 500 MG tablet Take 500 mg by mouth as needed.          Family History  Problem Relation  Age of Onset  . Heart attack Mother   . Coronary artery disease Brother     CABG + pacemaker  . Pneumonia Brother     infant death  . Colon cancer Neg Hx   . Colon polyps Neg Hx     History  Substance Use Topics  . Smoking status: Former Games developer  . Smokeless tobacco: Never Used  . Alcohol Use: No   WORKS [employer] [address]  Review of Systems PER HPI OTHERWISE ALL SYSTEMS ARE NEGATIVE.     Objective:   Physical Exam  Vitals reviewed. Constitutional: He is oriented to person, place, and time. He appears well-nourished. No distress.  HENT:  Head: Normocephalic and atraumatic.  Mouth/Throat: Oropharynx is clear and moist. No oropharyngeal exudate.  Eyes: Pupils are equal, round, and reactive to light. No scleral icterus.  Neck: Normal range of motion. Neck supple.  Cardiovascular: Normal rate, regular rhythm and normal heart sounds.   No murmur heard. Pulmonary/Chest: Effort normal and breath sounds normal. No respiratory distress.  Abdominal: Soft. Bowel sounds are normal. He exhibits no distension. There is no tenderness.  Musculoskeletal: He exhibits no edema.  Lymphadenopathy:    He has no cervical adenopathy.  Neurological: He is alert and oriented to person, place, and time.  NO FOCAL DEFICITS   Psychiatric: He  has a normal mood and affect.          Assessment & Plan:

## 2013-07-31 NOTE — Interval H&P Note (Signed)
History and Physical Interval Note:  07/31/2013 10:16 AM  Shane [name]  has presented today for surgery, with the diagnosis of SCREENING  The various methods of treatment have been discussed with the patient and family. After consideration of risks, benefits and other options for treatment, the patient has consented to  Procedure(s) with comments: COLONOSCOPY (N/A) - 10:30 as a surgical intervention .  The patient's history has been reviewed, patient examined, no change in status, stable for surgery.  I have reviewed the patient's chart and labs.  Questions were answered to the patient's satisfaction.     [employer]

## 2013-08-03 ENCOUNTER — Telehealth: Payer: Self-pay | Admitting: Gastroenterology

## 2013-08-03 NOTE — Telephone Encounter (Signed)
Please call pt. HE had simple adenomas removed from HIS colon. FOLLOW A HIGH FIBER DIET. TCS IN 5-10 YEARS IF THE BENEFITS OUTWEIGH THE RISKS. HE SHOULD SEE [place] IN 2019 TO DISCUSS BENEFITS V. RISKS OF HAVING A COLONOSCOPY.

## 2013-08-06 NOTE — Telephone Encounter (Signed)
Pt's wife aware of results

## 2013-08-06 NOTE — Telephone Encounter (Signed)
RECALL IN EPIC 

## 2013-08-07 ENCOUNTER — Encounter (HOSPITAL_COMMUNITY): Payer: Self-pay | Admitting: Gastroenterology

## 2013-08-15 ENCOUNTER — Ambulatory Visit: Payer: Medicare Other | Admitting: Gastroenterology

## 2013-08-22 ENCOUNTER — Ambulatory Visit (INDEPENDENT_AMBULATORY_CARE_PROVIDER_SITE_OTHER): Payer: Medicare Other | Admitting: *Deleted

## 2013-08-22 DIAGNOSIS — I4892 Unspecified atrial flutter: Secondary | ICD-10-CM

## 2013-08-22 DIAGNOSIS — Z7901 Long term (current) use of anticoagulants: Secondary | ICD-10-CM

## 2013-10-03 ENCOUNTER — Ambulatory Visit (INDEPENDENT_AMBULATORY_CARE_PROVIDER_SITE_OTHER): Payer: Medicare Other | Admitting: *Deleted

## 2013-10-03 DIAGNOSIS — Z5181 Encounter for therapeutic drug level monitoring: Secondary | ICD-10-CM

## 2013-10-03 DIAGNOSIS — I4892 Unspecified atrial flutter: Secondary | ICD-10-CM

## 2013-10-03 DIAGNOSIS — Z7901 Long term (current) use of anticoagulants: Secondary | ICD-10-CM

## 2013-10-03 LAB — POCT INR: INR: 1.8

## 2013-10-16 ENCOUNTER — Encounter: Payer: Self-pay | Admitting: Cardiology

## 2013-10-24 ENCOUNTER — Ambulatory Visit (INDEPENDENT_AMBULATORY_CARE_PROVIDER_SITE_OTHER): Payer: Medicare Other | Admitting: *Deleted

## 2013-10-24 DIAGNOSIS — I4892 Unspecified atrial flutter: Secondary | ICD-10-CM

## 2013-10-24 DIAGNOSIS — Z5181 Encounter for therapeutic drug level monitoring: Secondary | ICD-10-CM

## 2013-10-24 DIAGNOSIS — Z7901 Long term (current) use of anticoagulants: Secondary | ICD-10-CM

## 2013-10-24 LAB — POCT INR: INR: 3.7

## 2013-11-14 ENCOUNTER — Ambulatory Visit (INDEPENDENT_AMBULATORY_CARE_PROVIDER_SITE_OTHER): Payer: Medicare Other | Admitting: *Deleted

## 2013-11-14 DIAGNOSIS — I4892 Unspecified atrial flutter: Secondary | ICD-10-CM

## 2013-11-14 DIAGNOSIS — Z7901 Long term (current) use of anticoagulants: Secondary | ICD-10-CM

## 2013-11-14 DIAGNOSIS — Z5181 Encounter for therapeutic drug level monitoring: Secondary | ICD-10-CM

## 2013-11-14 LAB — POCT INR: INR: 3.1

## 2013-12-12 ENCOUNTER — Ambulatory Visit (INDEPENDENT_AMBULATORY_CARE_PROVIDER_SITE_OTHER): Payer: Medicare Other | Admitting: *Deleted

## 2013-12-12 DIAGNOSIS — Z5181 Encounter for therapeutic drug level monitoring: Secondary | ICD-10-CM

## 2013-12-12 DIAGNOSIS — Z7901 Long term (current) use of anticoagulants: Secondary | ICD-10-CM

## 2013-12-12 DIAGNOSIS — I4892 Unspecified atrial flutter: Secondary | ICD-10-CM

## 2013-12-12 LAB — POCT INR: INR: 2.1

## 2014-01-09 ENCOUNTER — Ambulatory Visit (INDEPENDENT_AMBULATORY_CARE_PROVIDER_SITE_OTHER): Payer: Medicare Other | Admitting: *Deleted

## 2014-01-09 DIAGNOSIS — I4892 Unspecified atrial flutter: Secondary | ICD-10-CM

## 2014-01-09 DIAGNOSIS — Z5181 Encounter for therapeutic drug level monitoring: Secondary | ICD-10-CM

## 2014-01-09 DIAGNOSIS — Z7901 Long term (current) use of anticoagulants: Secondary | ICD-10-CM

## 2014-01-09 LAB — POCT INR: INR: 2.2

## 2014-02-04 ENCOUNTER — Ambulatory Visit (INDEPENDENT_AMBULATORY_CARE_PROVIDER_SITE_OTHER): Payer: Medicare Other | Admitting: *Deleted

## 2014-02-04 DIAGNOSIS — Z7901 Long term (current) use of anticoagulants: Secondary | ICD-10-CM

## 2014-02-04 DIAGNOSIS — Z5181 Encounter for therapeutic drug level monitoring: Secondary | ICD-10-CM

## 2014-02-04 DIAGNOSIS — I4892 Unspecified atrial flutter: Secondary | ICD-10-CM

## 2014-02-04 LAB — POCT INR: INR: 2.5

## 2014-03-04 ENCOUNTER — Ambulatory Visit (INDEPENDENT_AMBULATORY_CARE_PROVIDER_SITE_OTHER): Payer: Medicare Other | Admitting: *Deleted

## 2014-03-04 DIAGNOSIS — Z5181 Encounter for therapeutic drug level monitoring: Secondary | ICD-10-CM

## 2014-03-04 DIAGNOSIS — I4892 Unspecified atrial flutter: Secondary | ICD-10-CM

## 2014-03-04 DIAGNOSIS — Z7901 Long term (current) use of anticoagulants: Secondary | ICD-10-CM

## 2014-03-04 LAB — POCT INR: INR: 2.7

## 2014-03-27 ENCOUNTER — Other Ambulatory Visit: Payer: Self-pay | Admitting: Cardiology

## 2014-04-08 ENCOUNTER — Ambulatory Visit: Payer: Medicare Other | Admitting: Cardiology

## 2014-04-10 ENCOUNTER — Telehealth: Payer: Self-pay | Admitting: Cardiology

## 2014-04-10 MED ORDER — ATORVASTATIN CALCIUM 80 MG PO TABS: ORAL_TABLET | ORAL | Status: DC

## 2014-04-10 NOTE — Telephone Encounter (Signed)
Medication sent via escribe.  

## 2014-04-10 NOTE — Telephone Encounter (Signed)
Received fax refill request  Rx # [id number] Medication:  Atorvastatin 80 mg tablets Qty 15 Sig:  Take one tablet daily Physician:  Domenic Polite

## 2014-04-15 ENCOUNTER — Ambulatory Visit (INDEPENDENT_AMBULATORY_CARE_PROVIDER_SITE_OTHER): Payer: Medicare Other | Admitting: *Deleted

## 2014-04-15 DIAGNOSIS — Z7901 Long term (current) use of anticoagulants: Secondary | ICD-10-CM

## 2014-04-15 DIAGNOSIS — Z5181 Encounter for therapeutic drug level monitoring: Secondary | ICD-10-CM

## 2014-04-15 DIAGNOSIS — I4892 Unspecified atrial flutter: Secondary | ICD-10-CM

## 2014-04-15 LAB — POCT INR: INR: 2.8

## 2014-04-23 ENCOUNTER — Telehealth: Payer: Self-pay | Admitting: Cardiology

## 2014-04-23 MED ORDER — WARFARIN SODIUM 5 MG PO TABS: ORAL_TABLET | ORAL | Status: DC

## 2014-04-23 NOTE — Telephone Encounter (Signed)
Received fax refill request  Rx # [id number] Medication:  Warfarin Sodium 5 mg tablet Qty 45 Sig:  Take 1 and 1/2 tablets by mouth daily or as directed by clinic Physician:  Harl Bowie

## 2014-05-02 ENCOUNTER — Ambulatory Visit (INDEPENDENT_AMBULATORY_CARE_PROVIDER_SITE_OTHER): Payer: Medicare Other | Admitting: Otolaryngology

## 2014-05-02 DIAGNOSIS — H6120 Impacted cerumen, unspecified ear: Secondary | ICD-10-CM

## 2014-05-02 DIAGNOSIS — H903 Sensorineural hearing loss, bilateral: Secondary | ICD-10-CM

## 2014-05-15 ENCOUNTER — Ambulatory Visit: Payer: Medicare Other | Admitting: Cardiology

## 2014-05-16 ENCOUNTER — Encounter: Payer: Self-pay | Admitting: Cardiology

## 2014-05-16 ENCOUNTER — Ambulatory Visit (INDEPENDENT_AMBULATORY_CARE_PROVIDER_SITE_OTHER): Payer: Medicare Other | Admitting: *Deleted

## 2014-05-16 ENCOUNTER — Ambulatory Visit (INDEPENDENT_AMBULATORY_CARE_PROVIDER_SITE_OTHER): Payer: Medicare Other | Admitting: Cardiology

## 2014-05-16 VITALS — BP 116/62 | HR 66 | Ht 67.0 in | Wt 180.0 lb

## 2014-05-16 DIAGNOSIS — I7090 Unspecified atherosclerosis: Secondary | ICD-10-CM

## 2014-05-16 DIAGNOSIS — I4892 Unspecified atrial flutter: Secondary | ICD-10-CM

## 2014-05-16 DIAGNOSIS — I2510 Atherosclerotic heart disease of native coronary artery without angina pectoris: Secondary | ICD-10-CM

## 2014-05-16 DIAGNOSIS — Z5181 Encounter for therapeutic drug level monitoring: Secondary | ICD-10-CM

## 2014-05-16 DIAGNOSIS — E785 Hyperlipidemia, unspecified: Secondary | ICD-10-CM

## 2014-05-16 DIAGNOSIS — Z7901 Long term (current) use of anticoagulants: Secondary | ICD-10-CM

## 2014-05-16 LAB — POCT INR: INR: 2.8

## 2014-05-16 NOTE — Patient Instructions (Signed)
Your physician wants you to follow-up in: 1 year You will receive a reminder letter in the mail two months in advance. If you don't receive a letter, please call our office to schedule the follow-up appointment.    Your physician recommends that you continue on your current medications as directed. Please refer to the Current Medication list given to you today.     Thank you for choosing [facility] !  

## 2014-05-16 NOTE — Progress Notes (Signed)
Clinical Summary Shane [name] is a 80 y.o.male former patient of Dr Lattie Haw, this is our first visit together. He is seen for the following medical problems.  1. CAD - prior CABG in 1990 - last cath 2003 as reported below. - Jan 2010 echo LVEF 55-60% - denies any chest pain. No SOB or DOE, walks regularly long distance without limitation - compliant with meds  2. Hyperlipidemia - compliant with lipitor  3. Aflutter - denies any recent palpitations - compliant, denies any troubles with coumadin.   Past Medical History  Diagnosis Date  . Arteriosclerotic cardiovascular disease (ASCVD)     CABG surgery in 1990. MI in 1986; coronary angiography in 2003-critical LAD with patent LIMA; total obstruction of the RCA with patent RIMA; low normal EF.  [name] Hyperlipidemia     Lipid profile in 09/2010:196, 84, 54, 125  . Atrial flutter, paroxysmal     asymptomatic; onset in 2010; 4:1 AVB with low-dose metoprolol; moderate left      atrial enlargement and mild left ventricular hypertrophy with normal ejection fraction by echocardiography  . Chronic anticoagulation   . Benign prostatic hypertrophy   . Nephrolithiasis   . Polymyalgia rheumatica      No Known Allergies   Current Outpatient Prescriptions  Medication Sig Dispense Refill  . acetaminophen (TYLENOL EX ST ARTHRITIS PAIN) 500 MG tablet Take 500 mg by mouth as needed.        [name] atorvastatin (LIPITOR) 80 MG tablet TAKE ONE TABLET DAILY.  30 tablet  1  . metoprolol (LOPRESSOR) 25 MG tablet Take 25 mg by mouth 2 (two) times daily.       . Tamsulosin HCl (FLOMAX) 0.4 MG CAPS Take 0.4 mg by mouth daily.        [name] warfarin (COUMADIN) 5 MG tablet Take 1 1/2 tablets daily or as directed  45 tablet  6   No current facility-administered medications for this visit.     Past Surgical History  Procedure Laterality Date  . Coronary artery bypass graft  1990  . Lumbar disc surgery    . Colonoscopy  08/21/01    RMR: Left-sided  diverticulum.  Remainder of colonic mucosa appeared normal  . Colonoscopy N/A 07/31/2013    Procedure: COLONOSCOPY;  Surgeon: Danie Binder, MD;  Location: [facility];  Service: Endoscopy;  Laterality: N/A;  10:30     No Known Allergies    Family History  Problem Relation Age of Onset  . Heart attack Mother   . Coronary artery disease Brother     CABG + pacemaker  . Pneumonia Brother     infant death  . Colon cancer Neg Hx   . Colon polyps Neg Hx      Social History Mr. Brancato reports that he quit smoking about 31 years ago. He has never used smokeless tobacco. Mr. Clinger reports that he drinks alcohol.   Review of Systems CONSTITUTIONAL: No weight loss, fever, chills, weakness or fatigue.  HEENT: Eyes: No visual loss, blurred vision, double vision or yellow sclerae.No hearing loss, sneezing, congestion, runny nose or sore throat.  SKIN: No rash or itching.  CARDIOVASCULAR: per HPI RESPIRATORY: No shortness of breath, cough or sputum.  GASTROINTESTINAL: No anorexia, nausea, vomiting or diarrhea. No abdominal pain or blood.  GENITOURINARY: No burning on urination, no polyuria NEUROLOGICAL: No headache, dizziness, syncope, paralysis, ataxia, numbness or tingling in the extremities. No change in bowel or bladder control.  MUSCULOSKELETAL: No muscle, back  pain, joint pain or stiffness.  LYMPHATICS: No enlarged nodes. No history of splenectomy.  PSYCHIATRIC: No history of depression or anxiety.  ENDOCRINOLOGIC: No reports of sweating, cold or heat intolerance. No polyuria or polydipsia.  [name]   Physical Examination p 66 bp 116/62 Wt 180 lbs BMI 28 Gen: resting comfortably, no acute distress HEENT: no scleral icterus, pupils equal round and reactive, no palptable cervical adenopathy,  CV: RRR, no m/r/g, no JVD, no carotid bruits Resp: Clear to auscultation bilaterally GI: abdomen is soft, non-tender, non-distended, normal bowel sounds, no hepatosplenomegaly MSK: extremities  are warm, no edema.  Skin: warm, no rash Neuro:  no focal deficits Psych: appropriate affect   Diagnostic Studies Cath 07/2002 FINDINGS:  1. Left main trunk. Medium caliber vessel with mild ________.  2. LAD. This is a medium caliber vessel which supplies a trivial first  diagonal Mykalah Saari proximal segment, medium caliber second diagonal Milos Milligan  thereafter. The LAD then extends to the apex. The LAD has moderate  disease, 50% of the proximal left segment, which then extends into a  narrowing of 70% encompassing the trivial diagonal Ronelle Michie. The distal  LAD has mild irregularities and is seen to fill predominantly via the  LIMA graft. There is mid narrowing of 30%. The second diagonal Lincon Sahlin  has an ostial narrowing of 50% and fills predominantly via antegrade  flow.  3. Left circumflex artery. This is a medium caliber vessel that supplies a  small first marginal Adriann Ballweg and the proximal segment a larger second  marginal Jazlyne Gauger. In the mid-section, there is moderate narrowing of 30-  40% of the proximal segment of the second marginal Elica Almas.  4. Right coronary artery. This was a dominant medium caliber vessel that  supplies the posterior descending artery and a posterior ventricular  Delvonte Berenson in its terminal segment. The right coronary artery is 100%  occluded in the mid-section. The distal vessel fills the in situ RIMA  graft anastomosed to the distal right coronary artery. The posterior  descending artery and the posterior ventricular Christophere Hillhouse have mild _______  of 30%.  5. RIMA to the distal right coronary artery is patent. This was an in situ  graft.  6. LIMA to the LAD is patent. This was also an in situ graft.  7. Left ventricle. Normal end-systolic and end-diastolic dimensions.  Normal left ventricular function is well preserved. Ejection fraction 50-  55%. No mitral regurgitation. LV pressure is 120/5. Aortic is 120/65.  LVEDP is 15.  ASSESSMENT AND PLAN: The patient is a  80 year old gentleman with two-vessel  coronary artery disease that is well revascularized surgically. He has well-  preserved LV function. The only concern is the second diagonal Suleika Donavan which  is compromised by moderate disease in the proximal and mid-LAD. This does  not appear to be critical in nature; however, this disease is presumptively  the reason the patient underwent bypass surgery. In addition, the amount of  myocardium supplied by this diagonal Jadyn Brasher is relatively small and is  unlikely to elicit the symptoms the patient presented with. We will thus  pursue a conservative course of medical therapy should the patient have  recurrent symptoms or an abnormal stress imaging study. Percutaneous  intervention may be considered to improve flow to the second diagonal  Kiley Solimine.   Jan 2010 Echo SUMMARY - Overall left ventricular systolic function was normal. Left ventricular ejection fraction was estimated , range being 55 % to 60 %. There were no left ventricular regional wall motion  abnormalities. Left ventricular wall thickness was mildly increased. There was moderate basal septal hypertrophy. - The aortic valve was mildly calcified. - There was mild fibrocalcific change of the aortic root. - The effective orifice of mitral regurgitation by proximal isovelocity surface area was 0.13 cm^2. The volume of mitral regurgitation by proximal isovelocity surface area was 19 cc. - The left atrium was moderately dilated. - There was mild right ventricular hypertrophy. - The right atrium was mild to moderately dilated.     Assessment and Plan  1. CAD - no current symptoms - continue risk factor modification and secondary prevention  2. Hyperlipidemia - continue high dose statin  3. Aflutter - no current symptoms, continue metoprolol and coumadin - he is not currently interested in NOACs      Arnoldo Lenis, M.D., F.A.C.C.

## 2014-06-14 ENCOUNTER — Telehealth: Payer: Self-pay | Admitting: *Deleted

## 2014-06-14 MED ORDER — ATORVASTATIN CALCIUM 80 MG PO TABS: ORAL_TABLET | ORAL | Status: DC

## 2014-06-14 NOTE — Telephone Encounter (Signed)
Refill complete 

## 2014-06-14 NOTE — Telephone Encounter (Signed)
[facility] FAX   Atorvastatin 80 mg #30

## 2014-06-26 ENCOUNTER — Ambulatory Visit (INDEPENDENT_AMBULATORY_CARE_PROVIDER_SITE_OTHER): Payer: Medicare Other | Admitting: *Deleted

## 2014-06-26 DIAGNOSIS — Z7901 Long term (current) use of anticoagulants: Secondary | ICD-10-CM

## 2014-06-26 DIAGNOSIS — Z5181 Encounter for therapeutic drug level monitoring: Secondary | ICD-10-CM

## 2014-06-26 DIAGNOSIS — I4892 Unspecified atrial flutter: Secondary | ICD-10-CM

## 2014-06-26 LAB — POCT INR: INR: 2.3

## 2014-07-04 ENCOUNTER — Telehealth: Payer: Self-pay | Admitting: *Deleted

## 2014-07-04 NOTE — Telephone Encounter (Signed)
Yes, it is ok to hold coumadin for 3 days prior to dental procedure.   Zandra Abts MD

## 2014-07-04 NOTE — Telephone Encounter (Signed)
Can pt hold coumadin 3 days for dental extractions?

## 2014-07-04 NOTE — Telephone Encounter (Signed)
Pt is going to have 4 teeth pulled and needs to know what to do about stopping coumadin

## 2014-07-04 NOTE — Telephone Encounter (Signed)
Called and spoke to pt's wife.  Dental extractions are scheduled for 07/10/14 at the [place] [facility].  Told to have pt take his last dose of coumadin on 10/31 and restart coumadin the night of procedure.  She verbalized understanding.  She confirmed with pt and he had no further questions.

## 2014-07-09 ENCOUNTER — Ambulatory Visit (INDEPENDENT_AMBULATORY_CARE_PROVIDER_SITE_OTHER): Payer: Medicare Other | Admitting: Urology

## 2014-07-09 DIAGNOSIS — N401 Enlarged prostate with lower urinary tract symptoms: Secondary | ICD-10-CM

## 2014-07-09 DIAGNOSIS — R312 Other microscopic hematuria: Secondary | ICD-10-CM

## 2014-07-09 DIAGNOSIS — N200 Calculus of kidney: Secondary | ICD-10-CM

## 2014-07-16 ENCOUNTER — Other Ambulatory Visit: Payer: Self-pay | Admitting: Urology

## 2014-07-16 DIAGNOSIS — R3129 Other microscopic hematuria: Secondary | ICD-10-CM

## 2014-07-24 ENCOUNTER — Ambulatory Visit (HOSPITAL_COMMUNITY)
Admission: RE | Admit: 2014-07-24 | Discharge: 2014-07-24 | Disposition: A | Discharge status: Home / Self Care | Payer: Medicare Other | Source: Ambulatory Visit | Attending: Urology | Admitting: Urology

## 2014-07-24 DIAGNOSIS — M5136 Other intervertebral disc degeneration, lumbar region: Secondary | ICD-10-CM | POA: Insufficient documentation

## 2014-07-24 DIAGNOSIS — K429 Umbilical hernia without obstruction or gangrene: Secondary | ICD-10-CM | POA: Diagnosis not present

## 2014-07-24 DIAGNOSIS — K5730 Diverticulosis of large intestine without perforation or abscess without bleeding: Secondary | ICD-10-CM | POA: Insufficient documentation

## 2014-07-24 DIAGNOSIS — K4020 Bilateral inguinal hernia, without obstruction or gangrene, not specified as recurrent: Secondary | ICD-10-CM | POA: Diagnosis not present

## 2014-07-24 DIAGNOSIS — R3129 Other microscopic hematuria: Secondary | ICD-10-CM

## 2014-07-24 DIAGNOSIS — R312 Other microscopic hematuria: Secondary | ICD-10-CM | POA: Insufficient documentation

## 2014-07-24 DIAGNOSIS — Z87442 Personal history of urinary calculi: Secondary | ICD-10-CM | POA: Diagnosis not present

## 2014-08-05 ENCOUNTER — Ambulatory Visit (INDEPENDENT_AMBULATORY_CARE_PROVIDER_SITE_OTHER): Payer: Medicare Other | Admitting: *Deleted

## 2014-08-05 DIAGNOSIS — Z5181 Encounter for therapeutic drug level monitoring: Secondary | ICD-10-CM

## 2014-08-05 DIAGNOSIS — Z7901 Long term (current) use of anticoagulants: Secondary | ICD-10-CM

## 2014-08-05 DIAGNOSIS — I4892 Unspecified atrial flutter: Secondary | ICD-10-CM

## 2014-08-05 LAB — POCT INR: INR: 2.7

## 2014-08-19 ENCOUNTER — Ambulatory Visit (INDEPENDENT_AMBULATORY_CARE_PROVIDER_SITE_OTHER): Payer: Medicare Other | Admitting: *Deleted

## 2014-08-19 DIAGNOSIS — I4892 Unspecified atrial flutter: Secondary | ICD-10-CM

## 2014-08-19 DIAGNOSIS — Z7901 Long term (current) use of anticoagulants: Secondary | ICD-10-CM

## 2014-08-19 DIAGNOSIS — Z5181 Encounter for therapeutic drug level monitoring: Secondary | ICD-10-CM

## 2014-08-19 LAB — POCT INR: INR: 2.4

## 2014-09-23 ENCOUNTER — Telehealth: Payer: Self-pay | Admitting: *Deleted

## 2014-09-23 NOTE — Telephone Encounter (Signed)
Pt is having dental work this week and needs to know if he needs to come off coumadin.

## 2014-09-23 NOTE — Telephone Encounter (Signed)
Spoke with wife.  Pt is just scheduled to have some fillings replaced.  Told her pt does not need to hold coumadin.  She verbalized understanding.

## 2014-09-30 ENCOUNTER — Ambulatory Visit (INDEPENDENT_AMBULATORY_CARE_PROVIDER_SITE_OTHER): Payer: Medicare HMO | Admitting: *Deleted

## 2014-09-30 DIAGNOSIS — Z7901 Long term (current) use of anticoagulants: Secondary | ICD-10-CM

## 2014-09-30 DIAGNOSIS — I4892 Unspecified atrial flutter: Secondary | ICD-10-CM

## 2014-09-30 DIAGNOSIS — Z5181 Encounter for therapeutic drug level monitoring: Secondary | ICD-10-CM

## 2014-09-30 LAB — POCT INR: INR: 2.2

## 2014-10-09 ENCOUNTER — Other Ambulatory Visit: Payer: Self-pay | Admitting: Cardiology

## 2014-11-06 ENCOUNTER — Ambulatory Visit (INDEPENDENT_AMBULATORY_CARE_PROVIDER_SITE_OTHER): Payer: Medicare HMO | Admitting: *Deleted

## 2014-11-06 DIAGNOSIS — Z7901 Long term (current) use of anticoagulants: Secondary | ICD-10-CM

## 2014-11-06 DIAGNOSIS — Z5181 Encounter for therapeutic drug level monitoring: Secondary | ICD-10-CM

## 2014-11-06 DIAGNOSIS — I4892 Unspecified atrial flutter: Secondary | ICD-10-CM

## 2014-11-06 LAB — POCT INR: INR: 2.6

## 2014-11-20 ENCOUNTER — Other Ambulatory Visit: Payer: Self-pay | Admitting: Cardiology

## 2014-12-18 ENCOUNTER — Ambulatory Visit (INDEPENDENT_AMBULATORY_CARE_PROVIDER_SITE_OTHER): Payer: Medicare HMO | Admitting: *Deleted

## 2014-12-18 DIAGNOSIS — I4892 Unspecified atrial flutter: Secondary | ICD-10-CM | POA: Diagnosis not present

## 2014-12-18 DIAGNOSIS — Z5181 Encounter for therapeutic drug level monitoring: Secondary | ICD-10-CM

## 2014-12-18 DIAGNOSIS — Z7901 Long term (current) use of anticoagulants: Secondary | ICD-10-CM

## 2014-12-18 LAB — POCT INR: INR: 2

## 2015-01-29 ENCOUNTER — Ambulatory Visit (INDEPENDENT_AMBULATORY_CARE_PROVIDER_SITE_OTHER): Payer: Medicare HMO | Admitting: *Deleted

## 2015-01-29 DIAGNOSIS — Z5181 Encounter for therapeutic drug level monitoring: Secondary | ICD-10-CM

## 2015-01-29 DIAGNOSIS — I4892 Unspecified atrial flutter: Secondary | ICD-10-CM

## 2015-01-29 DIAGNOSIS — Z7901 Long term (current) use of anticoagulants: Secondary | ICD-10-CM | POA: Diagnosis not present

## 2015-01-29 LAB — POCT INR: INR: 2.5

## 2015-03-12 ENCOUNTER — Ambulatory Visit (INDEPENDENT_AMBULATORY_CARE_PROVIDER_SITE_OTHER): Payer: Medicare HMO | Admitting: *Deleted

## 2015-03-12 DIAGNOSIS — I4892 Unspecified atrial flutter: Secondary | ICD-10-CM | POA: Diagnosis not present

## 2015-03-12 DIAGNOSIS — Z5181 Encounter for therapeutic drug level monitoring: Secondary | ICD-10-CM | POA: Diagnosis not present

## 2015-03-12 DIAGNOSIS — Z7901 Long term (current) use of anticoagulants: Secondary | ICD-10-CM

## 2015-03-12 LAB — POCT INR: INR: 2.3

## 2015-04-08 ENCOUNTER — Other Ambulatory Visit: Payer: Self-pay | Admitting: Cardiology

## 2015-04-11 ENCOUNTER — Other Ambulatory Visit (HOSPITAL_COMMUNITY): Payer: Self-pay | Admitting: Internal Medicine

## 2015-04-11 DIAGNOSIS — R319 Hematuria, unspecified: Secondary | ICD-10-CM

## 2015-04-15 ENCOUNTER — Ambulatory Visit (HOSPITAL_COMMUNITY)
Admission: RE | Admit: 2015-04-15 | Discharge: 2015-04-15 | Disposition: A | Discharge status: Home / Self Care | Payer: Medicare HMO | Source: Ambulatory Visit | Attending: Internal Medicine | Admitting: Internal Medicine

## 2015-04-15 DIAGNOSIS — N200 Calculus of kidney: Secondary | ICD-10-CM | POA: Diagnosis not present

## 2015-04-15 DIAGNOSIS — R319 Hematuria, unspecified: Secondary | ICD-10-CM | POA: Diagnosis present

## 2015-04-15 DIAGNOSIS — K5730 Diverticulosis of large intestine without perforation or abscess without bleeding: Secondary | ICD-10-CM | POA: Insufficient documentation

## 2015-04-23 ENCOUNTER — Ambulatory Visit (INDEPENDENT_AMBULATORY_CARE_PROVIDER_SITE_OTHER): Payer: Medicare HMO | Admitting: *Deleted

## 2015-04-23 DIAGNOSIS — Z7901 Long term (current) use of anticoagulants: Secondary | ICD-10-CM

## 2015-04-23 DIAGNOSIS — I4892 Unspecified atrial flutter: Secondary | ICD-10-CM | POA: Diagnosis not present

## 2015-04-23 DIAGNOSIS — Z5181 Encounter for therapeutic drug level monitoring: Secondary | ICD-10-CM

## 2015-04-23 LAB — POCT INR: INR: 2.4

## 2015-05-08 ENCOUNTER — Ambulatory Visit (INDEPENDENT_AMBULATORY_CARE_PROVIDER_SITE_OTHER): Payer: Medicare HMO | Admitting: Otolaryngology

## 2015-05-08 ENCOUNTER — Other Ambulatory Visit: Payer: Self-pay | Admitting: Cardiology

## 2015-05-08 DIAGNOSIS — H903 Sensorineural hearing loss, bilateral: Secondary | ICD-10-CM

## 2015-06-03 ENCOUNTER — Other Ambulatory Visit: Payer: Self-pay | Admitting: Cardiology

## 2015-06-18 ENCOUNTER — Ambulatory Visit (INDEPENDENT_AMBULATORY_CARE_PROVIDER_SITE_OTHER): Payer: Medicare HMO | Admitting: *Deleted

## 2015-06-18 ENCOUNTER — Ambulatory Visit (INDEPENDENT_AMBULATORY_CARE_PROVIDER_SITE_OTHER): Payer: Medicare HMO | Admitting: Cardiology

## 2015-06-18 VITALS — BP 116/58 | HR 59 | Ht 68.0 in | Wt 184.4 lb

## 2015-06-18 DIAGNOSIS — I4892 Unspecified atrial flutter: Secondary | ICD-10-CM

## 2015-06-18 DIAGNOSIS — I2510 Atherosclerotic heart disease of native coronary artery without angina pectoris: Secondary | ICD-10-CM

## 2015-06-18 DIAGNOSIS — Z7901 Long term (current) use of anticoagulants: Secondary | ICD-10-CM

## 2015-06-18 DIAGNOSIS — I4891 Unspecified atrial fibrillation: Secondary | ICD-10-CM | POA: Diagnosis not present

## 2015-06-18 DIAGNOSIS — Z5181 Encounter for therapeutic drug level monitoring: Secondary | ICD-10-CM

## 2015-06-18 LAB — POCT INR: INR: 2.6

## 2015-06-18 NOTE — Progress Notes (Signed)
Patient ID: Shane [name], male   DOB: Oct 04, 1932, 81 y.o.   MRN: [id number]     Clinical Summary Mr. Delfavero is a 81 y.o.male seen today for follow up of the following medical problems.   1. CAD - prior CABG in 1990 - last cath 2003 as reported below. - Jan 2010 echo LVEF 55-60%  - denies any chest pain, no SOB or DOE. Walks 4+ miles daily without troubles - compliant with meds   2. Hyperlipidemia - compliant with lipitor - due for labs with pcp in January  3. Aflutter - denies any recent palpitations - denies any bleeding  troubles with coumadin.  Past Medical History  Diagnosis Date  . Arteriosclerotic cardiovascular disease (ASCVD)     CABG surgery in 1990. MI in 1986; coronary angiography in 2003-critical LAD with patent LIMA; total obstruction of the RCA with patent RIMA; low normal EF.  [name] Hyperlipidemia     Lipid profile in 09/2010:196, 84, 54, 125  . Atrial flutter, paroxysmal     asymptomatic; onset in 2010; 4:1 AVB with low-dose metoprolol; moderate left      atrial enlargement and mild left ventricular hypertrophy with normal ejection fraction by echocardiography  . Chronic anticoagulation   . Benign prostatic hypertrophy   . Nephrolithiasis   . Polymyalgia rheumatica      No Known Allergies   Current Outpatient Prescriptions  Medication Sig Dispense Refill  . acetaminophen (TYLENOL EX ST ARTHRITIS PAIN) 500 MG tablet Take 500 mg by mouth as needed.      [name] atorvastatin (LIPITOR) 80 MG tablet TAKE ONE TABLET BY MOUTH ONCE DAILY. 30 tablet 3  . metoprolol (LOPRESSOR) 25 MG tablet Take 25 mg by mouth 2 (two) times daily.     . Tamsulosin HCl (FLOMAX) 0.4 MG CAPS Take 0.4 mg by mouth daily.      [name] warfarin (COUMADIN) 5 MG tablet TAKE 1&1/2 TABLETS BY MOUTH DAILY OR AS DIRECTED BY CLINIC. 45 tablet 6   No current facility-administered medications for this visit.     Past Surgical History  Procedure Laterality Date  . Coronary artery bypass graft  1990  .  Lumbar disc surgery    . Colonoscopy  08/21/01    RMR: Left-sided diverticulum.  Remainder of colonic mucosa appeared normal  . Colonoscopy N/A 07/31/2013    Procedure: COLONOSCOPY;  Surgeon: Danie Binder, MD;  Location[facility];  Service: Endoscopy;  Laterality: N/A;  10:30     No Known Allergies    Family History  Problem Relation Age of Onset  . Heart attack Mother   . Coronary artery disease Brother     CABG + pacemaker  . Pneumonia Brother     infant death  . Colon cancer Neg Hx   . Colon polyps Neg Hx      Social History Mr. Dallman reports that he quit smoking about 32 years ago. He has never used smokeless tobacco. Mr. Villanueva reports that he drinks alcohol.   Review of Systems CONSTITUTIONAL: No weight loss, fever, chills, weakness or fatigue.  HEENT: Eyes: No visual loss, blurred vision, double vision or yellow sclerae.No hearing loss, sneezing, congestion, runny nose or sore throat.  SKIN: No rash or itching.  CARDIOVASCULAR: per HPI RESPIRATORY: No shortness of breath, cough or sputum.  GASTROINTESTINAL: No anorexia, nausea, vomiting or diarrhea. No abdominal pain or blood.  GENITOURINARY: No burning on urination, no polyuria NEUROLOGICAL: No headache, dizziness, syncope, paralysis, ataxia, numbness or tingling  in the extremities. No change in bowel or bladder control.  MUSCULOSKELETAL: No muscle, back pain, joint pain or stiffness.  LYMPHATICS: No enlarged nodes. No history of splenectomy.  PSYCHIATRIC: No history of depression or anxiety.  ENDOCRINOLOGIC: No reports of sweating, cold or heat intolerance. No polyuria or polydipsia.  [name]   Physical Examination Filed Vitals:   06/18/15 1442  BP: 116/58  Pulse: 59   Filed Vitals:   06/18/15 1442  Height: 5\' 8"  (1.727 m)  Weight: 184 lb 6.4 oz (83.643 kg)    Gen: resting comfortably, no acute distress HEENT: no scleral icterus, pupils equal round and reactive, no palptable cervical adenopathy,  CV:  RRR, 2/6 sysotlic murmur RUSB, no jvd Resp: Clear to auscultation bilaterally GI: abdomen is soft, non-tender, non-distended, normal bowel sounds, no hepatosplenomegaly MSK: extremities are warm, no edema.  Skin: warm, no rash Neuro:  no focal deficits Psych: appropriate affect   Diagnostic Studies Cath 07/2002 FINDINGS:  1. Left main trunk. Medium caliber vessel with mild ________.  2. LAD. This is a medium caliber vessel which supplies a trivial first  diagonal Erna Brossard proximal segment, medium caliber second diagonal Sayge Salvato  thereafter. The LAD then extends to the apex. The LAD has moderate  disease, 50% of the proximal left segment, which then extends into a  narrowing of 70% encompassing the trivial diagonal Imane Burrough. The distal  LAD has mild irregularities and is seen to fill predominantly via the  LIMA graft. There is mid narrowing of 30%. The second diagonal Nisreen Guise  has an ostial narrowing of 50% and fills predominantly via antegrade  flow.  3. Left circumflex artery. This is a medium caliber vessel that supplies a  small first marginal Zayli Villafuerte and the proximal segment a larger second  marginal Gordie Crumby. In the mid-section, there is moderate narrowing of 30-  40% of the proximal segment of the second marginal Jyles Sontag.  4. Right coronary artery. This was a dominant medium caliber vessel that  supplies the posterior descending artery and a posterior ventricular  Thamar Holik in its terminal segment. The right coronary artery is 100%  occluded in the mid-section. The distal vessel fills the in situ RIMA  graft anastomosed to the distal right coronary artery. The posterior  descending artery and the posterior ventricular Mckinzie Saksa have mild _______  of 30%.  5. RIMA to the distal right coronary artery is patent. This was an in situ  graft.  6. LIMA to the LAD is patent. This was also an in situ graft.  7. Left ventricle. Normal end-systolic and end-diastolic  dimensions.  Normal left ventricular function is well preserved. Ejection fraction 50-  55%. No mitral regurgitation. LV pressure is 120/5. Aortic is 120/65.  LVEDP is 15.  ASSESSMENT AND PLAN: The patient is a 81 year old gentleman with two-vessel  coronary artery disease that is well revascularized surgically. He has well-  preserved LV function. The only concern is the second diagonal Elad Macphail which  is compromised by moderate disease in the proximal and mid-LAD. This does  not appear to be critical in nature; however, this disease is presumptively  the reason the patient underwent bypass surgery. In addition, the amount of  myocardium supplied by this diagonal Tyashia Morrisette is relatively small and is  unlikely to elicit the symptoms the patient presented with. We will thus  pursue a conservative course of medical therapy should the patient have  recurrent symptoms or an abnormal stress imaging study. Percutaneous  intervention may be considered to improve flow  to the second diagonal  Ricki Clack.   Jan 2010 Echo SUMMARY - Overall left ventricular systolic function was normal. Left ventricular ejection fraction was estimated , range being 55 % to 60 %. There were no left ventricular regional wall motion abnormalities. Left ventricular wall thickness was mildly increased. There was moderate basal septal hypertrophy. - The aortic valve was mildly calcified. - There was mild fibrocalcific change of the aortic root. - The effective orifice of mitral regurgitation by proximal isovelocity surface area was 0.13 cm^2. The volume of mitral regurgitation by proximal isovelocity surface area was 19 cc. - The left atrium was moderately dilated. - There was mild right ventricular hypertrophy. - The right atrium was mild to moderately dilated.        Assessment and Plan  1. CAD - no current symptoms - continue risk factor modification and secondary prevention  2. Hyperlipidemia -  continue high dose statin - has upcoming panel with pcp in January  3. Aflutter - no current symptoms, continue metoprolol and coumadin - he is not currently interested in NOACs   F/u 1 year   Arnoldo Lenis, M.D.

## 2015-06-18 NOTE — Patient Instructions (Signed)
Medication Instructions:  Your physician recommends that you continue on your current medications as directed. Please refer to the Current Medication list given to you today.  Labwork: None ordered  Testing/Procedures: None ordered  Follow-Up: Your physician wants you to follow-up in: 1 year with Dr. Harl Bowie. You will receive a reminder letter in the mail two months in advance. If you don't receive a letter, please call our office to schedule the follow-up appointment.  Any Other Special Instructions Will Be Listed Below (If Applicable). Thank you for choosing [facility]!!

## 2015-06-20 ENCOUNTER — Encounter: Payer: Self-pay | Admitting: Cardiology

## 2015-07-07 ENCOUNTER — Other Ambulatory Visit: Payer: Self-pay | Admitting: Cardiology

## 2015-07-30 ENCOUNTER — Ambulatory Visit (INDEPENDENT_AMBULATORY_CARE_PROVIDER_SITE_OTHER): Payer: Medicare HMO | Admitting: *Deleted

## 2015-07-30 DIAGNOSIS — I4892 Unspecified atrial flutter: Secondary | ICD-10-CM | POA: Diagnosis not present

## 2015-07-30 DIAGNOSIS — Z5181 Encounter for therapeutic drug level monitoring: Secondary | ICD-10-CM

## 2015-07-30 DIAGNOSIS — Z7901 Long term (current) use of anticoagulants: Secondary | ICD-10-CM

## 2015-07-30 LAB — POCT INR: INR: 1.9

## 2015-08-27 ENCOUNTER — Ambulatory Visit (INDEPENDENT_AMBULATORY_CARE_PROVIDER_SITE_OTHER): Payer: Medicare HMO | Admitting: Pharmacist

## 2015-08-27 DIAGNOSIS — I4892 Unspecified atrial flutter: Secondary | ICD-10-CM | POA: Diagnosis not present

## 2015-08-27 DIAGNOSIS — Z5181 Encounter for therapeutic drug level monitoring: Secondary | ICD-10-CM | POA: Diagnosis not present

## 2015-08-27 DIAGNOSIS — Z7901 Long term (current) use of anticoagulants: Secondary | ICD-10-CM | POA: Diagnosis not present

## 2015-08-27 LAB — POCT INR: INR: 2.6

## 2015-10-08 ENCOUNTER — Ambulatory Visit (INDEPENDENT_AMBULATORY_CARE_PROVIDER_SITE_OTHER): Payer: Medicare HMO | Admitting: *Deleted

## 2015-10-08 DIAGNOSIS — Z7901 Long term (current) use of anticoagulants: Secondary | ICD-10-CM

## 2015-10-08 DIAGNOSIS — I4892 Unspecified atrial flutter: Secondary | ICD-10-CM

## 2015-10-08 DIAGNOSIS — Z5181 Encounter for therapeutic drug level monitoring: Secondary | ICD-10-CM | POA: Diagnosis not present

## 2015-10-08 LAB — POCT INR: INR: 2.2

## 2015-10-11 DIAGNOSIS — M353 Polymyalgia rheumatica: Secondary | ICD-10-CM | POA: Diagnosis not present

## 2015-10-11 DIAGNOSIS — I2510 Atherosclerotic heart disease of native coronary artery without angina pectoris: Secondary | ICD-10-CM | POA: Diagnosis not present

## 2015-10-11 DIAGNOSIS — E785 Hyperlipidemia, unspecified: Secondary | ICD-10-CM | POA: Diagnosis not present

## 2015-10-11 DIAGNOSIS — Z79899 Other long term (current) drug therapy: Secondary | ICD-10-CM | POA: Diagnosis not present

## 2015-10-16 DIAGNOSIS — Z6828 Body mass index (BMI) 28.0-28.9, adult: Secondary | ICD-10-CM | POA: Diagnosis not present

## 2015-10-16 DIAGNOSIS — I2510 Atherosclerotic heart disease of native coronary artery without angina pectoris: Secondary | ICD-10-CM | POA: Diagnosis not present

## 2015-10-16 DIAGNOSIS — I700 Atherosclerosis of aorta: Secondary | ICD-10-CM | POA: Diagnosis not present

## 2015-10-16 DIAGNOSIS — I4892 Unspecified atrial flutter: Secondary | ICD-10-CM | POA: Diagnosis not present

## 2015-10-17 ENCOUNTER — Telehealth: Payer: Self-pay | Admitting: Cardiology

## 2015-10-17 NOTE — Telephone Encounter (Signed)
Notified by Dr Willey Blade about abnormal EKG for patient as his recent appointment. EKG shows sinus bradycardia with mobitz I second degree block, there is a junctional escape beat present as well. Patient is asymptomatic with normal blood pressure. On low dose lopressor for his history of paroxsymal aflutter. We will decrease lopressor to 12.5mg  bid and notify patient by phone.    Shane Abts MD

## 2015-10-20 ENCOUNTER — Telehealth: Payer: Self-pay | Admitting: *Deleted

## 2015-10-20 NOTE — Telephone Encounter (Signed)
Called patient with test results. No answer. Left message to call back.  

## 2015-10-20 NOTE — Telephone Encounter (Signed)
-----   Message from Arnoldo Lenis, MD sent at 10/17/2015  3:12 PM EST ----- Please let patient know that I spoke with Dr Willey Blade and reviewed his most recent EKG. His heart rate is on the low side, please confirm he is taking lopressor 25mg  bid and have him decrease it to 12.5mg  bid   Zandra Abts MD

## 2015-10-21 MED ORDER — METOPROLOL TARTRATE 25 MG PO TABS: 12.5000 mg | ORAL_TABLET | Freq: Two times a day (BID) | ORAL | Status: DC

## 2015-11-19 ENCOUNTER — Ambulatory Visit (INDEPENDENT_AMBULATORY_CARE_PROVIDER_SITE_OTHER): Payer: Medicare HMO | Admitting: *Deleted

## 2015-11-19 DIAGNOSIS — I4892 Unspecified atrial flutter: Secondary | ICD-10-CM

## 2015-11-19 DIAGNOSIS — Z5181 Encounter for therapeutic drug level monitoring: Secondary | ICD-10-CM | POA: Diagnosis not present

## 2015-11-19 DIAGNOSIS — Z7901 Long term (current) use of anticoagulants: Secondary | ICD-10-CM

## 2015-11-19 LAB — POCT INR: INR: 2.3

## 2015-12-31 ENCOUNTER — Ambulatory Visit (INDEPENDENT_AMBULATORY_CARE_PROVIDER_SITE_OTHER): Payer: Medicare HMO | Admitting: *Deleted

## 2015-12-31 DIAGNOSIS — Z7901 Long term (current) use of anticoagulants: Secondary | ICD-10-CM

## 2015-12-31 DIAGNOSIS — Z5181 Encounter for therapeutic drug level monitoring: Secondary | ICD-10-CM

## 2015-12-31 DIAGNOSIS — I4892 Unspecified atrial flutter: Secondary | ICD-10-CM

## 2015-12-31 LAB — POCT INR: INR: 3.5

## 2016-01-14 ENCOUNTER — Ambulatory Visit (INDEPENDENT_AMBULATORY_CARE_PROVIDER_SITE_OTHER): Payer: Medicare HMO | Admitting: *Deleted

## 2016-01-14 DIAGNOSIS — Z7901 Long term (current) use of anticoagulants: Secondary | ICD-10-CM

## 2016-01-14 DIAGNOSIS — Z5181 Encounter for therapeutic drug level monitoring: Secondary | ICD-10-CM

## 2016-01-14 DIAGNOSIS — I4892 Unspecified atrial flutter: Secondary | ICD-10-CM

## 2016-01-14 LAB — POCT INR: INR: 2.7

## 2016-02-04 ENCOUNTER — Other Ambulatory Visit: Payer: Self-pay

## 2016-02-04 ENCOUNTER — Other Ambulatory Visit: Payer: Self-pay | Admitting: *Deleted

## 2016-02-04 MED ORDER — WARFARIN SODIUM 5 MG PO TABS: ORAL_TABLET | ORAL | Status: DC

## 2016-02-06 ENCOUNTER — Other Ambulatory Visit: Payer: Self-pay | Admitting: *Deleted

## 2016-02-06 ENCOUNTER — Telehealth: Payer: Self-pay | Admitting: *Deleted

## 2016-02-06 MED ORDER — WARFARIN SODIUM 5 MG PO TABS: ORAL_TABLET | ORAL | Status: DC

## 2016-02-06 NOTE — Telephone Encounter (Signed)
Pt is needing his warfarin (COUMADIN) 5 MG tablet [id number]  Rx [id number] sent to [facility]

## 2016-02-06 NOTE — Telephone Encounter (Signed)
RX for warfarin sent to [facility]

## 2016-02-11 ENCOUNTER — Ambulatory Visit (INDEPENDENT_AMBULATORY_CARE_PROVIDER_SITE_OTHER): Payer: Medicare HMO | Admitting: *Deleted

## 2016-02-11 DIAGNOSIS — I4892 Unspecified atrial flutter: Secondary | ICD-10-CM

## 2016-02-11 DIAGNOSIS — Z7901 Long term (current) use of anticoagulants: Secondary | ICD-10-CM | POA: Diagnosis not present

## 2016-02-11 DIAGNOSIS — Z5181 Encounter for therapeutic drug level monitoring: Secondary | ICD-10-CM | POA: Diagnosis not present

## 2016-02-11 LAB — POCT INR: INR: 3.8

## 2016-02-16 ENCOUNTER — Other Ambulatory Visit: Payer: Self-pay

## 2016-02-16 MED ORDER — ATORVASTATIN CALCIUM 80 MG PO TABS: 80.0000 mg | ORAL_TABLET | Freq: Every day | ORAL | Status: DC

## 2016-02-16 NOTE — Telephone Encounter (Signed)
Refill complete 

## 2016-03-03 ENCOUNTER — Ambulatory Visit (INDEPENDENT_AMBULATORY_CARE_PROVIDER_SITE_OTHER): Payer: Medicare HMO | Admitting: *Deleted

## 2016-03-03 DIAGNOSIS — Z7901 Long term (current) use of anticoagulants: Secondary | ICD-10-CM

## 2016-03-03 DIAGNOSIS — I4892 Unspecified atrial flutter: Secondary | ICD-10-CM | POA: Diagnosis not present

## 2016-03-03 DIAGNOSIS — Z5181 Encounter for therapeutic drug level monitoring: Secondary | ICD-10-CM

## 2016-03-03 LAB — POCT INR: INR: 2.6

## 2016-03-31 ENCOUNTER — Ambulatory Visit (INDEPENDENT_AMBULATORY_CARE_PROVIDER_SITE_OTHER): Payer: Medicare HMO | Admitting: *Deleted

## 2016-03-31 DIAGNOSIS — I4892 Unspecified atrial flutter: Secondary | ICD-10-CM

## 2016-03-31 DIAGNOSIS — Z7901 Long term (current) use of anticoagulants: Secondary | ICD-10-CM | POA: Diagnosis not present

## 2016-03-31 DIAGNOSIS — Z5181 Encounter for therapeutic drug level monitoring: Secondary | ICD-10-CM

## 2016-03-31 LAB — POCT INR: INR: 3.7

## 2016-04-15 DIAGNOSIS — I2510 Atherosclerotic heart disease of native coronary artery without angina pectoris: Secondary | ICD-10-CM | POA: Diagnosis not present

## 2016-04-15 DIAGNOSIS — I480 Paroxysmal atrial fibrillation: Secondary | ICD-10-CM | POA: Diagnosis not present

## 2016-04-21 ENCOUNTER — Ambulatory Visit (INDEPENDENT_AMBULATORY_CARE_PROVIDER_SITE_OTHER): Payer: Medicare HMO | Admitting: *Deleted

## 2016-04-21 DIAGNOSIS — I4892 Unspecified atrial flutter: Secondary | ICD-10-CM

## 2016-04-21 DIAGNOSIS — Z5181 Encounter for therapeutic drug level monitoring: Secondary | ICD-10-CM

## 2016-04-21 DIAGNOSIS — Z7901 Long term (current) use of anticoagulants: Secondary | ICD-10-CM | POA: Diagnosis not present

## 2016-04-21 LAB — POCT INR: INR: 3.2

## 2016-04-22 DIAGNOSIS — M25571 Pain in right ankle and joints of right foot: Secondary | ICD-10-CM | POA: Diagnosis not present

## 2016-04-22 DIAGNOSIS — B070 Plantar wart: Secondary | ICD-10-CM | POA: Diagnosis not present

## 2016-04-22 DIAGNOSIS — M79671 Pain in right foot: Secondary | ICD-10-CM | POA: Diagnosis not present

## 2016-04-23 DIAGNOSIS — H6121 Impacted cerumen, right ear: Secondary | ICD-10-CM | POA: Diagnosis not present

## 2016-05-12 ENCOUNTER — Ambulatory Visit (INDEPENDENT_AMBULATORY_CARE_PROVIDER_SITE_OTHER): Payer: Medicare HMO | Admitting: *Deleted

## 2016-05-12 DIAGNOSIS — I4892 Unspecified atrial flutter: Secondary | ICD-10-CM

## 2016-05-12 DIAGNOSIS — Z5181 Encounter for therapeutic drug level monitoring: Secondary | ICD-10-CM

## 2016-05-12 DIAGNOSIS — Z7901 Long term (current) use of anticoagulants: Secondary | ICD-10-CM | POA: Diagnosis not present

## 2016-05-12 LAB — POCT INR: INR: 2

## 2016-05-17 ENCOUNTER — Ambulatory Visit (INDEPENDENT_AMBULATORY_CARE_PROVIDER_SITE_OTHER): Payer: Medicare HMO | Admitting: Otolaryngology

## 2016-05-17 DIAGNOSIS — H903 Sensorineural hearing loss, bilateral: Secondary | ICD-10-CM

## 2016-05-28 DIAGNOSIS — Z0000 Encounter for general adult medical examination without abnormal findings: Secondary | ICD-10-CM | POA: Diagnosis not present

## 2016-05-28 DIAGNOSIS — E7800 Pure hypercholesterolemia, unspecified: Secondary | ICD-10-CM | POA: Diagnosis not present

## 2016-05-28 DIAGNOSIS — I2510 Atherosclerotic heart disease of native coronary artery without angina pectoris: Secondary | ICD-10-CM | POA: Diagnosis not present

## 2016-06-03 DIAGNOSIS — R69 Illness, unspecified: Secondary | ICD-10-CM | POA: Diagnosis not present

## 2016-06-09 ENCOUNTER — Ambulatory Visit (INDEPENDENT_AMBULATORY_CARE_PROVIDER_SITE_OTHER): Payer: Medicare HMO | Admitting: *Deleted

## 2016-06-09 DIAGNOSIS — Z5181 Encounter for therapeutic drug level monitoring: Secondary | ICD-10-CM

## 2016-06-09 DIAGNOSIS — I4892 Unspecified atrial flutter: Secondary | ICD-10-CM

## 2016-06-09 DIAGNOSIS — Z7901 Long term (current) use of anticoagulants: Secondary | ICD-10-CM | POA: Diagnosis not present

## 2016-06-09 LAB — POCT INR: INR: 2.1

## 2016-06-10 DIAGNOSIS — L578 Other skin changes due to chronic exposure to nonionizing radiation: Secondary | ICD-10-CM | POA: Diagnosis not present

## 2016-06-10 DIAGNOSIS — L720 Epidermal cyst: Secondary | ICD-10-CM | POA: Diagnosis not present

## 2016-06-10 DIAGNOSIS — D485 Neoplasm of uncertain behavior of skin: Secondary | ICD-10-CM | POA: Diagnosis not present

## 2016-06-10 DIAGNOSIS — L739 Follicular disorder, unspecified: Secondary | ICD-10-CM | POA: Diagnosis not present

## 2016-07-14 ENCOUNTER — Ambulatory Visit (INDEPENDENT_AMBULATORY_CARE_PROVIDER_SITE_OTHER): Payer: Medicare HMO | Admitting: *Deleted

## 2016-07-14 DIAGNOSIS — I4892 Unspecified atrial flutter: Secondary | ICD-10-CM | POA: Diagnosis not present

## 2016-07-14 DIAGNOSIS — Z7901 Long term (current) use of anticoagulants: Secondary | ICD-10-CM

## 2016-07-14 DIAGNOSIS — Z5181 Encounter for therapeutic drug level monitoring: Secondary | ICD-10-CM | POA: Diagnosis not present

## 2016-07-14 LAB — POCT INR: INR: 2

## 2016-08-11 ENCOUNTER — Ambulatory Visit (INDEPENDENT_AMBULATORY_CARE_PROVIDER_SITE_OTHER): Payer: Medicare HMO | Admitting: *Deleted

## 2016-08-11 DIAGNOSIS — Z7901 Long term (current) use of anticoagulants: Secondary | ICD-10-CM | POA: Diagnosis not present

## 2016-08-11 DIAGNOSIS — I4892 Unspecified atrial flutter: Secondary | ICD-10-CM

## 2016-08-11 DIAGNOSIS — Z5181 Encounter for therapeutic drug level monitoring: Secondary | ICD-10-CM | POA: Diagnosis not present

## 2016-08-11 LAB — POCT INR: INR: 3.4

## 2016-09-13 ENCOUNTER — Ambulatory Visit (INDEPENDENT_AMBULATORY_CARE_PROVIDER_SITE_OTHER): Payer: Medicare HMO | Admitting: *Deleted

## 2016-09-13 DIAGNOSIS — I4892 Unspecified atrial flutter: Secondary | ICD-10-CM | POA: Diagnosis not present

## 2016-09-13 DIAGNOSIS — Z7901 Long term (current) use of anticoagulants: Secondary | ICD-10-CM

## 2016-09-13 DIAGNOSIS — Z5181 Encounter for therapeutic drug level monitoring: Secondary | ICD-10-CM

## 2016-09-13 LAB — POCT INR: INR: 2.6

## 2016-09-16 DIAGNOSIS — Z87891 Personal history of nicotine dependence: Secondary | ICD-10-CM | POA: Diagnosis not present

## 2016-09-16 DIAGNOSIS — Z0000 Encounter for general adult medical examination without abnormal findings: Secondary | ICD-10-CM | POA: Diagnosis not present

## 2016-09-16 DIAGNOSIS — N400 Enlarged prostate without lower urinary tract symptoms: Secondary | ICD-10-CM | POA: Diagnosis not present

## 2016-09-16 DIAGNOSIS — Z8774 Personal history of (corrected) congenital malformations of heart and circulatory system: Secondary | ICD-10-CM | POA: Diagnosis not present

## 2016-09-16 DIAGNOSIS — E7800 Pure hypercholesterolemia, unspecified: Secondary | ICD-10-CM | POA: Diagnosis not present

## 2016-09-16 DIAGNOSIS — Z79899 Other long term (current) drug therapy: Secondary | ICD-10-CM | POA: Diagnosis not present

## 2016-09-16 DIAGNOSIS — I2510 Atherosclerotic heart disease of native coronary artery without angina pectoris: Secondary | ICD-10-CM | POA: Diagnosis not present

## 2016-09-16 DIAGNOSIS — Z6828 Body mass index (BMI) 28.0-28.9, adult: Secondary | ICD-10-CM | POA: Diagnosis not present

## 2016-09-16 DIAGNOSIS — Z7901 Long term (current) use of anticoagulants: Secondary | ICD-10-CM | POA: Diagnosis not present

## 2016-09-16 DIAGNOSIS — Z959 Presence of cardiac and vascular implant and graft, unspecified: Secondary | ICD-10-CM | POA: Diagnosis not present

## 2016-10-04 ENCOUNTER — Other Ambulatory Visit: Payer: Self-pay | Admitting: Cardiology

## 2016-10-11 ENCOUNTER — Ambulatory Visit (INDEPENDENT_AMBULATORY_CARE_PROVIDER_SITE_OTHER): Payer: Medicare HMO | Admitting: *Deleted

## 2016-10-11 DIAGNOSIS — Z7901 Long term (current) use of anticoagulants: Secondary | ICD-10-CM | POA: Diagnosis not present

## 2016-10-11 DIAGNOSIS — I4892 Unspecified atrial flutter: Secondary | ICD-10-CM

## 2016-10-11 DIAGNOSIS — Z5181 Encounter for therapeutic drug level monitoring: Secondary | ICD-10-CM | POA: Diagnosis not present

## 2016-10-11 LAB — POCT INR: INR: 2.3

## 2016-10-14 DIAGNOSIS — M353 Polymyalgia rheumatica: Secondary | ICD-10-CM | POA: Diagnosis not present

## 2016-10-14 DIAGNOSIS — E785 Hyperlipidemia, unspecified: Secondary | ICD-10-CM | POA: Diagnosis not present

## 2016-10-14 DIAGNOSIS — Z79899 Other long term (current) drug therapy: Secondary | ICD-10-CM | POA: Diagnosis not present

## 2016-10-14 DIAGNOSIS — I2510 Atherosclerotic heart disease of native coronary artery without angina pectoris: Secondary | ICD-10-CM | POA: Diagnosis not present

## 2016-10-25 DIAGNOSIS — E785 Hyperlipidemia, unspecified: Secondary | ICD-10-CM | POA: Diagnosis not present

## 2016-10-25 DIAGNOSIS — I2510 Atherosclerotic heart disease of native coronary artery without angina pectoris: Secondary | ICD-10-CM | POA: Diagnosis not present

## 2016-10-25 DIAGNOSIS — Z0001 Encounter for general adult medical examination with abnormal findings: Secondary | ICD-10-CM | POA: Diagnosis not present

## 2016-10-25 DIAGNOSIS — I480 Paroxysmal atrial fibrillation: Secondary | ICD-10-CM | POA: Diagnosis not present

## 2016-11-22 ENCOUNTER — Ambulatory Visit (INDEPENDENT_AMBULATORY_CARE_PROVIDER_SITE_OTHER): Payer: Medicare HMO | Admitting: *Deleted

## 2016-11-22 DIAGNOSIS — I4892 Unspecified atrial flutter: Secondary | ICD-10-CM | POA: Diagnosis not present

## 2016-11-22 DIAGNOSIS — Z7901 Long term (current) use of anticoagulants: Secondary | ICD-10-CM | POA: Diagnosis not present

## 2016-11-22 DIAGNOSIS — Z5181 Encounter for therapeutic drug level monitoring: Secondary | ICD-10-CM | POA: Diagnosis not present

## 2016-11-22 LAB — POCT INR: INR: 2.9

## 2016-11-24 ENCOUNTER — Other Ambulatory Visit: Payer: Self-pay | Admitting: Cardiology

## 2016-12-08 ENCOUNTER — Other Ambulatory Visit: Payer: Self-pay

## 2016-12-08 MED ORDER — ATORVASTATIN CALCIUM 80 MG PO TABS: 80.0000 mg | ORAL_TABLET | Freq: Every day | ORAL | 3 refills | Status: DC

## 2016-12-16 ENCOUNTER — Observation Stay (HOSPITAL_COMMUNITY)
Admission: EM | Admit: 2016-12-16 | Discharge: 2016-12-17 | Disposition: A | Discharge status: Home / Self Care | Payer: Medicare HMO | Attending: Internal Medicine | Admitting: Internal Medicine

## 2016-12-16 ENCOUNTER — Emergency Department (HOSPITAL_COMMUNITY): Payer: Medicare HMO

## 2016-12-16 ENCOUNTER — Encounter (HOSPITAL_COMMUNITY): Payer: Self-pay

## 2016-12-16 DIAGNOSIS — I441 Atrioventricular block, second degree: Secondary | ICD-10-CM | POA: Diagnosis not present

## 2016-12-16 DIAGNOSIS — Z5181 Encounter for therapeutic drug level monitoring: Secondary | ICD-10-CM | POA: Insufficient documentation

## 2016-12-16 DIAGNOSIS — I440 Atrioventricular block, first degree: Secondary | ICD-10-CM

## 2016-12-16 DIAGNOSIS — E784 Other hyperlipidemia: Secondary | ICD-10-CM | POA: Diagnosis not present

## 2016-12-16 DIAGNOSIS — R42 Dizziness and giddiness: Secondary | ICD-10-CM | POA: Diagnosis not present

## 2016-12-16 DIAGNOSIS — Z87891 Personal history of nicotine dependence: Secondary | ICD-10-CM | POA: Insufficient documentation

## 2016-12-16 DIAGNOSIS — Z7901 Long term (current) use of anticoagulants: Secondary | ICD-10-CM

## 2016-12-16 DIAGNOSIS — R001 Bradycardia, unspecified: Secondary | ICD-10-CM | POA: Diagnosis not present

## 2016-12-16 DIAGNOSIS — E785 Hyperlipidemia, unspecified: Secondary | ICD-10-CM | POA: Diagnosis present

## 2016-12-16 DIAGNOSIS — I2510 Atherosclerotic heart disease of native coronary artery without angina pectoris: Secondary | ICD-10-CM | POA: Diagnosis not present

## 2016-12-16 DIAGNOSIS — I25708 Atherosclerosis of coronary artery bypass graft(s), unspecified, with other forms of angina pectoris: Secondary | ICD-10-CM

## 2016-12-16 DIAGNOSIS — R531 Weakness: Secondary | ICD-10-CM | POA: Diagnosis not present

## 2016-12-16 DIAGNOSIS — I4892 Unspecified atrial flutter: Secondary | ICD-10-CM

## 2016-12-16 HISTORY — DX: Atrioventricular block, second degree: I44.1

## 2016-12-16 LAB — CBC WITH DIFFERENTIAL/PLATELET
Basophils Absolute: 0 10*3/uL (ref 0.0–0.1)
Basophils Relative: 0 %
Eosinophils Absolute: 0.1 10*3/uL (ref 0.0–0.7)
Eosinophils Relative: 3 %
HEMATOCRIT: 42.6 % (ref 39.0–52.0)
HEMOGLOBIN: 13.6 g/dL (ref 13.0–17.0)
LYMPHS ABS: 1.4 10*3/uL (ref 0.7–4.0)
Lymphocytes Relative: 31 %
MCH: 30.6 pg (ref 26.0–34.0)
MCHC: 31.9 g/dL (ref 30.0–36.0)
MCV: 95.7 fL (ref 78.0–100.0)
MONOS PCT: 7 %
Monocytes Absolute: 0.3 10*3/uL (ref 0.1–1.0)
NEUTROS PCT: 59 %
Neutro Abs: 2.6 10*3/uL (ref 1.7–7.7)
Platelets: 184 10*3/uL (ref 150–400)
RBC: 4.45 MIL/uL (ref 4.22–5.81)
RDW: 15.1 % (ref 11.5–15.5)
WBC: 4.5 10*3/uL (ref 4.0–10.5)

## 2016-12-16 LAB — I-STAT CHEM 8, ED
BUN: 17 mg/dL (ref 6–20)
CALCIUM ION: 1.17 mmol/L (ref 1.15–1.40)
Chloride: 108 mmol/L (ref 101–111)
Creatinine, Ser: 0.6 mg/dL — ABNORMAL LOW (ref 0.61–1.24)
Glucose, Bld: 96 mg/dL (ref 65–99)
HCT: 40 % (ref 39.0–52.0)
Hemoglobin: 13.6 g/dL (ref 13.0–17.0)
Potassium: 4 mmol/L (ref 3.5–5.1)
Sodium: 142 mmol/L (ref 135–145)
TCO2: 27 mmol/L (ref 0–100)

## 2016-12-16 LAB — PROTIME-INR
INR: 2.49
Prothrombin Time: 27.4 seconds — ABNORMAL HIGH (ref 11.4–15.2)

## 2016-12-16 LAB — I-STAT TROPONIN, ED: Troponin i, poc: 0 ng/mL (ref 0.00–0.08)

## 2016-12-16 MED ORDER — TAMSULOSIN HCL 0.4 MG PO CAPS
0.4000 mg | ORAL_CAPSULE | Freq: Every day | ORAL | Status: DC
  Administered 2016-12-16: 0.4 mg via ORAL
  Filled 2016-12-16: qty 1

## 2016-12-16 MED ORDER — WARFARIN - PHARMACIST DOSING INPATIENT: Status: DC

## 2016-12-16 MED ORDER — ATORVASTATIN CALCIUM 40 MG PO TABS
80.0000 mg | ORAL_TABLET | Freq: Every day | ORAL | Status: DC
  Administered 2016-12-16: 80 mg via ORAL
  Filled 2016-12-16: qty 2

## 2016-12-16 MED ORDER — WARFARIN SODIUM 7.5 MG PO TABS
7.5000 mg | ORAL_TABLET | Freq: Once | ORAL | Status: AC
  Administered 2016-12-16: 7.5 mg via ORAL
  Filled 2016-12-16: qty 3

## 2016-12-16 MED ORDER — ACETAMINOPHEN 650 MG RE SUPP: 650.0000 mg | Freq: Four times a day (QID) | RECTAL | Status: DC | PRN

## 2016-12-16 MED ORDER — ACETAMINOPHEN 325 MG PO TABS: 650.0000 mg | ORAL_TABLET | Freq: Four times a day (QID) | ORAL | Status: DC | PRN

## 2016-12-16 NOTE — Consult Note (Signed)
The patient was seen and examined, and I agree with the history, physical exam, assessment and plan as by K. Lawrence NP, with modifications as noted below. I have also personally reviewed all relevant documentation, old records, labs, and both radiographic and cardiovascular studies. I have also independently interpreted old and new ECG's.  83 yr old male (who appears much younger than stated age) with h/o MI and CABG, paroxysmal atrial flutter, and Mobitz 1 AV block presented with dizziness, which has been going on for past one week, moreso in past few days. Was at [facility] for tooth extraction and felt dizzy a couple of weeks ago and was told BP was low. Denies chest pain, shortness of breath, palpitations, and syncope.  He walks 1200 miles per year and mows 2.5 acres once a week.  He is on metoprolol 12.5 mg bid.  ECG performed at 10:48 am which I personally interpreted shows sinus bradycardia, 53 bpm, with <2 sec pause. Previous ECG which I personally interpreted showed sinus bradycardia with intermittent nonconducted P waves.  Telemetry during my evaluation showed sinus bradycardia in mid 50 bpm range with occasional Wenckebach, nonconducted PAC's, and short pauses.  Would recommend monitoring with metoprolol discontinuation for beta blocker washout. Would then recommend 30-day outpatient event monitor at time of discharge.  Kate Sable, MD, [facility]  12/16/2016 11:29 AM

## 2016-12-16 NOTE — Progress Notes (Signed)
Notified by central tele of multiple heart rhythms. MD notified. 12 lead EKG ordered. Will continue to monitor.

## 2016-12-16 NOTE — Progress Notes (Signed)
ANTICOAGULATION CONSULT NOTE - Initial Consult  Pharmacy Consult for Warfarin (home med) Indication: atrial fibrillation  No Known Allergies  Patient Measurements: Height: 5\' 8"  (172.7 cm) Weight: 179 lb 11.2 oz (81.5 kg) IBW/kg (Calculated) : 68.4  Vital Signs: Temp: 97.6 F (36.4 C) (04/12 1439) Temp Source: Oral (04/12 1439) BP: 137/62 (04/12 1439) Pulse Rate: 57 (04/12 1439)  Labs:  Recent Labs  12/16/16 1057 12/16/16 1103  HGB 13.6 13.6  HCT 42.6 40.0  PLT 184  --   LABPROT 27.4*  --   INR 2.49  --   CREATININE  --  0.60*    Estimated Creatinine Clearance: 70.1 mL/min (A) (by C-G formula based on SCr of 0.6 mg/dL (L)).   Medical History: Past Medical History:  Diagnosis Date  . Arteriosclerotic cardiovascular disease (ASCVD)    CABG surgery in 1990. MI in 1986; coronary angiography in 2003-critical LAD with patent LIMA; total obstruction of the RCA with patent RIMA; low normal EF.  [name] Atrial flutter, paroxysmal ([place])    asymptomatic; onset in 2010; 4:1 AVB with low-dose metoprolol; moderate left      atrial enlargement and mild left ventricular hypertrophy with normal ejection fraction by echocardiography  . Benign prostatic hypertrophy   . Chronic anticoagulation   . Hyperlipidemia    Lipid profile in 09/2010:196, 84, 54, 125  . Nephrolithiasis   . Polymyalgia rheumatica (HCC)    Medications:  Prescriptions Prior to Admission  Medication Sig Dispense Refill Last Dose  . acetaminophen (TYLENOL EX ST ARTHRITIS PAIN) 500 MG tablet Take 500 mg by mouth as needed for moderate pain.    unknown  . atorvastatin (LIPITOR) 80 MG tablet Take 1 tablet (80 mg total) by mouth daily. (Patient taking differently: Take 80 mg by mouth daily at 6 PM. ) 90 tablet 3 12/15/2016 at Unknown time  . metoprolol tartrate (LOPRESSOR) 25 MG tablet TAKE ONE-HALF TABLET BY MOUTH TWICE DAILY 30 tablet 0 12/15/2016 at 1730  . Tamsulosin HCl (FLOMAX) 0.4 MG CAPS Take 0.4 mg by mouth daily.      12/15/2016 at Unknown time  . warfarin (COUMADIN) 5 MG tablet TAKE ONE & ONE-HALF TABLETS BY MOUTH ONCE DAILY OR AS DIRECTED BY CLINIC 45 tablet 6 12/15/2016 at 1730   Assessment: Okay for Protocol, no bleeding noted.  Last dose 4/11 per home med list.  Goal of Therapy:  INR 2-3   Plan:  Warfarin 7.5mg  PO x 1 Daily PT/INR Monitor for signs and symptoms of bleeding.  Pricilla Larsson 12/16/2016,5:33 PM

## 2016-12-16 NOTE — H&P (Signed)
History and Physical  Shane [name] XKG:[id number] DOB: 1933/01/06 DOA: 12/16/2016  PCP: Asencion Noble, MD  Patient coming from: home  Chief Complaint: dizzy  HPI:  83 year old man PMH coronary artery disease/CABG presented to the emergency department with dizziness. Evaluation revealed marked bradycardia and he was admitted for further evaluation of the same.  Over the last 2-3 weeks patient said several short episodes of lightheadedness and dizziness which have been brief in nature and self resolved. He's had no syncope or loss of balance. No weakness or numbness. He had much worse episode today of lightheadedness and dizziness, went to see his PCP but ended up going to the emergency department where he was found to have marked sinus bradycardia with pauses.  No specific aggravating or alleviating factors. The episode today was severe. No chest pain or other associated symptoms.  ED Course: Afebrile, bradycardic but vital signs otherwise stable. Seen by cardiology with recommendation for telemetry overnight. Pertinent labs: CBC, BMP unremarkable. Imaging: CXR independently reviewed: No acute disease   Review of Systems:  Negative for fever, visual changes, sore throat, rash, new muscle aches, chest pain, SOB, dysuria, bleeding, n/v/abdominal pain.  Past Medical History:  Diagnosis Date  . Arteriosclerotic cardiovascular disease (ASCVD)    CABG surgery in 1990. MI in 1986; coronary angiography in 2003-critical LAD with patent LIMA; total obstruction of the RCA with patent RIMA; low normal EF.  [name] Atrial flutter, paroxysmal ([place])    asymptomatic; onset in 2010; 4:1 AVB with low-dose metoprolol; moderate left      atrial enlargement and mild left ventricular hypertrophy with normal ejection fraction by echocardiography  . Benign prostatic hypertrophy   . Chronic anticoagulation   . Hyperlipidemia    Lipid profile in 09/2010:196, 84, 54, 125  . Nephrolithiasis   . Polymyalgia rheumatica  (HCC)     Past Surgical History:  Procedure Laterality Date  . COLONOSCOPY  08/21/01   RMR: Left-sided diverticulum.  Remainder of colonic mucosa appeared normal  . COLONOSCOPY N/A 07/31/2013   Procedure: COLONOSCOPY;  Surgeon: Danie Binder, MD;  Location: [facility];  Service: Endoscopy;  Laterality: N/A;  10:30  . CORONARY ARTERY BYPASS GRAFT  1990  . LUMBAR DISC SURGERY       reports that he quit smoking about 34 years ago. He has a 37.50 pack-year smoking history. He has never used smokeless tobacco. He reports that he drinks alcohol. He reports that he does not use drugs. Mobility: Walks 4 miles per day  No Known Allergies  Family History  Problem Relation Age of Onset  . Heart attack Mother   . Coronary artery disease Brother     CABG + pacemaker  . Pneumonia Brother     infant death  . Colon cancer Neg Hx   . Colon polyps Neg Hx      Prior to Admission medications   Medication Sig Start Date End Date Taking? Authorizing Provider  acetaminophen (TYLENOL EX ST ARTHRITIS PAIN) 500 MG tablet Take 500 mg by mouth as needed for moderate pain.    Yes Historical Provider, MD  atorvastatin (LIPITOR) 80 MG tablet Take 1 tablet (80 mg total) by mouth daily. Patient taking differently: Take 80 mg by mouth daily at 6 PM.  12/08/16  Yes Arnoldo Lenis, MD  metoprolol tartrate (LOPRESSOR) 25 MG tablet TAKE ONE-HALF TABLET BY MOUTH TWICE DAILY 11/24/16  Yes Arnoldo Lenis, MD  Tamsulosin HCl (FLOMAX) 0.4 MG CAPS Take 0.4 mg by mouth daily.  Yes Historical Provider, MD  warfarin (COUMADIN) 5 MG tablet TAKE ONE & ONE-HALF TABLETS BY MOUTH ONCE DAILY OR AS DIRECTED BY CLINIC 10/04/16  Yes Arnoldo Lenis, MD    Physical Exam: Vitals:   12/16/16 1300 12/16/16 1330 12/16/16 1400 12/16/16 1439  BP: (!) 150/76 (!) 142/62 137/67 137/62  Pulse: (!) 52 (!) 50 60 (!) 57  Resp: 18 15 15 18   Temp:    97.6 F (36.4 C)  TempSrc:    Oral  SpO2: 99% 94% 93% 96%  Weight:    81.5 kg  (179 lb 11.2 oz)  Height:    5\' 8"  (1.727 m)    Constitutional:  . Appears calm and comfortable Eyes:  . Pupils, irises, lips appear unremarkable. . ENT: Mild hearing loss, Lips and tongue appear unremarkable. a Neck:  . No lymphadenopathy or masses. No thyromegaly. Respiratory:  . CTA bilaterally, no w/r/r.  . Respiratory effort normal. No retractions or accessory muscle use Cardiovascular:  . RRR, no m/r/g . No LE extremity edema   Abdomen:  . Abdomen appears normal; no tenderness or masses . Small umbilical hernia, nontender Musculoskeletal:  . Digits/nails hands : no clubbing, cyanosis, petechiae, infection . RUE, LUE, RLE, LLE   o strength and tone normal, no atrophy, no abnormal movements o No tenderness, masses Skin:  . No rashes, lesions, ulcers . palpation of skin: no induration or nodules Psychiatric:  . judgement and insight appear normal . Mental status o Mood, affect appropriate  Wt Readings from Last 3 Encounters:  12/16/16 81.5 kg (179 lb 11.2 oz)  06/18/15 83.6 kg (184 lb 6.4 oz)  05/16/14 81.6 kg (180 lb)    I have personally reviewed following labs and imaging studies  Labs on Admission:  CBC:  Recent Labs Lab 12/16/16 1057 12/16/16 1103  WBC 4.5  --   NEUTROABS 2.6  --   HGB 13.6 13.6  HCT 42.6 40.0  MCV 95.7  --   PLT 184  --    Basic Metabolic Panel:  Recent Labs Lab 12/16/16 1103  NA 142  K 4.0  CL 108  GLUCOSE 96  BUN 17  CREATININE 0.60*     Recent Labs Lab 12/16/16 1057  INR 2.49    Radiological Exams on Admission: Dg Chest Portable 1 View  Result Date: 12/16/2016 CLINICAL DATA:  Weakness, dizziness EXAM: PORTABLE CHEST 1 VIEW COMPARISON:  Five/ 28/0 6 FINDINGS: Cardiomediastinal silhouette is stable. Status post CABG. Elevation of the right hemidiaphragm again noted. No infiltrate or pleural effusion. No pulmonary edema. IMPRESSION: No active disease. Status post CABG. Stable chronic elevation of the right  hemidiaphragm. Electronically Signed   By: Lahoma Crocker M.D.   On: 12/16/2016 13:21    EKG: Independently reviewed:work sinus bradycardia, pauses. Second EKG sinus bradycardia, sinus pause first-degree AV block. Third EKG marked sinus bradycardia with sinus pause.   Principal Problem:   Symptomatic bradycardia Active Problems:   Arteriosclerotic cardiovascular disease (ASCVD)   Atrial flutter, paroxysmal (HCC)   Assessment/Plan  1. Symptomatic marked sinus bradycardia. Currently asymptomatic in the back. Suspect bradycardia as underlying cause of episodes of dizziness. Appreciate cardiology evaluation and recommendations.  Stop metoprolol. Continue telemetry. Follow clinically.   Repeat EKG in a.m. 2. Coronary artery disease, asymptomatic. 3. Paroxysmal atrial flutter. Continue warfarin. Start metoprolol as above.   It is my clinical opinion that referral for OBSERVATION is reasonable and necessary in this patient  The aforementioned taken together are felt to place  the patient at high risk for further clinical deterioration. However it is anticipated that the patient may be medically stable for discharge from the hospital within 24 to 48 hours.   DVT prophylaxis: warfarin Code Status: full Family Communication: wife and 2 granddaughters    Time spent: 4 minutes  Murray Hodgkins, MD  [facility] Direct contact: [phone] --[address]  --[email or web address]; password [email or web address]  7PM-7AM contact night coverage as above  12/16/2016, 6:07 PM

## 2016-12-16 NOTE — Consult Note (Addendum)
CARDIOLOGY CONSULT NOTE   Patient ID: Shane [name] MRN: [id number] DOB/AGE: [birth year] 83 y.o.  Admit Date: 12/16/2016 Referring Physician: [place] Primary Physician: Asencion Noble, MD Consulting Cardiologist: [facility] Primary Cardiologist Branch Reason for Consultation: Dizziness and bradycardia   Clinical Summary Shane [name] is a 83 y.o. male who is being seen today for the evaluation of dizziness,  at the request of dr Dewayne Hatch, and Dr. Willey Blade. He has a history of CAD, s\p CABG in 1990, PTCA of LAD, in 2003, with CTO of RCA with patent RIMA and LIMA, atrial flutter On Coumadin, followed in our office, and hyperlipidemia.   Shane [name] actually has an appointment with Dr. Harl Bowie this afternoon, but has been having episodes of dizziness over the last 3 weeks. [facility] done approximately 2-3 weeks ago, where his blood pressure was taken. He was told it was very low. He states that that made him wonder if his dizziness was related to his low blood pressure. The patient continued to have episodes of dizziness each day lasting seconds to several minutes not associated with activity, position change, or being at rest. The patient has been medically compliant, taking his Coumadin,  coming to appointments, along with his follow-up appointments with primary care.  The patient is quite active, walking 4 miles a day, every day as a week, stating that he logs 1200 miles a year. 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 He states that the dizziness has become more worrisome to him, and he came to our office earlier this morning to see if he could be seen sooner by Dr. Harl Bowie due to his symptoms. At the time there were no openings, and he was directed to the emergency room. The patient was unable to get to the emergency room on his own accord due to dizziness and sat down in the waiting area outside of cardiac  rehabilitation. Cardiac rehabilitation nurse Geanie Cooley RN, was asked by the patient's wife to help him because of his dizziness. She put him in a wheelchair and got into the ER.  On arrival to the ER heart rate was 45 bpm, blood pressure 161/73, he was afebrile. EKG revealed sinus bradycardia heart rate 53 bpm with pause.   No Known Allergies  Medications Scheduled Medications:    Infusions:    PRN Medications:     Past Medical History:  Diagnosis Date  . Arteriosclerotic cardiovascular disease (ASCVD)    CABG surgery in 1990. MI in 1986; coronary angiography in 2003-critical LAD with patent LIMA; total obstruction of the RCA with patent RIMA; low normal EF.  [name] Atrial flutter, paroxysmal ([place])    asymptomatic; onset in 2010; 4:1 AVB with low-dose metoprolol; moderate left      atrial enlargement and mild left ventricular hypertrophy with normal ejection fraction by echocardiography  . Benign prostatic hypertrophy   . Chronic anticoagulation   . Hyperlipidemia    Lipid profile in 09/2010:196, 84, 54, 125  . Nephrolithiasis   . Polymyalgia rheumatica (HCC)     Past Surgical History:  Procedure Laterality Date  . COLONOSCOPY  08/21/01   RMR: Left-sided diverticulum.  Remainder of colonic mucosa appeared normal  . COLONOSCOPY N/A 07/31/2013   Procedure: COLONOSCOPY;  Surgeon: Danie Binder, MD;  Location: [facility];  Service: Endoscopy;  Laterality: N/A;  10:30  . CORONARY ARTERY BYPASS  GRAFT  1990  . LUMBAR DISC SURGERY      Family History  Problem Relation Age of Onset  . Heart attack Mother   . Coronary artery disease Brother     CABG + pacemaker  . Pneumonia Brother     infant death  . Colon cancer Neg Hx   . Colon polyps Neg Hx     Social History Shane [name] reports that he quit smoking about 34 years ago. He has a 37.50 pack-year smoking history. He has never used smokeless tobacco. Shane [name] reports that he drinks alcohol.  Review of  Systems Complete review of systems are found to be negative unless outlined in H&P above.  Physical Examination Blood pressure 133/73, pulse (!) 50, temperature 97.4 F (36.3 C), temperature source Oral, resp. rate 14, height 5\' 8"  (1.727 m), weight 174 lb (78.9 kg), SpO2 94 %. No intake or output data in the 24 hours ending 12/16/16 1134  Telemetry: Sinus bradycardia, heart rate 46-52 on telemetry in ER, with occasional pause.   GEN:No acute distress, looking much younger than stated age  85: Conjunctiva and lids normal, oropharynx clear with moist mucosa. Neck: Supple, no elevated JVP or carotid bruits, no thyromegaly. Lungs: Clear to auscultation, nonlabored breathing at rest. Cardiac: Regular rate and rhythm, bradycardic  no S3 or significant systolic murmur, no pericardial rub. Abdomen: Soft, nontender, no hepatomegaly, bowel sounds present, no guarding or rebound. Extremities: No pitting edema, distal pulses 2+. Skin: Warm and dry. Musculoskeletal: No kyphosis. Neuropsychiatric: Alert and oriented x3, affect grossly appropriate.  Prior Cardiac Testing/Procedures NM Stress Test  02/02/2005 RESULTS: There is a small fixed defect in the inferior wall consistent with a scar. There is no evidence of significant ischemia. The overall ejection fraction is 54%. There is inferior septal akinesis.  STRESS TEST: The patient exercised 10 minutes and 34 seconds of a Bruce protocol, attaining 12.8 mets of exercise. His heart rate increased from 71 beats per minute to 155 beats per minute. His blood pressure from 132/8 to 188/78, giving a double product of 29.5 thousand. During that time, he experienced no symptoms, no chest discomfort. The reason for stopping the test was fatigue and attaining 103% of his max predicted heart rate for his age. There was no ischemic ST-T wave changes and no arrhythmia was seen.  IMPRESSION:  This is an abnormal perfusion scan in a patient with known coronary artery  disease. There is distinct fixed defect consistent with an old myocardial infarction; however, without any significant ischemia. Excellent exercise tolerance with appropriate blood pressure response and no ischemic ST-T wave changes. This is a low risk scan.  Echocardiogram 09/10/2008 Overall left ventricular systolic function was normal. Left    ventricular ejection fraction was estimated , range being 55    % to 60 %. There were no left ventricular regional wall    motion abnormalities. Left ventricular wall thickness was    mildly increased. There was moderate basal septal    hypertrophy. - The aortic valve was mildly calcified. - There was mild fibrocalcific change of the aortic root. - The effective orifice of mitral regurgitation by proximal    isovelocity surface area was 0.13 cm^2. The volume of mitral    regurgitation by proximal isovelocity surface area was 19 cc. - The left atrium was moderately dilated. - There was mild right ventricular hypertrophy. - The right atrium was mild to moderately dilated.  Cardiac Cath 01/18/2011 FINDINGS:  1. Left main trunk.  Medium caliber vessel with mild ________.  2. LAD.  This is a medium caliber vessel which supplies a trivial first     diagonal branch proximal segment, medium caliber second diagonal branch     thereafter.  The LAD then extends to the apex.  The LAD has moderate     disease, 50% of the proximal left segment, which then extends into a     narrowing of 70% encompassing the trivial diagonal branch.  The distal     LAD has mild irregularities and is seen to fill predominantly via the     LIMA graft.  There is mid narrowing of 30%.  The second diagonal branch     has an ostial narrowing of 50% and fills predominantly via antegrade     flow.  3. Left circumflex artery.  This is a medium caliber vessel that supplies a     small first marginal branch and the proximal segment a larger  second     marginal branch.  In the mid-section, there is moderate narrowing of 30-     40% of the proximal segment of the second marginal branch.  4. Right coronary artery.  This was a dominant medium caliber vessel that     supplies the posterior descending artery and a posterior ventricular     branch in its terminal segment. The right coronary artery is 100%     occluded in the mid-section.  The distal vessel fills the in situ RIMA     graft anastomosed to the distal right coronary artery.  The posterior     descending artery and the posterior ventricular branch have mild _______     of 30%.  5. RIMA to the distal right coronary artery is patent.  This was an in situ     graft.  6. LIMA to the LAD is patent.  This was also an in situ graft.  7. Left ventricle.  Normal end-systolic and end-diastolic dimensions.     Normal left ventricular function is well preserved.  Ejection fraction 50-     55%.  No mitral regurgitation.  LV pressure is 120/5.  Aortic is 120/65.     LVEDP is 15.  ASSESSMENT AND PLAN:  The patient is a 83 year old gentleman with two-vessel  coronary artery disease that is well revascularized surgically.  He has well-  preserved LV function.  The only concern is the second diagonal branch which  is compromised by moderate disease in the proximal and mid-LAD.  This does  not appear to be critical in nature; however, this disease is presumptively  the reason the patient underwent bypass surgery.  In addition, the amount of  myocardium supplied by this diagonal branch is relatively small and is  unlikely to elicit the symptoms the patient presented with.  We will thus  pursue a conservative course of medical therapy should the patient have  recurrent symptoms or an abnormal stress imaging study.  Percutaneous  intervention may be considered to improve flow to the second diagonal  branch.   Lab Results  Basic Metabolic Panel:  Recent Labs Lab 12/16/16 1103  NA  142  K 4.0  CL 108  GLUCOSE 96  BUN 17  CREATININE 0.60*     CBC:  Recent Labs Lab 12/16/16 1057 12/16/16 1103  WBC 4.5  --   NEUTROABS 2.6  --   HGB 13.6 13.6  HCT 42.6 40.0  MCV 95.7  --   PLT 184  --  Cardiac Enzymes: No results for input(s): CKTOTAL, CKMB, CKMBINDEX, TROPONINI in the last 168 hours.  BNP: Invalid input(s): POCBNP   Radiology: No results found.   ECG:  Sinus bradycardia, heart rate of 52 bpm, pause, prolonged PR interval, 0.28-0.30 I caliper measurement.   Impression and Recommendations  1. Sinus bradycardia: Likely cause of dizziness. Patient on review of medications at home is on metoprolol 12.5 mg twice a day. This will be discontinued, and wait for "washout", with recommendations to admit to telemetry for observation and evaluation of heart rate. He will likely need a cardiac monitor on discharge for further evaluation of heart rate unless prominent bradycardia or pauses are noted during hospitalization.  2. Coronary artery disease: History of coronary artery bypass grafting in 1990, LIMA to LAD, and RIMA to RCA, with CTO of the RCA. He had subsequent PTCA of the LAD, with patent LIMA to LAD per cath in 2012. He is medically compliant, follows regularly with his primary care for lipids and LFTs along with medication adjustments. Continue aspirin therapy. Holding beta blocker as documented above. No complaints of angina or dyspnea associated with dizziness. No plans for invasive testing at this time.  3. History of paroxysmal atrial flutter: Continues on Coumadin therapy, and is followed in our [place] office for INR and dosing. INR today 2.49. No plans to stop this medication, unless emergent need for pacemaker. Currently this is not required.    4. Hypercholesterolemia: Currently on statin therapy. Most recent documented lipid panel was on 12/25/2011: Cholesterol 124, HDL 49, LDL 67. Continue atorvastatin 80 mg daily.  4. BPH: Currently on  Flomax. Defer to PCP.     Signed: Phill Myron. Riyansh Gerstner NP [place]  12/16/2016, 11:34 AM Co-Sign MD  The patient was seen and examined, and I agree with the history, physical exam, assessment and plan as by K. Mahitha Hickling NP, with modifications as noted below. I have also personally reviewed all relevant documentation, old records, labs, and both radiographic and cardiovascular studies. I have also independently interpreted old and new ECG's.  83 yr old male (who appears much younger than stated age) with h/o MI and CABG, paroxysmal atrial flutter, and Mobitz 1 AV block presented with dizziness, which has been going on for past one week, moreso in past few days. Was at [facility] for tooth extraction and felt dizzy a couple of weeks ago and was told BP was low. Denies chest pain, shortness of breath, palpitations, and syncope.  He walks 1200 miles per year and mows 2.5 acres once a week.  He is on metoprolol 12.5 mg bid.  ECG performed at 10:48 am which I personally interpreted shows sinus bradycardia, 53 bpm, with <2 sec pause. Previous ECG which I personally interpreted showed sinus bradycardia with intermittent nonconducted P waves.  Telemetry during my evaluation showed sinus bradycardia in mid 50 bpm range with occasional Wenckebach, nonconducted PAC's, and short pauses.  Would recommend monitoring with metoprolol discontinuation for beta blocker washout. Would then recommend 30-day outpatient event monitor at time of discharge.

## 2016-12-16 NOTE — ED Provider Notes (Signed)
[facility] DEPT Provider Note   CSN: [id number] Arrival date & time: 12/16/16  8588  By signing my name below, I, Higinio Plan, attest that this documentation has been prepared under the direction and in the presence of Milton Ferguson, MD . Electronically Signed: Higinio Plan, Scribe. 12/16/2016. 10:31 AM.  History   Chief Complaint Chief Complaint  Patient presents with  . Dizziness   The history is provided by the patient. No language interpreter was used.  Dizziness  Quality:  Lightheadedness Severity:  Mild Duration:  2 weeks Timing:  Intermittent Progression:  Worsening Chronicity:  New Associated symptoms: no chest pain, no diarrhea and no headaches   Risk factors: heart disease    HPI Comments: KWABENA STRUTZ is a 83 y.o. male with PMHx of CAD, HLD, and nephrolithiasis, who presents to the Emergency Department complaining of intermittent, dizziness described as lightheadedness that began ~2 weeks ago. Pt reports his symptoms worsened this morning which is why he visited the ED. He denies any other associated or exacerbating symptoms.    Past Medical History:  Diagnosis Date  . Arteriosclerotic cardiovascular disease (ASCVD)    CABG surgery in 1990. MI in 1986; coronary angiography in 2003-critical LAD with patent LIMA; total obstruction of the RCA with patent RIMA; low normal EF.  [name] Atrial flutter, paroxysmal ([place])    asymptomatic; onset in 2010; 4:1 AVB with low-dose metoprolol; moderate left      atrial enlargement and mild left ventricular hypertrophy with normal ejection fraction by echocardiography  . Benign prostatic hypertrophy   . Chronic anticoagulation   . Hyperlipidemia    Lipid profile in 09/2010:196, 84, 54, 125  . Nephrolithiasis   . Polymyalgia rheumatica [facility])     Patient Active Problem List   Diagnosis Date Noted  . Encounter for therapeutic drug monitoring 10/03/2013  . Colon cancer screening 07/12/2013  . Borderline hypertension 10/07/2011  .  Arteriosclerotic cardiovascular disease (ASCVD)   . Hyperlipidemia   . Chronic anticoagulation   . Atrial flutter, paroxysmal ([place])   . Nephrolithiasis 03/26/2009  . Polymyalgia rheumatica ([place]) 03/26/2009  . BENIGN PROSTATIC HYPERTROPHY, HX OF 03/26/2009    Past Surgical History:  Procedure Laterality Date  . COLONOSCOPY  08/21/01   RMR: Left-sided diverticulum.  Remainder of colonic mucosa appeared normal  . COLONOSCOPY N/A 07/31/2013   Procedure: COLONOSCOPY;  Surgeon: Danie Binder, MD;  Location: [facility];  Service: Endoscopy;  Laterality: N/A;  10:30  . CORONARY ARTERY BYPASS GRAFT  1990  . LUMBAR DISC SURGERY      Home Medications    Prior to Admission medications   Medication Sig Start Date End Date Taking? Authorizing Provider  acetaminophen (TYLENOL EX ST ARTHRITIS PAIN) 500 MG tablet Take 500 mg by mouth as needed.      Historical Provider, MD  atorvastatin (LIPITOR) 80 MG tablet Take 1 tablet (80 mg total) by mouth daily. 12/08/16   Arnoldo Lenis, MD  metoprolol tartrate (LOPRESSOR) 25 MG tablet TAKE ONE-HALF TABLET BY MOUTH TWICE DAILY 11/24/16   Arnoldo Lenis, MD  Tamsulosin HCl (FLOMAX) 0.4 MG CAPS Take 0.4 mg by mouth daily.      Historical Provider, MD  warfarin (COUMADIN) 5 MG tablet TAKE ONE & ONE-HALF TABLETS BY MOUTH ONCE DAILY OR AS DIRECTED BY CLINIC 10/04/16   Arnoldo Lenis, MD    Family History Family History  Problem Relation Age of Onset  . Heart attack Mother   . Coronary artery disease  Brother     CABG + pacemaker  . Pneumonia Brother     infant death  . Colon cancer Neg Hx   . Colon polyps Neg Hx     Social History Social History  Substance Use Topics  . Smoking status: Former Smoker    Packs/day: 1.50    Years: 25.00    Quit date: 10/07/1982  . Smokeless tobacco: Never Used  . Alcohol use Yes     Comment: 1 bottle of wine weekly; occasional beer   Allergies   Patient has no known allergies.  Review of Systems Review  of Systems  Constitutional: Negative for appetite change and fatigue.  HENT: Negative for congestion, ear discharge and sinus pressure.   Eyes: Negative for discharge.  Respiratory: Negative for cough.   Cardiovascular: Negative for chest pain.  Gastrointestinal: Negative for abdominal pain and diarrhea.  Genitourinary: Negative for frequency and hematuria.  Musculoskeletal: Negative for back pain.  Skin: Negative for rash.  Neurological: Positive for dizziness and light-headedness. Negative for seizures and headaches.  Psychiatric/Behavioral: Negative for hallucinations.   Physical Exam Updated Vital Signs BP (!) 161/73   Pulse (!) 45   Temp 97.4 F (36.3 C) (Oral)   Resp 16   Ht 5\' 8"  (1.727 m)   Wt 174 lb (78.9 kg)   SpO2 98%   BMI 26.46 kg/m   Physical Exam  Constitutional: He is oriented to person, place, and time. He appears well-developed.  HENT:  Head: Normocephalic.  Eyes: Conjunctivae and EOM are normal. No scleral icterus.  Neck: Neck supple. No thyromegaly present.  Cardiovascular: Normal rate and regular rhythm.  Exam reveals no gallop and no friction rub.   No murmur heard. Pulmonary/Chest: No stridor. He has no wheezes. He has no rales. He exhibits no tenderness.  Abdominal: He exhibits no distension. There is no tenderness. There is no rebound.  Musculoskeletal: Normal range of motion. He exhibits no edema.  Lymphadenopathy:    He has no cervical adenopathy.  Neurological: He is oriented to person, place, and time. He exhibits normal muscle tone. Coordination normal.  Skin: No rash noted. No erythema.  Psychiatric: He has a normal mood and affect. His behavior is normal.   ED Treatments / Results  DIAGNOSTIC STUDIES:  Oxygen Saturation is 98% on RA, normal by my interpretation.    COORDINATION OF CARE:  10:28 AM Discussed treatment plan with pt at bedside and pt agreed to plan.  Labs (all labs ordered are listed, but only abnormal results are  displayed) Labs Reviewed - No data to display  EKG  EKG Interpretation None       Radiology No results found.  Procedures Procedures (including critical care time)  Medications Ordered in ED Medications - No data to display  Initial Impression / Assessment and Plan / ED Course  I have reviewed the triage vital signs and the nursing notes.  Pertinent labs & imaging results that were available during my care of the patient were reviewed by me and considered in my medical decision making (see chart for details).     Patient complaining of dizziness. EKG and rhythm strip shows patient is having occasional nonconducted P waves with 2 second pauses. Patient was seen by cardiology and they recommended stopping the beta blocker and moderate hurting him in the hospital  Final Clinical Impressions(s) / ED Diagnoses   Final diagnoses:  None    New Prescriptions New Prescriptions   No medications on file  Milton Ferguson, MD 12/16/16 1251

## 2016-12-16 NOTE — ED Triage Notes (Signed)
Pt reports that he was told his BP was low a couple of weeks ago. Pt says he has been dizzy off and on for 2 weeks, but has been dizzy since he woke up. Described as lightheaded

## 2016-12-17 ENCOUNTER — Encounter (HOSPITAL_COMMUNITY): Payer: Self-pay | Admitting: Physician Assistant

## 2016-12-17 ENCOUNTER — Other Ambulatory Visit: Payer: Self-pay | Admitting: Physician Assistant

## 2016-12-17 DIAGNOSIS — R001 Bradycardia, unspecified: Secondary | ICD-10-CM

## 2016-12-17 DIAGNOSIS — E784 Other hyperlipidemia: Secondary | ICD-10-CM | POA: Diagnosis not present

## 2016-12-17 DIAGNOSIS — I25708 Atherosclerosis of coronary artery bypass graft(s), unspecified, with other forms of angina pectoris: Secondary | ICD-10-CM | POA: Diagnosis not present

## 2016-12-17 DIAGNOSIS — R42 Dizziness and giddiness: Secondary | ICD-10-CM | POA: Diagnosis not present

## 2016-12-17 DIAGNOSIS — I440 Atrioventricular block, first degree: Secondary | ICD-10-CM

## 2016-12-17 DIAGNOSIS — I2510 Atherosclerotic heart disease of native coronary artery without angina pectoris: Secondary | ICD-10-CM | POA: Diagnosis not present

## 2016-12-17 DIAGNOSIS — Z7901 Long term (current) use of anticoagulants: Secondary | ICD-10-CM

## 2016-12-17 DIAGNOSIS — I4892 Unspecified atrial flutter: Secondary | ICD-10-CM | POA: Diagnosis not present

## 2016-12-17 DIAGNOSIS — I441 Atrioventricular block, second degree: Secondary | ICD-10-CM

## 2016-12-17 HISTORY — DX: Atrioventricular block, second degree: I44.1

## 2016-12-17 LAB — PROTIME-INR
INR: 2.43
PROTHROMBIN TIME: 26.9 s — AB (ref 11.4–15.2)

## 2016-12-17 LAB — TSH: TSH: 0.651 u[IU]/mL (ref 0.350–4.500)

## 2016-12-17 MED ORDER — WARFARIN SODIUM 7.5 MG PO TABS: 7.5000 mg | ORAL_TABLET | Freq: Once | ORAL | Status: DC

## 2016-12-17 NOTE — Progress Notes (Signed)
Spoke with our office re: 30 day monitor and follow-up. Jarrett Soho at the front desk is working on scheduling and says she will call up to the floor to let them know of appointments. Franciso Dierks PA-C

## 2016-12-17 NOTE — Progress Notes (Signed)
Subjective: He says he feels better. He's been up to the bathroom with no dizziness. He is however still having heart rates that have gone as low as into the upper 30s. He is asymptomatic. Heart rate now is 66. No chest pain. No syncope. No other new symptoms.  Objective: Vital signs in last 24 hours: Temp:  [97.4 F (36.3 C)-98.1 F (36.7 C)] 97.7 F (36.5 C) (04/13 0446) Pulse Rate:  [45-61] 58 (04/13 0446) Resp:  [14-21] 18 (04/13 0446) BP: (121-170)/(62-76) 128/69 (04/13 0446) SpO2:  [93 %-99 %] 95 % (04/13 0446) Weight:  [78.9 kg (174 lb)-81.5 kg (179 lb 11.2 oz)] 81.5 kg (179 lb 11.2 oz) (04/12 1439) Weight change:  Last BM Date: 12/16/16  Intake/Output from previous day: 04/12 0701 - 04/13 0700 In: 200 [P.O.:200] Out: -   PHYSICAL EXAM General appearance: alert, cooperative and no distress Resp: clear to auscultation bilaterally Cardio: regular rate and rhythm, S1, S2 normal, no murmur, click, rub or gallop GI: soft, non-tender; bowel sounds normal; no masses,  no organomegaly Extremities: extremities normal, atraumatic, no cyanosis or edema Skin warm and dry. Mucous membranes are moist pupils reactive  Lab Results:  Results for orders placed or performed during the hospital encounter of 12/16/16 (from the past 48 hour(s))  CBC with Differential/Platelet     Status: None   Collection Time: 12/16/16 10:57 AM  Result Value Ref Range   WBC 4.5 4.0 - 10.5 K/uL   RBC 4.45 4.22 - 5.81 MIL/uL   Hemoglobin 13.6 13.0 - 17.0 g/dL   HCT 42.6 39.0 - 52.0 %   MCV 95.7 78.0 - 100.0 fL   MCH 30.6 26.0 - 34.0 pg   MCHC 31.9 30.0 - 36.0 g/dL   RDW 15.1 11.5 - 15.5 %   Platelets 184 150 - 400 K/uL   Neutrophils Relative % 59 %   Neutro Abs 2.6 1.7 - 7.7 K/uL   Lymphocytes Relative 31 %   Lymphs Abs 1.4 0.7 - 4.0 K/uL   Monocytes Relative 7 %   Monocytes Absolute 0.3 0.1 - 1.0 K/uL   Eosinophils Relative 3 %   Eosinophils Absolute 0.1 0.0 - 0.7 K/uL   Basophils Relative 0 %    Basophils Absolute 0.0 0.0 - 0.1 K/uL  Protime-INR     Status: Abnormal   Collection Time: 12/16/16 10:57 AM  Result Value Ref Range   Prothrombin Time 27.4 (H) 11.4 - 15.2 seconds   INR 2.49   I-stat troponin, ED     Status: None   Collection Time: 12/16/16 11:02 AM  Result Value Ref Range   Troponin i, poc 0.00 0.00 - 0.08 ng/mL   Comment 3            Comment: Due to the release kinetics of cTnI, a negative result within the first hours of the onset of symptoms does not rule out myocardial infarction with certainty. If myocardial infarction is still suspected, repeat the test at appropriate intervals.   I-stat chem 8, ed     Status: Abnormal   Collection Time: 12/16/16 11:03 AM  Result Value Ref Range   Sodium 142 135 - 145 mmol/L   Potassium 4.0 3.5 - 5.1 mmol/L   Chloride 108 101 - 111 mmol/L   BUN 17 6 - 20 mg/dL   Creatinine, Ser 0.60 (L) 0.61 - 1.24 mg/dL   Glucose, Bld 96 65 - 99 mg/dL   Calcium, Ion 1.17 1.15 - 1.40 mmol/L   TCO2  27 0 - 100 mmol/L   Hemoglobin 13.6 13.0 - 17.0 g/dL   HCT 40.0 39.0 - 52.0 %  Protime-INR     Status: Abnormal   Collection Time: 12/17/16  6:27 AM  Result Value Ref Range   Prothrombin Time 26.9 (H) 11.4 - 15.2 seconds   INR 2.43     ABGS  Recent Labs  12/16/16 1103  TCO2 27   CULTURES No results found for this or any previous visit (from the past 240 hour(s)). Studies/Results: Dg Chest Portable 1 View  Result Date: 12/16/2016 CLINICAL DATA:  Weakness, dizziness EXAM: PORTABLE CHEST 1 VIEW COMPARISON:  Five/ 28/0 6 FINDINGS: Cardiomediastinal silhouette is stable. Status post CABG. Elevation of the right hemidiaphragm again noted. No infiltrate or pleural effusion. No pulmonary edema. IMPRESSION: No active disease. Status post CABG. Stable chronic elevation of the right hemidiaphragm. Electronically Signed   By: Lahoma Crocker M.D.   On: 12/16/2016 13:21    Medications:  Prior to Admission:  Prescriptions Prior to Admission   Medication Sig Dispense Refill Last Dose  . acetaminophen (TYLENOL EX ST ARTHRITIS PAIN) 500 MG tablet Take 500 mg by mouth as needed for moderate pain.    unknown  . atorvastatin (LIPITOR) 80 MG tablet Take 1 tablet (80 mg total) by mouth daily. (Patient taking differently: Take 80 mg by mouth daily at 6 PM. ) 90 tablet 3 12/15/2016 at Unknown time  . metoprolol tartrate (LOPRESSOR) 25 MG tablet TAKE ONE-HALF TABLET BY MOUTH TWICE DAILY 30 tablet 0 12/15/2016 at 1730  . Tamsulosin HCl (FLOMAX) 0.4 MG CAPS Take 0.4 mg by mouth daily.     12/15/2016 at Unknown time  . warfarin (COUMADIN) 5 MG tablet TAKE ONE & ONE-HALF TABLETS BY MOUTH ONCE DAILY OR AS DIRECTED BY CLINIC 45 tablet 6 12/15/2016 at 1730   Scheduled: . atorvastatin  80 mg Oral q1800  . Warfarin - Pharmacist Dosing Inpatient   Does not apply Q24H   Continuous:  FBX:[id number] **OR** acetaminophen  Assesment: He was admitted with symptomatic bradycardia. He has a history of chronic anticoagulation and paroxysmal atrial flutter. He has coronary disease and had bypass grafting but it's almost 30 years ago. He is currently not having bradycardia but he had heart rates into the high 30s overnight. He has had Mobitz 1 second-degree AV block and first-degree AV block. He's not having any dizziness now. Principal Problem:   Symptomatic bradycardia Active Problems:   Arteriosclerotic cardiovascular disease (ASCVD)   Hyperlipidemia   Chronic anticoagulation   Atrial flutter, paroxysmal (HCC)   Mobitz type 1 second degree atrioventricular block   1st degree AV block    Plan: No change in treatments. He will remain off beta blockers. Discuss with cardiology about whether he could be discharged    LOS: 0 days   Jabrea Kallstrom L 12/17/2016, 9:01 AM

## 2016-12-17 NOTE — Progress Notes (Signed)
[place] for Warfarin (home med) Indication: atrial fibrillation  No Known Allergies  Patient Measurements: Height: 5\' 8"  (172.7 cm) Weight: 179 lb 11.2 oz (81.5 kg) IBW/kg (Calculated) : 68.4  Vital Signs: Temp: 97.7 F (36.5 C) (04/13 0446) Temp Source: Oral (04/13 0446) BP: 128/69 (04/13 0446) Pulse Rate: 58 (04/13 0446)  Labs:  Recent Labs  12/16/16 1057 12/16/16 1103 12/17/16 0627  HGB 13.6 13.6  --   HCT 42.6 40.0  --   PLT 184  --   --   LABPROT 27.4*  --  26.9*  INR 2.49  --  2.43  CREATININE  --  0.60*  --     Estimated Creatinine Clearance: 70.1 mL/min (A) (by C-G formula based on SCr of 0.6 mg/dL (L)).   Medical History: Past Medical History:  Diagnosis Date  . Arteriosclerotic cardiovascular disease (ASCVD)    CABG surgery in 1990. MI in 1986; coronary angiography in 2003-critical LAD with patent LIMA; total obstruction of the RCA with patent RIMA; low normal EF.  [name] Atrial flutter, paroxysmal ([place])    asymptomatic; onset in 2010; 4:1 AVB with low-dose metoprolol; moderate left      atrial enlargement and mild left ventricular hypertrophy with normal ejection fraction by echocardiography  . Benign prostatic hypertrophy   . Chronic anticoagulation   . Hyperlipidemia    Lipid profile in 09/2010:196, 84, 54, 125  . Mobitz type 1 second degree atrioventricular block 12/17/2016  . Nephrolithiasis   . Polymyalgia rheumatica (HCC)    Medications:  Prescriptions Prior to Admission  Medication Sig Dispense Refill Last Dose  . acetaminophen (TYLENOL EX ST ARTHRITIS PAIN) 500 MG tablet Take 500 mg by mouth as needed for moderate pain.    unknown  . atorvastatin (LIPITOR) 80 MG tablet Take 1 tablet (80 mg total) by mouth daily. (Patient taking differently: Take 80 mg by mouth daily at 6 PM. ) 90 tablet 3 12/15/2016 at Unknown time  . metoprolol tartrate (LOPRESSOR) 25 MG tablet TAKE ONE-HALF TABLET BY MOUTH TWICE DAILY 30  tablet 0 12/15/2016 at 1730  . Tamsulosin HCl (FLOMAX) 0.4 MG CAPS Take 0.4 mg by mouth daily.     12/15/2016 at Unknown time  . warfarin (COUMADIN) 5 MG tablet TAKE ONE & ONE-HALF TABLETS BY MOUTH ONCE DAILY OR AS DIRECTED BY CLINIC 45 tablet 6 12/15/2016 at 1730   Assessment: Okay for Protocol, no bleeding noted.   Goal of Therapy:  INR 2-3   Plan:  Warfarin 7.5mg  PO x 1 Daily PT/INR Monitor for signs and symptoms of bleeding.  Biagio Quint R 12/17/2016,10:58 AM

## 2016-12-17 NOTE — Progress Notes (Signed)
Patient's IV removed and site intact. Patient sent home with personal belongings and will be mailed his 30 day heart monitor by Dr. Marthann Schiller office.

## 2016-12-17 NOTE — Progress Notes (Signed)
Progress Note  Patient Name: Shane [name] Date of Encounter: 81/13/2016  Primary Cardiologist: Dr. Harl Bowie  Subjective   Feeling great this morning. No further dizziness. No recent chest pain or SOB. Tele with NSR/SB HR mostly 50s-60s with a few episodes of Mobitz I early this AM, one of which associated with brief HR upper 30s - pt states he's been awake since 5:45am and did not feel anything with these episodes  Inpatient Medications    Scheduled Meds: . atorvastatin  80 mg Oral q1800  . Warfarin - Pharmacist Dosing Inpatient   Does not apply Q24H   Continuous Infusions:  PRN Meds: acetaminophen **OR** acetaminophen   Vital Signs    Vitals:   12/16/16 1400 12/16/16 1439 12/16/16 2057 12/17/16 0446  BP: 137/67 137/62 121/62 128/69  Pulse: 60 (!) 57 61 (!) 58  Resp: 15 18 18 18   Temp:  97.6 F (36.4 C) 98.1 F (36.7 C) 97.7 F (36.5 C)  TempSrc:  Oral Oral Oral  SpO2: 93% 96% 94% 95%  Weight:  179 lb 11.2 oz (81.5 kg)    Height:  5\' 8"  (1.727 m)      Intake/Output Summary (Last 24 hours) at 12/17/16 0825 Last data filed at 12/16/16 2100  Gross per 24 hour  Intake              200 ml  Output                0 ml  Net              200 ml   Filed Weights   12/16/16 1005 12/16/16 1439  Weight: 174 lb (78.9 kg) 179 lb 11.2 oz (81.5 kg)    Telemetry    NSR/SB HR mostly 50s-60s with a few episodes of Mobitz I early this AM, one of which associated with brief HR upper 30s. Yesterday had episode of Mobitz I with intermittent junctional escape and one blocked PAC - Personally Reviewed  ECG    SB first degree AV block no acute change compared to prior - Personally Reviewed  Physical Exam   GEN: No acute distress.  HEENT: Normocephalic, atraumatic, sclera non-icteric. Neck: No JVD or bruits. Cardiac: RRR no murmurs, rubs, or gallops.  Radials/DP/PT 1+ and equal bilaterally.  Respiratory: Clear to auscultation bilaterally. Breathing is unlabored. GI: Soft,  nontender, non-distended, BS +x 4. MS: no deformity. Extremities: No clubbing or cyanosis. No edema. Distal pedal pulses are 2+ and equal bilaterally. Neuro:  AAOx3. Follows commands. Psych:  Responds to questions appropriately with a normal affect.  Labs    Chemistry Recent Labs Lab 12/16/16 1103  NA 142  K 4.0  CL 108  GLUCOSE 96  BUN 17  CREATININE 0.60*     Hematology Recent Labs Lab 12/16/16 1057 12/16/16 1103  WBC 4.5  --   RBC 4.45  --   HGB 13.6 13.6  HCT 42.6 40.0  MCV 95.7  --   MCH 30.6  --   MCHC 31.9  --   RDW 15.1  --   PLT 184  --     Cardiac EnzymesNo results for input(s): TROPONINI in the last 168 hours.  Recent Labs Lab 12/16/16 1102  TROPIPOC 0.00     BNPNo results for input(s): BNP, PROBNP in the last 168 hours.   DDimer No results for input(s): DDIMER in the last 168 hours.   Radiology    Dg Chest Portable 1 View  Result  Date: 12/16/2016 CLINICAL DATA:  Weakness, dizziness EXAM: PORTABLE CHEST 1 VIEW COMPARISON:  Five/ 28/0 6 FINDINGS: Cardiomediastinal silhouette is stable. Status post CABG. Elevation of the right hemidiaphragm again noted. No infiltrate or pleural effusion. No pulmonary edema. IMPRESSION: No active disease. Status post CABG. Stable chronic elevation of the right hemidiaphragm. Electronically Signed   By: Lahoma Crocker M.D.   On: 12/16/2016 13:21    Cardiac Studies   N/a  Patient Profile     83 y.o. active male with CAD s/p CABG in 1990, PTCA of LAD in 2003, with CTO of RCA with patent RIMA and LIMA, paroxysmal atrial flutter, hyperlipidemia, 1st degree AV block admitted with episodes of dizziness, found to have bradycardia and Mobitz I on telemetry.  Assessment & Plan    1. Sinus bradycardia with 1st degree AVB at baseline and intermittent Mobitz 1 block - last dose of metoprolol was the PM of 4/11 per patient. He has had a few episodes of Mobitz 1 this AM while he states he was awake including one episode with HR  upper 30's briefly, but was totally asymptomatic. He feels great this AM. I will review further management with MD. Event monitor suggested in consult note. Will also add TSH to labs.  2. CAD - no recent angina. Continue statin. Not on aspirin given ongoing Coumadin and recently stable CAD status.  3. Paroxysmal atrial flutter - no recent recurrence. Maintained on Coumadin. Previously not interested in NOACs.  Signed, Charlie Pitter, PA-C  12/17/2016, 8:25 AM    The patient was seen and examined, and I agree with the history, physical exam, assessment and plan as documented above, with modifications as noted below. I have also personally reviewed all relevant documentation, labs, and telemetry.  He remains asymptomatic with sinus bradycardia, 1st degree AV block, and intermittent Mobitz 1 AV block. He remains off of metoprolol.  With regards to CAD, he is asymptomatic.  I think he is stable for discharge with a 30-day event monitor and eventual follow up with Dr. Harl Bowie.  Kate Sable, MD, [facility]  12/17/2016 9:10 AM

## 2016-12-19 NOTE — Discharge Summary (Signed)
Physician Discharge Summary  Patient ID: Shane [name] MRN: [id number] DOB/AGE: [birth year] 81 y.o. Primary Care Physician:FAGAN,ROY, MD Admit date: 12/16/2016 Discharge date: 12/19/2016    Discharge Diagnoses:   Principal Problem:   Symptomatic bradycardia Active Problems:   Arteriosclerotic cardiovascular disease (ASCVD)   Hyperlipidemia   Chronic anticoagulation   Atrial flutter, paroxysmal (HCC)   Mobitz type 1 second degree atrioventricular block   1st degree AV block   Allergies as of 12/17/2016   No Known Allergies     Medication List    STOP taking these medications   metoprolol tartrate 25 MG tablet Commonly known as:  LOPRESSOR     TAKE these medications   atorvastatin 80 MG tablet Commonly known as:  LIPITOR Take 1 tablet (80 mg total) by mouth daily. What changed:  when to take this   FLOMAX 0.4 MG Caps capsule Generic drug:  tamsulosin Take 0.4 mg by mouth daily.   TYLENOL EX ST ARTHRITIS PAIN 500 MG tablet Generic drug:  acetaminophen Take 500 mg by mouth as needed for moderate pain.   warfarin 5 MG tablet Commonly known as:  COUMADIN TAKE ONE & ONE-HALF TABLETS BY MOUTH ONCE DAILY OR AS DIRECTED BY CLINIC       Discharged Condition:Improved    Consults: Cardiology  Significant Diagnostic Studies: Dg Chest Portable 1 View  Result Date: 12/16/2016 CLINICAL DATA:  Weakness, dizziness EXAM: PORTABLE CHEST 1 VIEW COMPARISON:  Five/ 28/0 6 FINDINGS: Cardiomediastinal silhouette is stable. Status post CABG. Elevation of the right hemidiaphragm again noted. No infiltrate or pleural effusion. No pulmonary edema. IMPRESSION: No active disease. Status post CABG. Stable chronic elevation of the right hemidiaphragm. Electronically Signed   By: Lahoma Crocker M.D.   On: 12/16/2016 13:21    Lab Results: Basic Metabolic Panel:  Recent Labs  12/16/16 1103  NA 142  K 4.0  CL 108  GLUCOSE 96  BUN 17  CREATININE 0.60*   Liver Function Tests: No  results for input(s): AST, ALT, ALKPHOS, BILITOT, PROT, ALBUMIN in the last 72 hours.   CBC:  Recent Labs  12/16/16 1057 12/16/16 1103  WBC 4.5  --   NEUTROABS 2.6  --   HGB 13.6 13.6  HCT 42.6 40.0  MCV 95.7  --   PLT 184  --     No results found for this or any previous visit (from the past 240 hour(s)).   Hospital Course: He came to the emergency department because of dizziness. While he was there he was noted to have bradycardia. He was brought in for observation because of the bradycardia. He had cardiology consultation and he was noted to have AV block. He was on a beta blocker which was discontinued. He improved after being in hospital overnight had no further dizziness but did still have relative bradycardia. Discussion with cardiology was undertaken and they felt that he was safe to be discharged home and he should have an event monitor to try to decide if he might ultimately need a pacemaker. He was discharged home off beta blocker.  Discharge Exam: Blood pressure 128/69, pulse (!) 58, temperature 97.7 F (36.5 C), temperature source Oral, resp. rate 18, height 5\' 8"  (1.727 m), weight 81.5 kg (179 lb 11.2 oz), SpO2 95 %. He is awake and alert. Pulse 58. Chest is clear. His heart is regular with no abnormal heart sounds. No edema.  Disposition: Home follow up with cardiology and have event monitor follow-up with Dr. Willey Blade  Follow-up Information    Kate Sable, MD Follow up in 1 month(s).   Specialty:  Cardiology Why:  Call and make follow up appointment for May 15th or 16th. 30 days after the monitor it put on.  If you have any questions when trying to place monitor, please call [phone], and they will assist you.  Contact information: [place]  [place] [address] [phone]           Signed: Eran Mistry L   12/19/2016, 10:50 AM

## 2016-12-20 ENCOUNTER — Telehealth: Payer: Self-pay | Admitting: Adult Health

## 2016-12-20 NOTE — Telephone Encounter (Signed)
Wife notified to expect monitor over the next day or two.

## 2016-12-20 NOTE — Telephone Encounter (Signed)
Pt has not received his monitor and they're calling to check on it.

## 2016-12-27 ENCOUNTER — Telehealth: Payer: Self-pay

## 2016-12-27 NOTE — Telephone Encounter (Signed)
Ok can cancel monitor, we will reevaluate at his f/u next month  Zandra Abts MD

## 2016-12-27 NOTE — Telephone Encounter (Signed)
Pt came in to the office to let us know that he did not need to wear the event monitor. He stated that his heart rate is staying in the 60's and he does not feel like he did before the ER dr. Cristi Loron the lopressor. He stated that if he has any low heart rates, he will call to let us know.

## 2017-01-03 ENCOUNTER — Ambulatory Visit (INDEPENDENT_AMBULATORY_CARE_PROVIDER_SITE_OTHER): Payer: Medicare HMO | Admitting: *Deleted

## 2017-01-03 DIAGNOSIS — Z7901 Long term (current) use of anticoagulants: Secondary | ICD-10-CM

## 2017-01-03 DIAGNOSIS — I4892 Unspecified atrial flutter: Secondary | ICD-10-CM

## 2017-01-03 DIAGNOSIS — Z5181 Encounter for therapeutic drug level monitoring: Secondary | ICD-10-CM

## 2017-01-03 LAB — POCT INR: INR: 3.3

## 2017-01-18 ENCOUNTER — Encounter: Payer: Medicare HMO | Admitting: Adult Health

## 2017-01-21 ENCOUNTER — Encounter: Payer: Self-pay | Admitting: Physician Assistant

## 2017-01-21 ENCOUNTER — Other Ambulatory Visit: Payer: Self-pay

## 2017-01-21 ENCOUNTER — Ambulatory Visit (INDEPENDENT_AMBULATORY_CARE_PROVIDER_SITE_OTHER): Payer: Medicare HMO | Admitting: Physician Assistant

## 2017-01-21 VITALS — BP 136/70 | HR 61 | Ht 68.0 in | Wt 182.0 lb

## 2017-01-21 DIAGNOSIS — I4892 Unspecified atrial flutter: Secondary | ICD-10-CM

## 2017-01-21 DIAGNOSIS — E784 Other hyperlipidemia: Secondary | ICD-10-CM | POA: Diagnosis not present

## 2017-01-21 DIAGNOSIS — I2510 Atherosclerotic heart disease of native coronary artery without angina pectoris: Secondary | ICD-10-CM | POA: Diagnosis not present

## 2017-01-21 DIAGNOSIS — R001 Bradycardia, unspecified: Secondary | ICD-10-CM

## 2017-01-21 DIAGNOSIS — E7849 Other hyperlipidemia: Secondary | ICD-10-CM

## 2017-01-21 DIAGNOSIS — H578 Other specified disorders of eye and adnexa: Secondary | ICD-10-CM | POA: Diagnosis not present

## 2017-01-21 DIAGNOSIS — H5789 Other specified disorders of eye and adnexa: Secondary | ICD-10-CM

## 2017-01-21 NOTE — Patient Instructions (Signed)
Medication Instructions:  Your physician recommends that you continue on your current medications as directed. Please refer to the Current Medication list given to you today.   Labwork: NONE  Testing/Procedures: NONE  Follow-Up: Your physician recommends that you schedule a follow-up appointment in: 3 MONTHS ( CALL IF YOU NEED TO BE SEE SOONER)    Any Other Special Instructions Will Be Listed Below (If Applicable).     If you need a refill on your cardiac medications before your next appointment, please call your pharmacy.

## 2017-01-21 NOTE — Progress Notes (Signed)
Cardiology Office Note    Date:  01/21/2017  ID:  Shane [name], DOB July 11, 1933, MRN [id number] PCP:  Asencion Noble, MD  Cardiologist:  Dr. Harl Bowie  Chief Complaint: f/u dizziness  History of Present Illness:  Shane [name] is a 83 y.o. male with history of CAD s/p CABG in 1990, PTCA of LAD in 2003, with CTO of RCA with patent RIMA and LIMA, paroxysmal atrial flutter, PMR, hyperlipidemia, 1st degree AV block, bradycardia, Mobitz 1 who presents for post-hospital follow-up. In general he is quite active, walks 1200 miles a year and mows 2.5 acres once a week. He was admitted with worsening dizziness in 12/2016. Prior to that he had been seen recently at [facility] for tooth extraction and and was told BP was low. Upon admission 12/2016 he was found to have sinus bradycardia with with occasional Wenckebach, nonconducted PAC's, and short pauses. In general HR was in the 50s but did have brief drops to the upper 30s but asymptomatic. Metoprolol was discontinued with improvement in symptoms. TSH wnl, K 4.0, Cr 1.17, CBC wnl. 30 day event monitor was recommended but the patient declined since he was feeling better. Review of chart indicates remote echo 09/2008 with EF 55-60%, moderate basal septal hypertrophy, mild fibrocalcific change of the aortic root, mod LAE, mild RVH, mild-mod RAE.  He returns for follow-up feeling great. He's not had any recurrent dizziness. He has not had any SOB, chest pain, near-syncope or syncope. His right eye is currently quite red - he states he hit himself in the eye with a bush this AM. No eye pain or changes in vision. No discharge. Continues to walk about 25 miles a week total.   Past Medical History:  Diagnosis Date  . Arteriosclerotic cardiovascular disease (ASCVD)    CABG surgery in 1990. MI in 1986; coronary angiography in 2003-critical LAD with patent LIMA; total obstruction of the RCA with patent RIMA; low normal EF.  Shane [name] Atrial flutter, paroxysmal ([place])    asymptomatic; onset in 2010; 4:1 AVB with low-dose metoprolol; moderate left      atrial enlargement and mild left ventricular hypertrophy with normal ejection fraction by echocardiography  . Benign prostatic hypertrophy   . Bradycardia    a. requiring cessation of beta blocker 12/2016.  Shane [name] Chronic anticoagulation   . Hyperlipidemia    Lipid profile in 09/2010:196, 84, 54, 125  . Mobitz type 1 second degree atrioventricular block 12/17/2016  . Nephrolithiasis   . Polymyalgia rheumatica (HCC)     Past Surgical History:  Procedure Laterality Date  . COLONOSCOPY  08/21/01   RMR: Left-sided diverticulum.  Remainder of colonic mucosa appeared normal  . COLONOSCOPY N/A 07/31/2013   Procedure: COLONOSCOPY;  Surgeon: Danie Binder, MD;  Location: [facility];  Service: Endoscopy;  Laterality: N/A;  10:30  . CORONARY ARTERY BYPASS GRAFT  1990  . LUMBAR DISC SURGERY      Current Medications: Outpatient Medications Prior to Visit  Medication Sig Dispense Refill  . acetaminophen (TYLENOL EX ST ARTHRITIS PAIN) 500 MG tablet Take 500 mg by mouth as needed for moderate pain.     Shane [name] atorvastatin (LIPITOR) 80 MG tablet Take 1 tablet (80 mg total) by mouth daily. (Patient taking differently: Take 80 mg by mouth daily at 6 PM. ) 90 tablet 3  . Tamsulosin HCl (FLOMAX) 0.4 MG CAPS Take 0.4 mg by mouth daily.      Shane [name] warfarin (COUMADIN) 5 MG tablet TAKE ONE &  ONE-HALF TABLETS BY MOUTH ONCE DAILY OR AS DIRECTED BY CLINIC 45 tablet 6   No facility-administered medications prior to visit.      Allergies:   Patient has no known allergies.   Social History   Social History  . Marital status: Married    Spouse name: N/A  . Number of children: 3  . Years of education: N/A   Occupational History  . Retail    Social History Main Topics  . Smoking status: Former Smoker    Packs/day: 1.50    Years: 25.00    Quit date: 10/07/1982  . Smokeless tobacco: Never Used  . Alcohol use Yes     Comment: 1 bottle  of wine weekly; occasional beer  . Drug use: No  . Sexual activity: Not Asked   Other Topics Concern  . None   Social History Narrative  . None     Family History:  Family History  Problem Relation Age of Onset  . Heart attack Mother   . Coronary artery disease Brother        CABG + pacemaker  . Pneumonia Brother        infant death  . Colon cancer Neg Hx   . Colon polyps Neg Hx    ROS:   Please see the history of present illness.  All other systems are reviewed and otherwise negative.    PHYSICAL EXAM:   VS:  BP 136/70   Pulse 61   Ht 5\' 8"  (1.727 m)   Wt 182 lb (82.6 kg)   SpO2 95%   BMI 27.67 kg/m   BMI: Body mass index is 27.67 kg/m. GEN: Well nourished, well developed WM in no acute distress  HEENT: normocephalic, atraumatic, right eye erythematous injected sclera, no discharge, no hyphema Neck: no JVD, carotid bruits, or masses Cardiac:  RRR; no murmurs, rubs, or gallops, no edema  Respiratory:  clear to auscultation bilaterally, normal work of breathing GI: soft, nontender, nondistended, + BS MS: no deformity or atrophy  Skin: warm and dry, no rash Neuro:  Alert and Oriented x 3, Strength and sensation are intact, follows commands Psych: euthymic mood, full affect  Wt Readings from Last 3 Encounters:  01/21/17 182 lb (82.6 kg)  12/16/16 179 lb 11.2 oz (81.5 kg)  06/18/15 184 lb 6.4 oz (83.6 kg)      Studies/Labs Reviewed:   EKG:  EKG was ordered today and personally reviewed by me and demonstrates SB 49bpm, occasional nonconducted PACs, first degree AVB, no acute changes. Rhythm strip NSR with 1 dropped beat r/t Mobitz 1  Recent Labs: 12/16/2016: BUN 17; Creatinine, Ser 0.60; Hemoglobin 13.6; Platelets 184; Potassium 4.0; Sodium 142; TSH 0.651   Lipid Panel    Component Value Date/Time   CHOL 124 12/25/2011 0800   TRIG 40 12/25/2011 0800   TRIG 84 09/21/2010   HDL 49 12/25/2011 0800   CHOLHDL 2.5 12/25/2011 0800   VLDL 8 12/25/2011 0800    LDLCALC 67 12/25/2011 0800   LDLCALC 125 09/21/2010    Additional studies/ records that were reviewed today include: Summarized above.    ASSESSMENT & PLAN:   1. Bradycardia - associated with nonconducted PACs and Mobitz 1. Beta blocker recently stopped due to this. He is doing well. He has declined event monitoring, citing that he's had no further symptoms. Will continue to monitor closely. ER precautions reviewed. 2. Paroxysmal atrial flutter - maintaining NSR. Follow for now. Per notes he's not previously been interested  in switching to NOAC. 3. CAD - he denies any recent anginal symptoms whatsoever. Remains physically active. Not on ASA due to concomitant Coumadin. 4. Hyperlipidemia - continue statin 5. Red eye - given limited ability to evaluate this in the office I advised he contact PCP to be seen or go to urgent care today to r/o foreign body injury or corneal abrasion, etc. He states he will do so, although seems generally unconcerned about this.  Disposition: F/u with Dr. Harl Bowie in 3 months.  Medication Adjustments/Labs and Tests Ordered: Current medicines are reviewed at length with the patient today.  Concerns regarding medicines are outlined above. Medication changes, Labs and Tests ordered today are summarized above and listed in the Patient Instructions accessible in Encounters.   Signed, Charlie Pitter, PA-C  01/21/2017 3:32 PM    [facility] Location in [place], [place] [address] Ph: [phone]; Fax [phone]

## 2017-01-23 ENCOUNTER — Telehealth: Payer: Self-pay | Admitting: Cardiovascular Disease

## 2017-01-23 NOTE — Telephone Encounter (Signed)
Shane [name] called to report mild hematuria which developed today.  It is non-painful, there are no associated clots, and no liquid blood has been visible, only "red colored urine."  I reassured him that it did not seem like a medical emergency that would require presentation to the ED tonight, but that he should contact his PCP or nephrologist in the morning to schedule an evaluation for further work-up.  Clayborne Dana MD

## 2017-01-24 DIAGNOSIS — Z7901 Long term (current) use of anticoagulants: Secondary | ICD-10-CM | POA: Diagnosis not present

## 2017-01-24 DIAGNOSIS — R310 Gross hematuria: Secondary | ICD-10-CM | POA: Diagnosis not present

## 2017-02-02 ENCOUNTER — Ambulatory Visit (INDEPENDENT_AMBULATORY_CARE_PROVIDER_SITE_OTHER): Payer: Medicare HMO | Admitting: *Deleted

## 2017-02-02 DIAGNOSIS — Z5181 Encounter for therapeutic drug level monitoring: Secondary | ICD-10-CM

## 2017-02-02 DIAGNOSIS — Z7901 Long term (current) use of anticoagulants: Secondary | ICD-10-CM | POA: Diagnosis not present

## 2017-02-02 DIAGNOSIS — I4892 Unspecified atrial flutter: Secondary | ICD-10-CM

## 2017-02-02 LAB — POCT INR: INR: 2.4

## 2017-02-11 IMAGING — CT CT RENAL STONE PROTOCOL
2 of 4 series · 16 of 46 positions shown, 18 images · non-contrast
Comparison: 07/24/2014

CLINICAL DATA: Hematuria for 1 year. No pain. History of kidney
stones.

EXAM:
CT ABDOMEN AND PELVIS WITHOUT CONTRAST
TECHNIQUE: Multidetector CT imaging of the abdomen and pelvis was performed
following the standard protocol without IV contrast.

[Series 2: standard/full over (age)lbs 5.0 · axial · 0.81mm/px · z∈[-469,-49]mm · 13 of 93 slices shown, 15 images]
[im 5/93  soft-tissue]
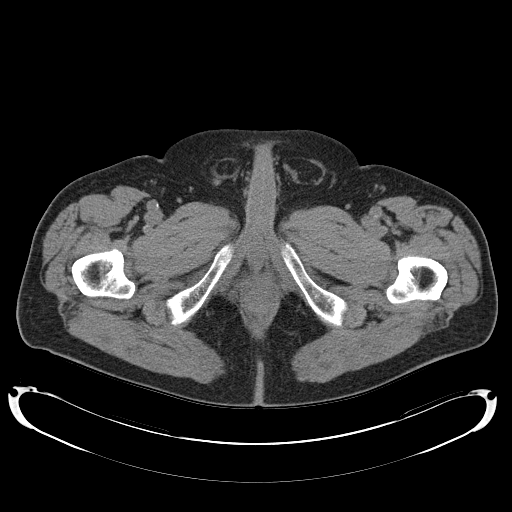
[im 5/93  bone]
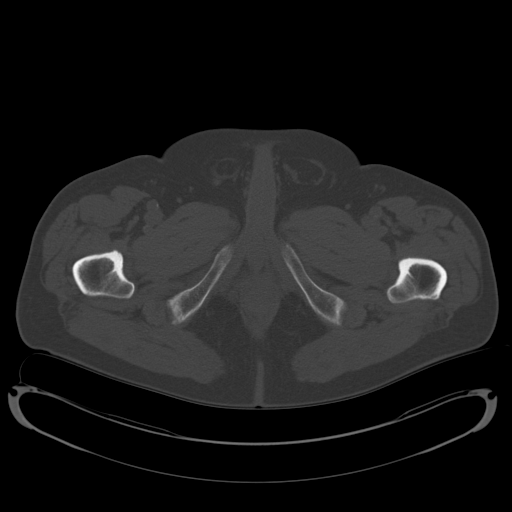
[im 13/93  soft-tissue]
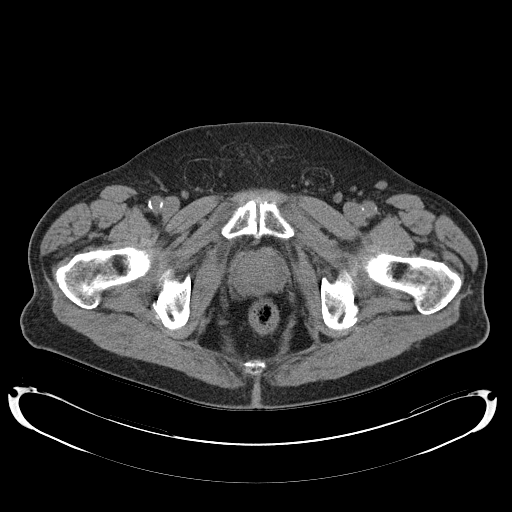
[im 21/93  soft-tissue]
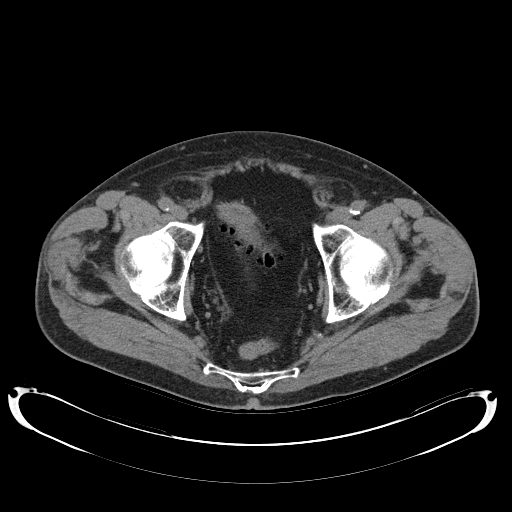
[im 25/93  soft-tissue]
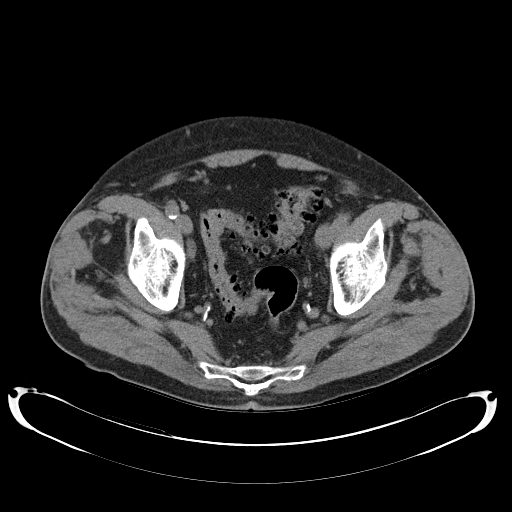
[im 33/93  soft-tissue]
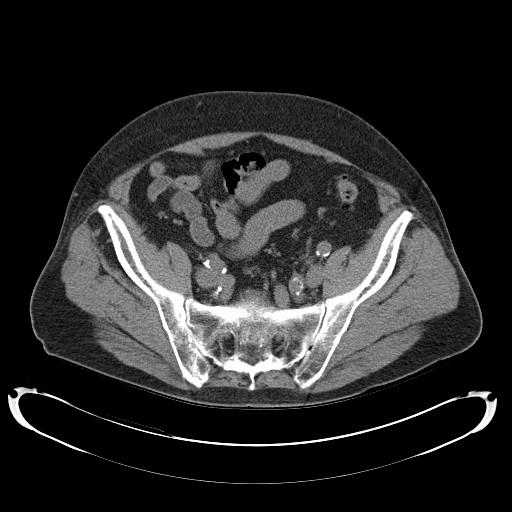
[im 41/93  soft-tissue]
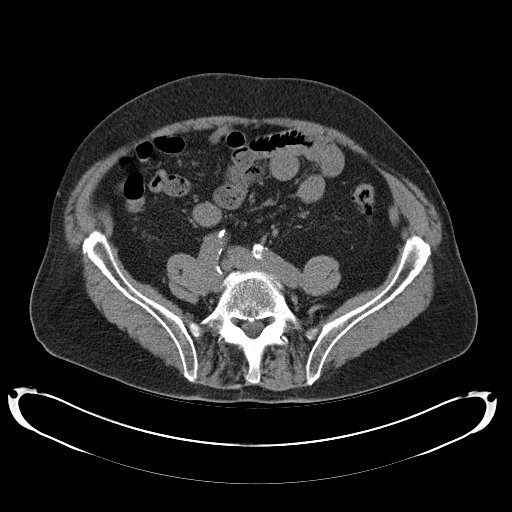
[im 49/93  soft-tissue]
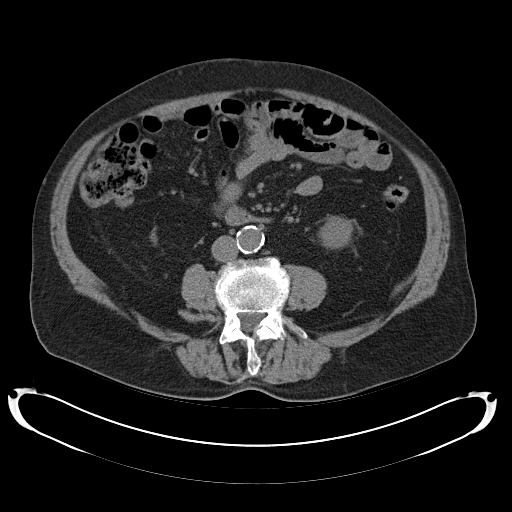
[im 53/93  soft-tissue]
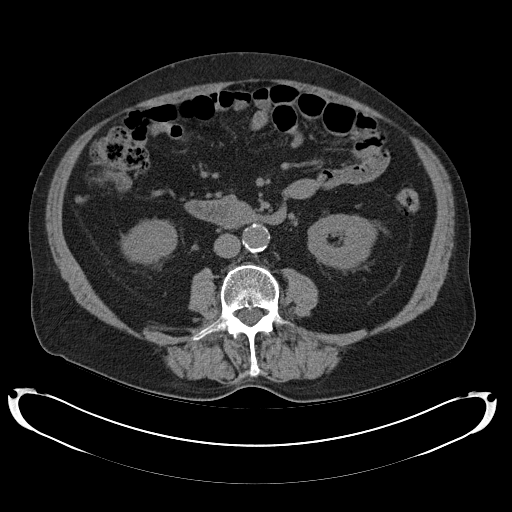
[im 61/93  soft-tissue]
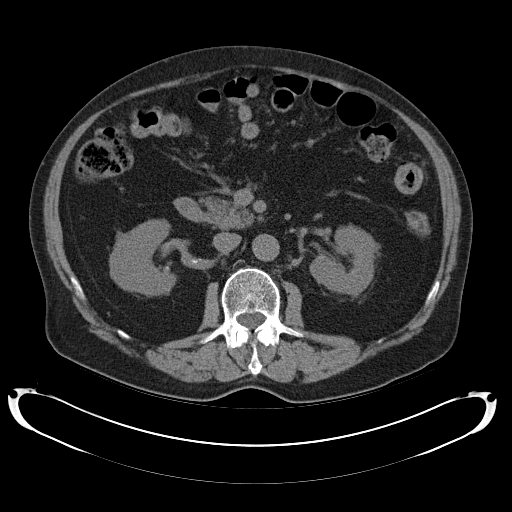
[im 61/93  bone]
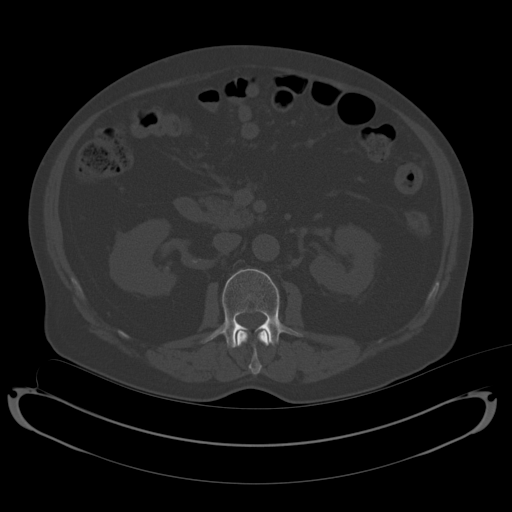
[im 69/93  soft-tissue]
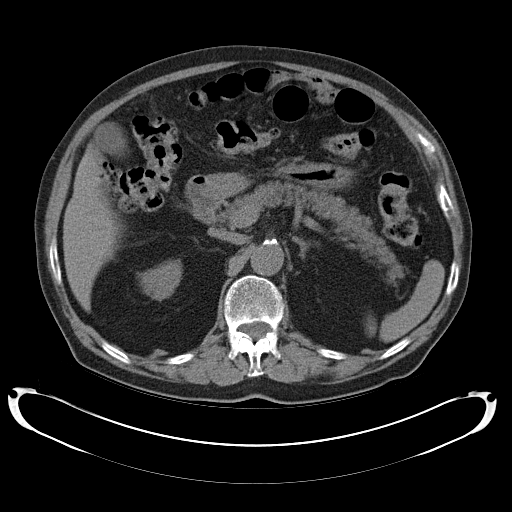
[im 73/93  soft-tissue]
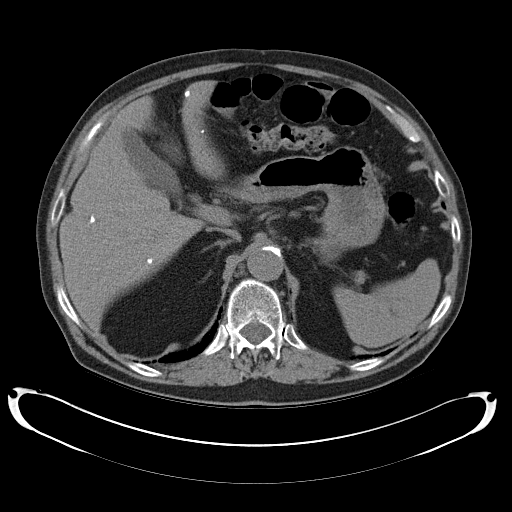
[im 81/93  soft-tissue]
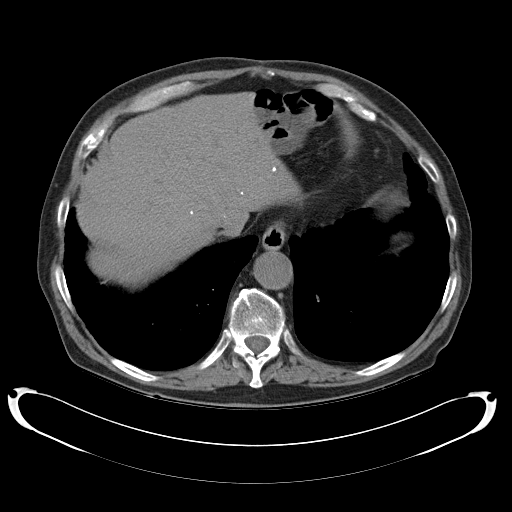
[im 89/93  soft-tissue]
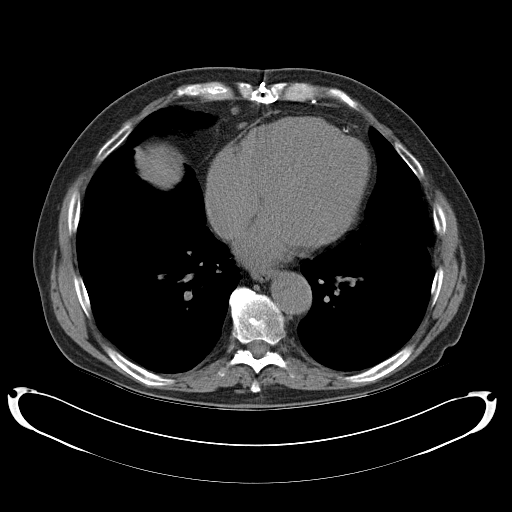

[Series 3: mpr coronal · coronal · 0.77mm/px · 3 of 102 slices shown]
[im 34/102  soft-tissue]
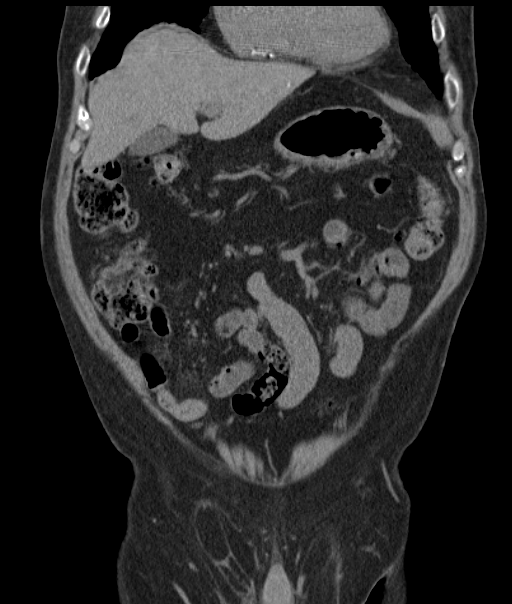
[im 45/102  soft-tissue]
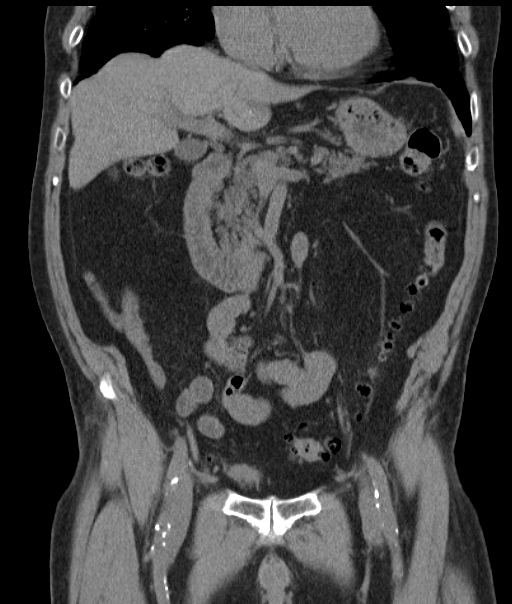
[im 57/102  soft-tissue]
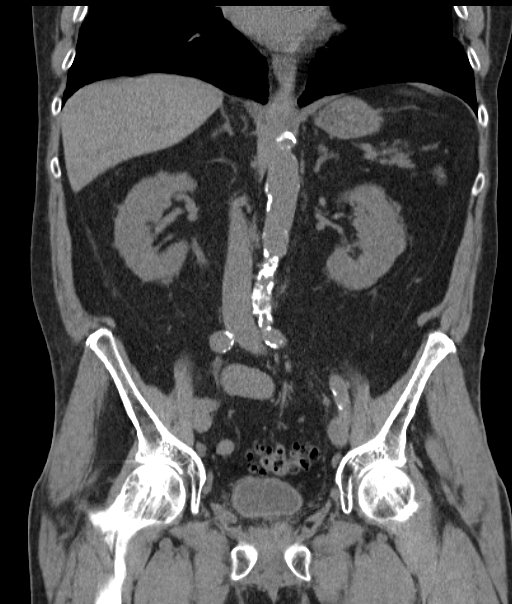

[16 of 46 positions shown; findings below may reference images not displayed]

FINDINGS: Mild scarring is again noted in the lung bases. Sequelae of prior
CABG are partially visualized.

Calcifications are again seen throughout the liver and spleen,
consistent with prior granulomatous disease. The gallbladder,
adrenal glands, and pancreas have an unremarkable unenhanced
appearance.

Mild symmetric perinephric stranding bilaterally is unchanged. There
is no hydronephrosis. There is a punctate, 1-2 mm calculus in the
upper pole of the left kidney which is new. No ureteral calculi or
dilatation is seen. Extraluminal calcification adjacent to the
proximal right ureter is unchanged.

There is no evidence of bowel obstruction. Left-sided colonic
diverticulosis is noted without evidence of diverticulitis. The
appendix is unremarkable. Swirled appearance of the mesenteric
vessels on the prior study is no longer seen.

Aortoiliac atherosclerosis is again seen. Small fat containing
bilateral inguinal hernias and a small fat containing umbilical
hernia are noted. Bladder is unremarkable. No free fluid or enlarged
lymph nodes are identified. Mild-to-moderate lower lumbar disc
degeneration is noted.
IMPRESSION: 1. New, punctate nonobstructing left renal calculus.
2. Colonic diverticulosis.

## 2017-02-15 ENCOUNTER — Ambulatory Visit (INDEPENDENT_AMBULATORY_CARE_PROVIDER_SITE_OTHER): Payer: Medicare HMO | Admitting: Urology

## 2017-02-15 DIAGNOSIS — C678 Malignant neoplasm of overlapping sites of bladder: Secondary | ICD-10-CM | POA: Diagnosis not present

## 2017-02-17 ENCOUNTER — Emergency Department (HOSPITAL_COMMUNITY)
Admission: EM | Admit: 2017-02-17 | Discharge: 2017-02-17 | Disposition: A | Discharge status: Home / Self Care | Payer: Medicare HMO | Attending: Emergency Medicine | Admitting: Emergency Medicine

## 2017-02-17 ENCOUNTER — Encounter (HOSPITAL_COMMUNITY): Payer: Self-pay | Admitting: *Deleted

## 2017-02-17 DIAGNOSIS — R339 Retention of urine, unspecified: Secondary | ICD-10-CM | POA: Diagnosis not present

## 2017-02-17 DIAGNOSIS — R1030 Lower abdominal pain, unspecified: Secondary | ICD-10-CM | POA: Insufficient documentation

## 2017-02-17 DIAGNOSIS — Z7901 Long term (current) use of anticoagulants: Secondary | ICD-10-CM | POA: Diagnosis not present

## 2017-02-17 DIAGNOSIS — R310 Gross hematuria: Secondary | ICD-10-CM | POA: Insufficient documentation

## 2017-02-17 DIAGNOSIS — R319 Hematuria, unspecified: Secondary | ICD-10-CM | POA: Diagnosis present

## 2017-02-17 DIAGNOSIS — Z87891 Personal history of nicotine dependence: Secondary | ICD-10-CM | POA: Insufficient documentation

## 2017-02-17 DIAGNOSIS — Z951 Presence of aortocoronary bypass graft: Secondary | ICD-10-CM | POA: Insufficient documentation

## 2017-02-17 LAB — URINALYSIS, MICROSCOPIC (REFLEX): SQUAMOUS EPITHELIAL / LPF: NONE SEEN

## 2017-02-17 LAB — CBC WITH DIFFERENTIAL/PLATELET
Basophils Absolute: 0 10*3/uL (ref 0.0–0.1)
Basophils Relative: 0 %
EOS ABS: 0.1 10*3/uL (ref 0.0–0.7)
Eosinophils Relative: 2 %
HEMATOCRIT: 39 % (ref 39.0–52.0)
HEMOGLOBIN: 12.5 g/dL — AB (ref 13.0–17.0)
LYMPHS ABS: 1.1 10*3/uL (ref 0.7–4.0)
Lymphocytes Relative: 21 %
MCH: 30.1 pg (ref 26.0–34.0)
MCHC: 32.1 g/dL (ref 30.0–36.0)
MCV: 94 fL (ref 78.0–100.0)
MONOS PCT: 7 %
Monocytes Absolute: 0.4 10*3/uL (ref 0.1–1.0)
NEUTROS ABS: 3.7 10*3/uL (ref 1.7–7.7)
NEUTROS PCT: 70 %
Platelets: 202 10*3/uL (ref 150–400)
RBC: 4.15 MIL/uL — ABNORMAL LOW (ref 4.22–5.81)
RDW: 14.9 % (ref 11.5–15.5)
WBC: 5.2 10*3/uL (ref 4.0–10.5)

## 2017-02-17 LAB — BASIC METABOLIC PANEL
Anion gap: 9 (ref 5–15)
BUN: 21 mg/dL — ABNORMAL HIGH (ref 6–20)
CHLORIDE: 104 mmol/L (ref 101–111)
CO2: 25 mmol/L (ref 22–32)
CREATININE: 0.74 mg/dL (ref 0.61–1.24)
Calcium: 9 mg/dL (ref 8.9–10.3)
GFR calc non Af Amer: >60 mL/min (ref >60)
Glucose, Bld: 104 mg/dL — ABNORMAL HIGH (ref 65–99)
Potassium: 4.2 mmol/L (ref 3.5–5.1)
Sodium: 138 mmol/L (ref 135–145)

## 2017-02-17 LAB — URINALYSIS, ROUTINE W REFLEX MICROSCOPIC

## 2017-02-17 LAB — PROTIME-INR
INR: 2.82
Prothrombin Time: 30.2 seconds — ABNORMAL HIGH (ref 11.4–15.2)

## 2017-02-17 NOTE — ED Triage Notes (Addendum)
Unable to void since 6am this am, is having urge to void and passing  small amount of bloody urine since this am.  Also having intense pain.

## 2017-02-17 NOTE — ED Provider Notes (Signed)
[facility] DEPT Provider Note   CSN: [id number] Arrival date & time: 02/17/17  1026     History   Chief Complaint Chief Complaint  Patient presents with  . Hematuria    HPI Shane [name] is a 83 y.o. male.  HPI Patient presents with hematuria and urinary retention. Has known prostate cancer that is supposed to be removed soon. Last night began to have some blood in the urine. States it is stained his underwear. Now he cannot urinate. Last urinated around 6 in the morning but states he had difficulty going. Now lower abdominal pain and cannot urinate. No fevers. No issues of urinary retention in the past. Sees Dr. Diona Fanti from [facility]. He is on Coumadin.   Past Medical History:  Diagnosis Date  . Arteriosclerotic cardiovascular disease (ASCVD)    CABG surgery in 1990. MI in 1986; coronary angiography in 2003-critical LAD with patent LIMA; total obstruction of the RCA with patent RIMA; low normal EF.  [name] Atrial flutter, paroxysmal ([place])    asymptomatic; onset in 2010; 4:1 AVB with low-dose metoprolol; moderate left      atrial enlargement and mild left ventricular hypertrophy with normal ejection fraction by echocardiography  . Benign prostatic hypertrophy   . Bradycardia    a. requiring cessation of beta blocker 12/2016.  [name] Chronic anticoagulation   . Hyperlipidemia    Lipid profile in 09/2010:196, 84, 54, 125  . Mobitz type 1 second degree atrioventricular block 12/17/2016  . Nephrolithiasis   . Polymyalgia rheumatica [facility])     Patient Active Problem List   Diagnosis Date Noted  . Mobitz type 1 second degree atrioventricular block 12/17/2016  . 1st degree AV block 12/17/2016  . Symptomatic bradycardia 12/16/2016  . Encounter for therapeutic drug monitoring 10/03/2013  . Colon cancer screening 07/12/2013  . Borderline hypertension 10/07/2011  . Arteriosclerotic cardiovascular disease (ASCVD)   . Hyperlipidemia   . Chronic anticoagulation   . Atrial  flutter, paroxysmal ([place])   . Nephrolithiasis 03/26/2009  . Polymyalgia rheumatica ([place]) 03/26/2009  . BENIGN PROSTATIC HYPERTROPHY, HX OF 03/26/2009    Past Surgical History:  Procedure Laterality Date  . COLONOSCOPY  08/21/01   RMR: Left-sided diverticulum.  Remainder of colonic mucosa appeared normal  . COLONOSCOPY N/A 07/31/2013   Procedure: COLONOSCOPY;  Surgeon: Danie Binder, MD;  Location: [facility];  Service: Endoscopy;  Laterality: N/A;  10:30  . CORONARY ARTERY BYPASS GRAFT  1990  . LUMBAR DISC SURGERY         Home Medications    Prior to Admission medications   Medication Sig Start Date End Date Taking? Authorizing Provider  acetaminophen (TYLENOL EX ST ARTHRITIS PAIN) 500 MG tablet Take 500 mg by mouth as needed for moderate pain.     [provider]  atorvastatin (LIPITOR) 80 MG tablet Take 1 tablet (80 mg total) by mouth daily. Patient taking differently: Take 80 mg by mouth daily at 6 PM.  12/08/16   Branch, Alphonse Guild, MD  Tamsulosin HCl (FLOMAX) 0.4 MG CAPS Take 0.4 mg by mouth daily.      [provider]  warfarin (COUMADIN) 5 MG tablet TAKE ONE & ONE-HALF TABLETS BY MOUTH ONCE DAILY OR AS DIRECTED BY CLINIC 10/04/16   Arnoldo Lenis, MD    Family History Family History  Problem Relation Age of Onset  . Heart attack Mother   . Coronary artery disease Brother        CABG + pacemaker  .  Pneumonia Brother        infant death  . Colon cancer Neg Hx   . Colon polyps Neg Hx     Social History Social History  Substance Use Topics  . Smoking status: Former Smoker    Packs/day: 1.50    Years: 25.00    Quit date: 10/07/1982  . Smokeless tobacco: Never Used  . Alcohol use Yes     Comment: 1 bottle of wine weekly; occasional beer     Allergies   Patient has no known allergies.   Review of Systems Review of Systems  Constitutional: Negative for appetite change.  HENT: Negative for congestion.   Respiratory: Negative for chest  tightness.   Gastrointestinal: Positive for abdominal pain.  Genitourinary: Positive for difficulty urinating and hematuria.  Musculoskeletal: Negative for back pain.  Skin: Negative for rash.  Neurological: Negative for numbness.  Hematological: Negative for adenopathy. Bruises/bleeds easily.  Psychiatric/Behavioral: Negative for confusion.     Physical Exam Updated Vital Signs BP 122/69 (BP Location: Left Arm)   Pulse 62   Temp 97.6 F (36.4 C) (Oral)   Resp 18   Ht 5\' 8"  (1.727 m)   Wt 82.6 kg (182 lb)   SpO2 99%   BMI 27.67 kg/m   Physical Exam  Constitutional: He appears well-developed.  Patient appears uncomfortable  HENT:  Head: Atraumatic.  Eyes: EOM are normal.  Cardiovascular: Normal rate.   Pulmonary/Chest: Effort normal.  Abdominal: Soft.  Suprapubic tenderness and fullness.  Genitourinary:  Genitourinary Comments: Blood at tip of penis.  Musculoskeletal: He exhibits no edema.  Neurological: He is alert.  Skin: Skin is warm. Capillary refill takes less than 2 seconds.     ED Treatments / Results  Labs (all labs ordered are listed, but only abnormal results are displayed) Labs Reviewed  BASIC METABOLIC PANEL - Abnormal; Notable for the following:       Result Value   Glucose, Bld 104 (*)    BUN 21 (*)    All other components within normal limits  CBC WITH DIFFERENTIAL/PLATELET - Abnormal; Notable for the following:    RBC 4.15 (*)    Hemoglobin 12.5 (*)    All other components within normal limits  URINALYSIS, ROUTINE W REFLEX MICROSCOPIC - Abnormal; Notable for the following:    Color, Urine RED (*)    APPearance TURBID (*)    Glucose, UA   (*)    Value: TEST NOT REPORTED DUE TO COLOR INTERFERENCE OF URINE PIGMENT   Hgb urine dipstick   (*)    Value: TEST NOT REPORTED DUE TO COLOR INTERFERENCE OF URINE PIGMENT   Bilirubin Urine   (*)    Value: TEST NOT REPORTED DUE TO COLOR INTERFERENCE OF URINE PIGMENT   Ketones, ur   (*)    Value: TEST  NOT REPORTED DUE TO COLOR INTERFERENCE OF URINE PIGMENT   Protein, ur   (*)    Value: TEST NOT REPORTED DUE TO COLOR INTERFERENCE OF URINE PIGMENT   Nitrite   (*)    Value: TEST NOT REPORTED DUE TO COLOR INTERFERENCE OF URINE PIGMENT   Leukocytes, UA   (*)    Value: TEST NOT REPORTED DUE TO COLOR INTERFERENCE OF URINE PIGMENT   All other components within normal limits  PROTIME-INR - Abnormal; Notable for the following:    Prothrombin Time 30.2 (*)    All other components within normal limits  URINALYSIS, MICROSCOPIC (REFLEX) - Abnormal; Notable for the following:  Bacteria, UA FEW (*)    All other components within normal limits    EKG  EKG Interpretation None       Radiology No results found.  Procedures Procedures (including critical care time)  Medications Ordered in ED Medications - No data to display   Initial Impression / Assessment and Plan / ED Course  I have reviewed the triage vital signs and the nursing notes.  Pertinent labs & imaging results that were available during my care of the patient were reviewed by me and considered in my medical decision making (see chart for details).      patient with hematuria and urinary obstruction. Feels better after Foley catheter. He is on Coumadin. Had some irrigation done here also. Has urology follow-up. Hemoglobin mildly decreased only to be followed. Discharge home.  Final Clinical Impressions(s) / ED Diagnoses   Final diagnoses:  Gross hematuria  Urinary retention    New Prescriptions Discharge Medication List as of 02/17/2017  1:08 PM       Davonna Belling, MD 02/17/17 1504

## 2017-02-17 NOTE — ED Notes (Signed)
Irrigated pt bladder and retrieve multiple small clots.

## 2017-02-17 NOTE — ED Notes (Addendum)
Leg bag applied to leg with strap. Pt verbalizes understanding of leg bag care and did return demonstration of how to empty bag.

## 2017-02-17 NOTE — ED Notes (Addendum)
Pt made aware to return if symptoms worsen or if any life threatening symptoms occur.  Pt also given bedside bag and instructed on use and how to empty bag and attach bag to foley.

## 2017-02-18 ENCOUNTER — Other Ambulatory Visit: Payer: Self-pay | Admitting: Urology

## 2017-02-18 ENCOUNTER — Telehealth: Payer: Self-pay | Admitting: Cardiology

## 2017-02-18 DIAGNOSIS — C679 Malignant neoplasm of bladder, unspecified: Secondary | ICD-10-CM

## 2017-02-18 DIAGNOSIS — R319 Hematuria, unspecified: Secondary | ICD-10-CM

## 2017-02-18 NOTE — Telephone Encounter (Signed)
Pts wife called and reports that pt is peeing blood. She states it is bright red. She states he was told to come off his coumadin for Xrays which are scheduled for next week (02/24/17) by Dr. Richardo Hanks office with [facility]. She believes a procedure will follow.   Called Dr. Richardo Hanks office for clarification. 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 She states pt was instructed to call cardiology about coming off Coumadin for a few days to allow bleeding to stop. He has foley placed which cannot be removed until bleeding resolves. Discussed that pt on warfarin for afib with CHADS of 2 (age and possible HTN) and no history of stroke/TIA thus it would be reasonable for him to hold. They suggested holding 3 days advised this would be ok given risk and current bleeding situation. He will resume with usual Coumadin dose on Monday.   LMOM with detailed instructions as instructed by wife as they would be out of the house this afternoon. Advised to call on call doctor or proceed to ER with any urgent needs.

## 2017-02-18 NOTE — Telephone Encounter (Signed)
States that patient has been bleeding from Penis and wants to know if he can off of Coumadin. / tg

## 2017-02-21 ENCOUNTER — Other Ambulatory Visit: Payer: Self-pay | Admitting: Urology

## 2017-02-24 ENCOUNTER — Encounter (HOSPITAL_COMMUNITY): Payer: Self-pay

## 2017-02-24 ENCOUNTER — Ambulatory Visit (HOSPITAL_COMMUNITY)
Admission: RE | Admit: 2017-02-24 | Discharge: 2017-02-24 | Disposition: A | Discharge status: Home / Self Care | Payer: Medicare HMO | Source: Ambulatory Visit | Attending: Urology | Admitting: Urology

## 2017-02-24 ENCOUNTER — Encounter (HOSPITAL_COMMUNITY)
Admission: RE | Admit: 2017-02-24 | Discharge: 2017-02-24 | Disposition: A | Discharge status: Home / Self Care | Payer: Medicare HMO | Source: Ambulatory Visit | Attending: Urology | Admitting: Urology

## 2017-02-24 DIAGNOSIS — I2510 Atherosclerotic heart disease of native coronary artery without angina pectoris: Secondary | ICD-10-CM | POA: Diagnosis not present

## 2017-02-24 DIAGNOSIS — N400 Enlarged prostate without lower urinary tract symptoms: Secondary | ICD-10-CM | POA: Insufficient documentation

## 2017-02-24 DIAGNOSIS — K4090 Unilateral inguinal hernia, without obstruction or gangrene, not specified as recurrent: Secondary | ICD-10-CM | POA: Insufficient documentation

## 2017-02-24 DIAGNOSIS — I700 Atherosclerosis of aorta: Secondary | ICD-10-CM | POA: Diagnosis not present

## 2017-02-24 DIAGNOSIS — C679 Malignant neoplasm of bladder, unspecified: Secondary | ICD-10-CM

## 2017-02-24 DIAGNOSIS — R319 Hematuria, unspecified: Secondary | ICD-10-CM

## 2017-02-24 DIAGNOSIS — K5730 Diverticulosis of large intestine without perforation or abscess without bleeding: Secondary | ICD-10-CM | POA: Diagnosis not present

## 2017-02-24 DIAGNOSIS — N200 Calculus of kidney: Secondary | ICD-10-CM | POA: Insufficient documentation

## 2017-02-24 MED ORDER — IOPAMIDOL (ISOVUE-300) INJECTION 61%
125.0000 mL | Freq: Once | INTRAVENOUS | Status: AC | PRN
  Administered 2017-02-24: 125 mL via INTRAVENOUS

## 2017-02-24 NOTE — Patient Instructions (Signed)
Shane [name]  02/24/2017     @PREFPERIOPPHARMACY @   Your procedure is scheduled on 03/01/2017.  Report to [facility] at 11:15 A.M.  Call this number if you have problems the morning of surgery:  [phone]   Remember:  Do not eat food or drink liquids after midnight.  Take these medicines the morning of surgery with A SIP OF WATER flomax   Do not wear jewelry, make-up or nail polish.  Do not wear lotions, powders, or perfumes, or deoderant.  Do not shave 48 hours prior to surgery.  Men may shave face and neck.  Do not bring valuables to the hospital.  [facility] is not responsible for any belongings or valuables.  Contacts, dentures or bridgework may not be worn into surgery.  Leave your suitcase in the car.  After surgery it may be brought to your room.  For patients admitted to the hospital, discharge time will be determined by your treatment team.  Patients discharged the day of surgery will not be allowed to drive home.    Please read over the following fact sheets that you were given. Surgical Site Infection Prevention and Anesthesia Post-op Instructions     PATIENT INSTRUCTIONS POST-ANESTHESIA  IMMEDIATELY FOLLOWING SURGERY:  Do not drive or operate machinery for the first twenty four hours after surgery.  Do not make any important decisions for twenty four hours after surgery or while taking narcotic pain medications or sedatives.  If you develop intractable nausea and vomiting or a severe headache please notify your doctor immediately.  FOLLOW-UP:  Please make an appointment with your surgeon as instructed. You do not need to follow up with anesthesia unless specifically instructed to do so.  WOUND CARE INSTRUCTIONS (if applicable):  Keep a dry clean dressing on the anesthesia/puncture wound site if there is drainage.  Once the wound has quit draining you may leave it open to air.  Generally you should leave the bandage intact for twenty four hours unless there  is drainage.  If the epidural site drains for more than 36-48 hours please call the anesthesia department.  QUESTIONS?:  Please feel free to call your physician or the hospital operator if you have any questions, and they will be happy to assist you.      Transurethral Resection of Bladder Tumor Transurethral resection of a bladder tumor is the removal (resection) of a cancerous growth (tumor) on the inside wall of the bladder. The bladder is the organ that holds urine. The tumor is removed through the tube that carries urine out of the body (urethra). In a transurethral resection, a thin telescope with a light, a tiny camera, and an electric cutting edge (resectoscope) is passed through the urethra. In men, the opening of the urethra is at the end of the penis. In women, it is just above the vaginal opening. Tell a health care provider about:  Any allergies you have.  All medicines you are taking, including vitamins, herbs, eye drops, creams, and over-the-counter medicines.  Any problems you or family members have had with anesthetic medicines.  Any blood disorders you have.  Any surgeries you have had.  Any medical conditions you have.  Any recent urinary tract infections you have had.  Whether you are pregnant or may be pregnant. What are the risks? Generally, this is a safe procedure. However, problems may occur, including:  Infection.  Bleeding.  Allergic reactions to medicines.  Damage to other structures or organs, such as: ?  The urethra. ? The tubes that drain urine from the kidneys into the bladder (ureters).  Pain and burning during urination.  Difficulty urinating due to partial blockage of the urethra.  Inability to urinate (urinary retention).  What happens before the procedure?  Follow instructions from your health care provider about eating and drinking restrictions.  Ask your health care provider about: ? Changing or stopping your regular medicines.  This is especially important if you are taking diabetes medicines or blood thinners. ? Taking medicines such as aspirin and ibuprofen. These medicines can thin your blood. Do not take these medicines before your procedure if your health care provider instructs you not to.  You may have a physical exam.  You may have tests, including: ? Blood tests. ? Urine tests. ? Electrocardiogram (ECG). This test measures the electrical activity of the heart.  You may be given antibiotic medicine to help prevent infection.  Ask your health care provider how your surgical site will be marked or identified.  Plan to have someone take you home after the procedure. What happens during the procedure?  To reduce your risk of infection: ? Your health care team will wash or sanitize their hands. ? Your skin will be washed with soap.  An IV tube will be inserted into one of your veins.  You will be given one or more of the following: ? A medicine to help you relax (sedative). ? A medicine to make you fall asleep (general anesthetic). ? A medicine that is injected into your spine to numb the area below and slightly above the injection site (spinal anesthetic).  Your legs will be placed in foot rests (stirrups) so that your legs are apart and your knees are bent.  The resectoscope will be passed through your urethra and into your bladder.  The part of your bladder that is affected by the tumor will be resected using the cutting edge of the resectoscope.  The resectoscope will be removed.  A thin, flexible tube (catheter) will be passed through your urethra and into your bladder. The catheter will drain urine into a bag outside of your body. ? Fluid may be passed through the catheter to keep the catheter open. The procedure may vary among health care providers and hospitals. What happens after the procedure?  Your blood pressure, heart rate, breathing rate, and blood oxygen level will be monitored  often until the medicines you were given have worn off.  You may continue to receive fluids and medicines through an IV tube.  You will have some pain. Pain medicine will be available to help you.  You will have a catheter draining your urine. ? You will have blood in your urine. Your catheter may be kept in until your urine is clear. ? Your urinary drainage will be monitored. If necessary, your bladder may be rinsed out (irrigated) by passing fluid through your catheter.  You will be encouraged to walk around as soon as possible.  You may have to wear compression stockings. These stockings help to prevent blood clots and reduce swelling in your legs.  Do not drive for 24 hours if you received a sedative. This information is not intended to replace advice given to you by your health care provider. Make sure you discuss any questions you have with your health care provider. Document Released: 06/19/2009 Document Revised: 04/25/2016 Document Reviewed: 05/15/2015 Elsevier Interactive Patient Education  2018 [employer].

## 2017-03-01 ENCOUNTER — Ambulatory Visit (HOSPITAL_COMMUNITY): Payer: Medicare HMO | Admitting: Anesthesiology

## 2017-03-01 ENCOUNTER — Encounter (HOSPITAL_COMMUNITY)
Admission: RE | Disposition: A | Discharge status: Home / Self Care | Payer: Self-pay | Source: Ambulatory Visit | Attending: Urology

## 2017-03-01 ENCOUNTER — Encounter (HOSPITAL_COMMUNITY): Payer: Self-pay

## 2017-03-01 ENCOUNTER — Observation Stay (HOSPITAL_COMMUNITY)
Admission: RE | Admit: 2017-03-01 | Discharge: 2017-03-02 | Disposition: A | Discharge status: Home / Self Care | Payer: Medicare HMO | Source: Ambulatory Visit | Attending: Urology | Admitting: Urology

## 2017-03-01 DIAGNOSIS — I517 Cardiomegaly: Secondary | ICD-10-CM | POA: Diagnosis not present

## 2017-03-01 DIAGNOSIS — I441 Atrioventricular block, second degree: Secondary | ICD-10-CM | POA: Insufficient documentation

## 2017-03-01 DIAGNOSIS — I252 Old myocardial infarction: Secondary | ICD-10-CM | POA: Insufficient documentation

## 2017-03-01 DIAGNOSIS — D494 Neoplasm of unspecified behavior of bladder: Secondary | ICD-10-CM | POA: Diagnosis present

## 2017-03-01 DIAGNOSIS — N400 Enlarged prostate without lower urinary tract symptoms: Secondary | ICD-10-CM | POA: Diagnosis not present

## 2017-03-01 DIAGNOSIS — C678 Malignant neoplasm of overlapping sites of bladder: Secondary | ICD-10-CM | POA: Diagnosis not present

## 2017-03-01 DIAGNOSIS — Z951 Presence of aortocoronary bypass graft: Secondary | ICD-10-CM | POA: Insufficient documentation

## 2017-03-01 DIAGNOSIS — Z7901 Long term (current) use of anticoagulants: Secondary | ICD-10-CM | POA: Insufficient documentation

## 2017-03-01 DIAGNOSIS — C673 Malignant neoplasm of anterior wall of bladder: Secondary | ICD-10-CM | POA: Insufficient documentation

## 2017-03-01 DIAGNOSIS — I4892 Unspecified atrial flutter: Secondary | ICD-10-CM | POA: Diagnosis not present

## 2017-03-01 DIAGNOSIS — Z87891 Personal history of nicotine dependence: Secondary | ICD-10-CM | POA: Insufficient documentation

## 2017-03-01 DIAGNOSIS — I4891 Unspecified atrial fibrillation: Secondary | ICD-10-CM | POA: Diagnosis not present

## 2017-03-01 DIAGNOSIS — Z8249 Family history of ischemic heart disease and other diseases of the circulatory system: Secondary | ICD-10-CM | POA: Diagnosis not present

## 2017-03-01 DIAGNOSIS — M353 Polymyalgia rheumatica: Secondary | ICD-10-CM | POA: Insufficient documentation

## 2017-03-01 DIAGNOSIS — C675 Malignant neoplasm of bladder neck: Principal | ICD-10-CM | POA: Insufficient documentation

## 2017-03-01 DIAGNOSIS — E785 Hyperlipidemia, unspecified: Secondary | ICD-10-CM | POA: Diagnosis not present

## 2017-03-01 DIAGNOSIS — I2510 Atherosclerotic heart disease of native coronary artery without angina pectoris: Secondary | ICD-10-CM | POA: Diagnosis not present

## 2017-03-01 HISTORY — PX: TRANSURETHRAL RESECTION OF BLADDER TUMOR: SHX2575

## 2017-03-01 SURGERY — TURBT (TRANSURETHRAL RESECTION OF BLADDER TUMOR)
Anesthesia: General | Site: Bladder

## 2017-03-01 MED ORDER — BELLADONNA ALKALOIDS-OPIUM 16.2-60 MG RE SUPP
1.0000 | Freq: Four times a day (QID) | RECTAL | Status: DC | PRN
  Administered 2017-03-01: 1 via RECTAL
  Filled 2017-03-01 (×2): qty 1

## 2017-03-01 MED ORDER — OXYCODONE HCL 5 MG PO TABS
5.0000 mg | ORAL_TABLET | ORAL | Status: DC | PRN
  Administered 2017-03-01: 5 mg via ORAL
  Filled 2017-03-01: qty 1

## 2017-03-01 MED ORDER — SODIUM CHLORIDE 0.45 % IV SOLN
INTRAVENOUS | Status: DC
  Administered 2017-03-01 – 2017-03-02 (×2): via INTRAVENOUS

## 2017-03-01 MED ORDER — ACETAMINOPHEN 325 MG PO TABS
ORAL_TABLET | ORAL | Status: AC
  Filled 2017-03-01: qty 2

## 2017-03-01 MED ORDER — ATORVASTATIN CALCIUM 40 MG PO TABS
80.0000 mg | ORAL_TABLET | Freq: Every evening | ORAL | Status: DC
  Administered 2017-03-01: 80 mg via ORAL
  Filled 2017-03-01: qty 2

## 2017-03-01 MED ORDER — LACTATED RINGERS IV SOLN
INTRAVENOUS | Status: DC
  Administered 2017-03-01 (×2): via INTRAVENOUS

## 2017-03-01 MED ORDER — SULFAMETHOXAZOLE-TRIMETHOPRIM 800-160 MG PO TABS
1.0000 | ORAL_TABLET | Freq: Two times a day (BID) | ORAL | Status: DC
  Administered 2017-03-01 – 2017-03-02 (×3): 1 via ORAL
  Filled 2017-03-01 (×3): qty 1

## 2017-03-01 MED ORDER — STERILE WATER FOR IRRIGATION IR SOLN
Status: DC | PRN
  Administered 2017-03-01: 1000 mL

## 2017-03-01 MED ORDER — FENTANYL CITRATE (PF) 100 MCG/2ML IJ SOLN
25.0000 ug | INTRAMUSCULAR | Status: DC | PRN
  Administered 2017-03-01: 50 ug via INTRAVENOUS
  Filled 2017-03-01: qty 2

## 2017-03-01 MED ORDER — CEFAZOLIN SODIUM-DEXTROSE 2-4 GM/100ML-% IV SOLN
2.0000 g | INTRAVENOUS | Status: DC
Start: 2017-03-01 — End: 2017-03-02
  Filled 2017-03-01: qty 100

## 2017-03-01 MED ORDER — OXYCODONE HCL 5 MG PO TABS: 5.0000 mg | ORAL_TABLET | Freq: Once | ORAL | Status: DC | PRN

## 2017-03-01 MED ORDER — SODIUM CHLORIDE 0.9 % IV SOLN: 50.0000 mg | Freq: Once | INTRAVENOUS | Status: DC

## 2017-03-01 MED ORDER — OXYCODONE HCL 5 MG/5ML PO SOLN: 5.0000 mg | Freq: Once | ORAL | Status: DC | PRN

## 2017-03-01 MED ORDER — MIDAZOLAM HCL 2 MG/2ML IJ SOLN
1.0000 mg | Freq: Once | INTRAMUSCULAR | Status: AC | PRN
  Administered 2017-03-01: 2 mg via INTRAVENOUS

## 2017-03-01 MED ORDER — MIDAZOLAM HCL 2 MG/2ML IJ SOLN
INTRAMUSCULAR | Status: AC
Start: 2017-03-01 — End: 2017-03-01
  Filled 2017-03-01: qty 2

## 2017-03-01 MED ORDER — EPIRUBICIN HCL CHEMO IV INJECTION 50 MG/25ML
50.0000 mg | Freq: Once | INTRAVENOUS | Status: AC
  Administered 2017-03-01: 50 mg via INTRAVESICAL
  Filled 2017-03-01: qty 25

## 2017-03-01 MED ORDER — ACETAMINOPHEN 325 MG PO TABS
650.0000 mg | ORAL_TABLET | Freq: Once | ORAL | Status: AC
  Administered 2017-03-01: 650 mg via ORAL

## 2017-03-01 MED ORDER — ORAL CARE MOUTH RINSE
15.0000 mL | Freq: Two times a day (BID) | OROMUCOSAL | Status: DC
  Administered 2017-03-01: 15 mL via OROMUCOSAL

## 2017-03-01 MED ORDER — CEFAZOLIN SODIUM-DEXTROSE 1-4 GM/50ML-% IV SOLN
INTRAVENOUS | Status: AC
  Filled 2017-03-01: qty 50

## 2017-03-01 MED ORDER — CEFAZOLIN SODIUM-DEXTROSE 2-4 GM/100ML-% IV SOLN
2.0000 g | INTRAVENOUS | Status: AC
  Administered 2017-03-01: 2 g via INTRAVENOUS

## 2017-03-01 MED ORDER — ACETAMINOPHEN 325 MG PO TABS: 650.0000 mg | ORAL_TABLET | ORAL | Status: DC | PRN

## 2017-03-01 MED ORDER — FENTANYL CITRATE (PF) 100 MCG/2ML IJ SOLN
INTRAMUSCULAR | Status: DC | PRN
  Administered 2017-03-01 (×7): 25 ug via INTRAVENOUS
  Administered 2017-03-01: 50 ug via INTRAVENOUS
  Administered 2017-03-01: 25 ug via INTRAVENOUS

## 2017-03-01 MED ORDER — PROPOFOL 10 MG/ML IV BOLUS
INTRAVENOUS | Status: AC
  Filled 2017-03-01: qty 20

## 2017-03-01 MED ORDER — FENTANYL CITRATE (PF) 250 MCG/5ML IJ SOLN
INTRAMUSCULAR | Status: AC
  Filled 2017-03-01: qty 5

## 2017-03-01 MED ORDER — SODIUM CHLORIDE 0.9 % IR SOLN
Status: DC | PRN
Start: 2017-03-01 — End: 2017-03-01
  Administered 2017-03-01 (×5): 3000 mL

## 2017-03-01 MED ORDER — SENNA 8.6 MG PO TABS
1.0000 | ORAL_TABLET | Freq: Two times a day (BID) | ORAL | Status: DC
  Administered 2017-03-01 – 2017-03-02 (×3): 8.6 mg via ORAL
  Filled 2017-03-01 (×3): qty 1

## 2017-03-01 MED ORDER — PROPOFOL 10 MG/ML IV BOLUS
INTRAVENOUS | Status: DC | PRN
Start: 2017-03-01 — End: 2017-03-01
  Administered 2017-03-01 (×2): 20 mg via INTRAVENOUS
  Administered 2017-03-01: 30 mg via INTRAVENOUS
  Administered 2017-03-01: 80 mg via INTRAVENOUS

## 2017-03-01 MED ORDER — TAMSULOSIN HCL 0.4 MG PO CAPS
0.4000 mg | ORAL_CAPSULE | Freq: Every day | ORAL | Status: DC
  Administered 2017-03-01 – 2017-03-02 (×2): 0.4 mg via ORAL
  Filled 2017-03-01 (×2): qty 1

## 2017-03-01 SURGICAL SUPPLY — 26 items
BAG DRAIN URO TABLE W/ADPT NS (DRAPE) ×2 IMPLANT
BAG DRN 8 ADPR NS SKTRN CSTL (DRAPE) ×1
BAG HAMPER (MISCELLANEOUS) ×2 IMPLANT
BAG URINE DRAINAGE (UROLOGICAL SUPPLIES) ×1 IMPLANT
BAG URINE LEG 500ML (DRAIN) ×2 IMPLANT
CATH FOLEY 2WAY SLVR  5CC 22FR (CATHETERS) ×1
CATH FOLEY 2WAY SLVR 5CC 22FR (CATHETERS) IMPLANT
CLOTH BEACON ORANGE TIMEOUT ST (SAFETY) ×2 IMPLANT
ELECT LOOP 22F BIPOLAR SML (ELECTROSURGICAL) ×2
ELECT REM PT RETURN 9FT ADLT (ELECTROSURGICAL) ×2
ELECTRODE LOOP 22F BIPOLAR SML (ELECTROSURGICAL) ×1 IMPLANT
ELECTRODE REM PT RTRN 9FT ADLT (ELECTROSURGICAL) ×1 IMPLANT
GLOVE BIO SURGEON STRL SZ8 (GLOVE) ×2 IMPLANT
GLOVE BIOGEL PI IND STRL 7.0 (GLOVE) ×1 IMPLANT
GLOVE BIOGEL PI INDICATOR 7.0 (GLOVE) ×1
GLOVE ECLIPSE 6.5 STRL STRAW (GLOVE) ×1 IMPLANT
GOWN STRL REUS W/TWL LRG LVL3 (GOWN DISPOSABLE) ×2 IMPLANT
GOWN STRL REUS W/TWL XL LVL3 (GOWN DISPOSABLE) ×2 IMPLANT
IV NS IRRIG 3000ML ARTHROMATIC (IV SOLUTION) ×6 IMPLANT
KIT ROOM TURNOVER AP CYSTO (KITS) ×2 IMPLANT
MANIFOLD NEPTUNE II (INSTRUMENTS) ×2 IMPLANT
PACK CYSTO (CUSTOM PROCEDURE TRAY) ×2 IMPLANT
PAD ARMBOARD 7.5X6 YLW CONV (MISCELLANEOUS) ×2 IMPLANT
SYR 10ML LL (SYRINGE) ×2 IMPLANT
SYRINGE IRR TOOMEY STRL 70CC (SYRINGE) ×4 IMPLANT
WATER STERILE IRR 1000ML POUR (IV SOLUTION) ×2 IMPLANT

## 2017-03-01 NOTE — Anesthesia Preprocedure Evaluation (Signed)
Anesthesia Evaluation  Patient identified by MRN, date of birth, ID band Patient awake    Airway Mallampati: I  TM Distance: >3 FB Neck ROM: Full    Dental  (+) Teeth Intact   Pulmonary former smoker,    Pulmonary exam normal        Cardiovascular hypertension, + dysrhythmias Atrial Fibrillation  Rhythm:Regular Rate:Abnormal  Dropped beat, regularly irregular   Neuro/Psych negative neurological ROS  negative psych ROS   GI/Hepatic negative GI ROS, Neg liver ROS,   Endo/Other  negative endocrine ROS  Renal/GU Renal disease     Musculoskeletal negative musculoskeletal ROS (+)   Abdominal Normal abdominal exam  (+)   Peds negative pediatric ROS (+)  Hematology negative hematology ROS (+)   Anesthesia Other Findings   Reproductive/Obstetrics negative OB ROS                             Anesthesia Physical Anesthesia Plan  ASA: II  Anesthesia Plan: General   Post-op Pain Management:    Induction: Intravenous  PONV Risk Score and Plan:   Airway Management Planned: LMA  Additional Equipment:   Intra-op Plan:   Post-operative Plan:   Informed Consent: I have reviewed the patients History and Physical, chart, labs and discussed the procedure including the risks, benefits and alternatives for the proposed anesthesia with the patient or authorized representative who has indicated his/her understanding and acceptance.     Plan Discussed with: CRNA  Anesthesia Plan Comments:         Anesthesia Quick Evaluation

## 2017-03-01 NOTE — Transfer of Care (Signed)
Immediate Anesthesia Transfer of Care Note  Patient: Shane [name]  Procedure(s) Performed: Procedure(s): TRANSURETHRAL RESECTION OF BLADDER TUMOR (TURBT) (N/A)  Patient Location: PACU  Anesthesia Type:General  Level of Consciousness: awake and alert   Airway & Oxygen Therapy: Patient Spontanous Breathing  Post-op Assessment: Report given to RN and Post -op Vital signs reviewed and stable  Post vital signs: Reviewed and stable  Last Vitals:  Vitals:   03/01/17 1111  Pulse: 63  Resp: 18  Temp: 36.5 C    Last Pain:  Vitals:   03/01/17 1111  TempSrc: Oral  PainSc: 0-No pain         Complications: No apparent anesthesia complications

## 2017-03-01 NOTE — H&P (Signed)
H&P  Chief Complaint: bladder cancer  History of Present Illness: 83 year old male presents at this time for TURBT.  He presented to my office,sent by Dr. Willey Blade for gross hematuria. He was seen here in 2015 for microscopic hematuria. CT scan without contrast revealed tiny renal calculi. He did not show up for follow-up cystoscopy. 5-6 weeks ago he began having gross hematuria which was painless, total, not associated with change in urinary pattern.   He does have a remote history of cigarette smoking but stopped in the 1980s.   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 He presents at this time for TURBT with possible placement of epirubicin postoperatively.   Past Medical History:  Diagnosis Date  . Arteriosclerotic cardiovascular disease (ASCVD)    CABG surgery in 1990. MI in 1986; coronary angiography in 2003-critical LAD with patent LIMA; total obstruction of the RCA with patent RIMA; low normal EF.  [name] Atrial flutter, paroxysmal ([place])    asymptomatic; onset in 2010; 4:1 AVB with low-dose metoprolol; moderate left      atrial enlargement and mild left ventricular hypertrophy with normal ejection fraction by echocardiography  . Benign prostatic hypertrophy   . Bradycardia    a. requiring cessation of beta blocker 12/2016.  . Cancer ([place])   . Chronic anticoagulation   . Hyperlipidemia    Lipid profile in 09/2010:196, 84, 54, 125  . Mobitz type 1 second degree atrioventricular block 12/17/2016  . Nephrolithiasis   . Polymyalgia rheumatica ([facility])     Past Surgical History:  Procedure Laterality Date  . COLONOSCOPY  08/21/01   RMR: Left-sided diverticulum.  Remainder of colonic mucosa appeared normal  . COLONOSCOPY N/A 07/31/2013   Procedure: COLONOSCOPY;  Surgeon: Danie Binder, MD;  Location: [facility];  Service: Endoscopy;  Laterality: N/A;  10:30  . CORONARY ARTERY BYPASS GRAFT  1990  . LUMBAR DISC SURGERY       Home Medications:  Allergies as of 03/01/2017   No Known Allergies     Medication List    Notice   Cannot display discharge medications because the patient has not yet been admitted.     Allergies: No Known Allergies  Family History  Problem Relation Age of Onset  . Heart attack Mother   . Coronary artery disease Brother        CABG + pacemaker  . Pneumonia Brother        infant death  . Colon cancer Neg Hx   . Colon polyps Neg Hx     Social History:  reports that he quit smoking about 34 years ago. He has a 37.50 pack-year smoking history. He has never used smokeless tobacco. He reports that he drinks alcohol. He reports that he does not use drugs.  ROS: A complete review of systems was performed.  All systems are negative except for pertinent findings as noted.  Physical Exam:  Vital signs in last 24 hours:   Constitutional:  Alert and oriented, No acute distress Cardiovascular: Regular rate and rhythm, No JVD Respiratory: Normal respiratory effort, Lungs clear bilaterally GI: Abdomen is soft, nontender, nondistended, no abdominal masses Genitourinary: No CVAT. Normal male phallus, testes are descended bilaterally and non-tender and without masses, scrotum is normal in appearance without lesions or masses, perineum is normal on inspection. Rectal: Normal sphincter tone, no rectal masses, prostate is non tender and without nodularity. Prostate size is estimated to be 30 cc Lymphatic: No  lymphadenopathy Neurologic: Grossly intact, no focal deficits Psychiatric: Normal mood and affect  Laboratory Data:  No results for input(s): WBC, HGB, HCT, PLT in the last 72 hours.  No results for input(s): NA, K, CL, GLUCOSE, BUN, CALCIUM, CREATININE in the last 72 hours.  Invalid input(s): CO3   No results found for this or any previous visit (from the past 24 hour(s)). No results found for this or any previous visit (from the past 240 hour(s)).  Renal Function: No  results for input(s): CREATININE in the last 168 hours. Estimated Creatinine Clearance: 70.1 mL/min (by C-G formula based on SCr of 0.74 mg/dL).  Radiologic Imaging: No results found.  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

## 2017-03-01 NOTE — Anesthesia Procedure Notes (Signed)
Procedure Name: LMA Insertion Date/Time: 03/01/2017 12:45 PM Performed by: Vista Deck Pre-anesthesia Checklist: Patient identified, Patient being monitored, Emergency Drugs available, Timeout performed and Suction available Patient Re-evaluated:Patient Re-evaluated prior to inductionOxygen Delivery Method: Circle System Utilized Preoxygenation: Pre-oxygenation with 100% oxygen Intubation Type: IV induction Ventilation: Mask ventilation without difficulty LMA: LMA inserted LMA Size: 4.0 Number of attempts: 1 Placement Confirmation: positive ETCO2 and breath sounds checked- equal and bilateral Tube secured with: Tape

## 2017-03-01 NOTE — Interval H&P Note (Signed)
History and Physical Interval Note:  03/01/2017 12:31 PM  Shane [name]  has presented today for surgery, with the diagnosis of BLADDER CANCER  The various methods of treatment have been discussed with the patient and family. After consideration of risks, benefits and other options for treatment, the patient has consented to  Procedure(s) with comments: TRANSURETHRAL RESECTION OF BLADDER TUMOR (TURBT) (N/A) - 1 HR [phone] [employer] [id number] as a surgical intervention .  The patient's history has been reviewed, patient examined, no change in status, stable for surgery.  I have reviewed the patient's chart and labs.  Questions were answered to the patient's satisfaction.     Jorja Loa

## 2017-03-01 NOTE — Anesthesia Postprocedure Evaluation (Signed)
Anesthesia Post Note  Patient: Shane [name]  Procedure(s) Performed: Procedure(s) (LRB): TRANSURETHRAL RESECTION OF BLADDER TUMOR (TURBT) (N/A)  Patient location during evaluation: PACU Anesthesia Type: General Level of consciousness: awake Pain management: satisfactory to patient Vital Signs Assessment: post-procedure vital signs reviewed and stable Respiratory status: spontaneous breathing and patient connected to nasal cannula oxygen Cardiovascular status: stable Postop Assessment: no signs of nausea or vomiting Anesthetic complications: no     Last Vitals:  Vitals:   03/01/17 1415 03/01/17 1445  BP: (!) 151/90 (!) 142/75  Pulse: 60 (!) 52  Resp: 13 18  Temp:      Last Pain:  Vitals:   03/01/17 1445  TempSrc:   PainSc: 6                  Charity Tessier

## 2017-03-01 NOTE — Discharge Instructions (Signed)
1.  Leave catheter in until Friday morning.  Then, it can be removed.  2.  Expect your urine to clear, and then in approximately 10-14 days, you may start passing blood.  Again.  That is expected and should clear up quickly.  3.  Do not start your Coumadin until your urine is clear in a few days.  4.  Call the office if you have significant issues, fever, or difficulty urinating.

## 2017-03-02 ENCOUNTER — Encounter (HOSPITAL_COMMUNITY): Payer: Self-pay | Admitting: Urology

## 2017-03-02 DIAGNOSIS — C675 Malignant neoplasm of bladder neck: Secondary | ICD-10-CM | POA: Diagnosis not present

## 2017-03-02 MED ORDER — SULFAMETHOXAZOLE-TRIMETHOPRIM 800-160 MG PO TABS: 1.0000 | ORAL_TABLET | Freq: Two times a day (BID) | ORAL | 0 refills | Status: DC

## 2017-03-02 MED ORDER — OXYCODONE HCL 5 MG PO TABS: 5.0000 mg | ORAL_TABLET | ORAL | 0 refills | Status: DC | PRN

## 2017-03-02 NOTE — Addendum Note (Signed)
Addendum  created 03/02/17 0918 by Vista Deck, CRNA   Sign clinical note

## 2017-03-02 NOTE — Care Management Obs Status (Signed)
[place] NOTIFICATION   Patient Details  Name: Shane [name] MRN: [id number] Date of Birth: 1932-12-16   Medicare Observation Status Notification Given:  Yes    Bralee Feldt, Chauncey Reading, RN 03/02/2017, 11:14 AM

## 2017-03-02 NOTE — Progress Notes (Signed)
Discharge instructions and prescription given, verbalized understanding, out in stable condition with foley to leg bag via w/c with staff.

## 2017-03-02 NOTE — Care Management Note (Signed)
Case Management Note  Patient Details  Name: Shane [name] MRN: [id number] Date of Birth: 1932/10/25  Subjective/Objective: Adm s/p TURBT. From home, ind with ADL's, walks 20-25 miles per week. No HH or DME PTA.                   Action/Plan: Plans to return home with self care at DC. Foley to be removed Friday per patient.    Expected Discharge Date:  03/03/17               Expected Discharge Plan:  Home/Self Care  In-House Referral:     Discharge planning Services  CM Consult  Post Acute Care Choice:    Choice offered to:  NA  DME Arranged:    DME Agency:     HH Arranged:    HH Agency:     Status of Service:  Completed, signed off  If discussed at [employer] of Stay Meetings, dates discussed:    Additional Comments:  Cherice Glennie, Chauncey Reading, RN 03/02/2017, 2:08 PM

## 2017-03-02 NOTE — Anesthesia Postprocedure Evaluation (Signed)
Anesthesia Post Note  Patient: Shane [name]  Procedure(s) Performed: Procedure(s) (LRB): TRANSURETHRAL RESECTION OF BLADDER TUMOR (TURBT) (N/A)  Patient location during evaluation: Nursing Unit Anesthesia Type: General Level of consciousness: awake and alert Pain management: satisfactory to patient Vital Signs Assessment: post-procedure vital signs reviewed and stable Respiratory status: spontaneous breathing and patient connected to nasal cannula oxygen Cardiovascular status: stable Postop Assessment: no signs of nausea or vomiting Anesthetic complications: no     Last Vitals:  Vitals:   03/01/17 2109 03/02/17 0705  BP: 122/60 118/77  Pulse: 70 68  Resp: 16 16  Temp: 36.7 C 36.9 C    Last Pain:  Vitals:   03/02/17 0705  TempSrc: Oral  PainSc:                  Drucie Opitz

## 2017-03-02 NOTE — Addendum Note (Signed)
Addendum  created 03/02/17 0920 by Vista Deck, CRNA   Anesthesia Event edited

## 2017-03-02 NOTE — Addendum Note (Signed)
Addendum  created 03/02/17 0601 by Vista Deck, CRNA   Anesthesia Event edited

## 2017-03-03 ENCOUNTER — Encounter (HOSPITAL_COMMUNITY): Payer: Self-pay | Admitting: Urology

## 2017-03-03 NOTE — Op Note (Addendum)
Preoperative diagnosis: Bladder tumor, large  Postoperative diagnosis: Same, approximately 7 cm  Principal procedure: Cystoscopy, transurethral resection of large (greater than 7 cm) bladder tumor, placement of epirubicin 80 mgintravesically postoperatively  Surgeon: Neely Cecena  Anesthesia: General with LMA   complications:none  Specimen: 1.  Bladder neck tumor 2.  Tumor anterior bladder wall, both to pathology  Drains:22 French-way Foley catheter with 10 cc balloon  Indications: 83 year old male recently presenting with gross hematuria.  Cystoscopy revealed 2 areas of bladder tumors, mainly on the right bladder neck and also on the anterior bladder wall.  The anterior bladder wall tumor was somewhat nodular.  CT scan revealed essentially normal age-related intra-abdominal findings without evidence of metastatic disease.  He presents at this time for TURBT.  Risks and complications of procedure have been discussed with the patient.  He understands these and desires to proceed.  We have also talked about administering epirubicin postoperatively if there is no significant perforation of the bladder.  Findings: Cystoscopy revealed normal ureteral orifices.  2 major areas of tumor involvement.  The first was at the bladder neck extending approximately from the 6:00 posterior region, to the right anteriorly to the bladder neck.  This was approximately 2 cm wide and encompassed the bladder neck for approximately 6-7 cm.  The anterior bladder  tumor was just to the left of the midline and approximately 2 cm in size.  Description of procedure: The patient was properly identified in the holding area. He received preoperative IV antibiotic was taken to the operating room where general anesthetic was administered with the LMA.  He was placed in the dorsolithotomy position.  Genitalia and perineum were prepped and draped.  A timeout was performed.  A 26 French resectoscope sheath was advanced into the  bladder using the visual obturator.urethra was normal, prostate was not significantly obstructive.  Bladder was inspected circumferentially with the above-mentioned findings.  The resectoscope element with cutting loop was in place.  I then started resection at the 6 o'clock position at the bladder neck resecting into the muscle with thecutting loop.  Resection was carried in a rightward and anterior direction, thus resecting the circumferential tumor at the bladder neck.  This was a significant resection due to the size of the tumor.  After resecting the majority of the tumor, small carpets  of papillary tumor just peripheral to the resection site were cauterized.  In this manner, all tumor bladder neck was resected or obliterated. Care was taken to provide excellent hemostasis using the coag current.  Following this, I then turned my attention to the anterior bladder wall tumor.  This was resected down to the muscle layer.  There were 2-3 significant small arterials in this area which were controlled using electrocautery.  Following this, the entire bladder tumor was resected.  I felt that there was muscle present in both resected areas.  The bladder neck and anterior bladder tumors were sent separately for pathology.  At this point, I inspected both anterior bladder wall and the bladder neck areas.  There was adequate hemostasis with no abnormal-appearing urothelium present at this point.  The scope was then removed.  A 22 French two-way Foley catheter was placed. Bladder was drained and the catheter was plugged.  Patient was then awakened and taken to the PACU in stable condition.  In the PACU, 80 mg of epirubicin in 40 cc of diluent were then placed and left indwelling for an hour.  Following this, the epirubicin was drained and the  catheter was hooked up to dependent drainage.  It should be noted that this procedure was performed on 03/01/2017.  Unfortunately, because of Epic issues, I cannot correctly  enter  the date into the system.

## 2017-03-04 ENCOUNTER — Ambulatory Visit (INDEPENDENT_AMBULATORY_CARE_PROVIDER_SITE_OTHER): Payer: Medicare HMO | Admitting: Urology

## 2017-03-04 DIAGNOSIS — C679 Malignant neoplasm of bladder, unspecified: Secondary | ICD-10-CM

## 2017-03-06 NOTE — Op Note (Signed)
Shane Gallo, MD Physician Addendum Urology  Op Note Date of Service: 02/24/2017 2:09 PM  Related encounter: Pre-Admission Testing 60 from 02/24/2017 in [place]       [] Hide copied text [] Hover for attribution information Preoperative diagnosis: Bladder tumor, large  Postoperative diagnosis: Same, approximately 7 cm  Principal procedure: Cystoscopy, transurethral resection of large (greater than 7 cm) bladder tumor, placement of epirubicin 80 mgintravesically postoperatively  Surgeon: Diona Fanti  Anesthesia: General with LMA   complications:none  Specimen: 1.  Bladder neck tumor 2.  Tumor anterior bladder wall, both to pathology  Drains:22 French-way Foley catheter with 10 cc balloon  Indications: 83 year old male recently presenting with gross hematuria.  Cystoscopy revealed 2 areas of bladder tumors, mainly on the right bladder neck and also on the anterior bladder wall.  The anterior bladder wall tumor was somewhat nodular.  CT scan revealed essentially normal age-related intra-abdominal findings without evidence of metastatic disease.  He presents at this time for TURBT.  Risks and complications of procedure have been discussed with the patient.  He understands these and desires to proceed.  We have also talked about administering epirubicin postoperatively if there is no significant perforation of the bladder.  Findings: Cystoscopy revealed normal ureteral orifices.  2 major areas of tumor involvement.  The first was at the bladder neck extending approximately from the 6:00 posterior region, to the right anteriorly to the bladder neck.  This was approximately 2 cm wide and encompassed the bladder neck for approximately 6-7 cm.  The anterior bladder  tumor was just to the left of the midline and approximately 2 cm in size.  Description of procedure: The patient was properly identified in the holding area. He received preoperative IV antibiotic was  taken to the operating room where general anesthetic was administered with the LMA.  He was placed in the dorsolithotomy position.  Genitalia and perineum were prepped and draped.  A timeout was performed.  A 26 French resectoscope sheath was advanced into the bladder using the visual obturator.urethra was normal, prostate was not significantly obstructive.  Bladder was inspected circumferentially with the above-mentioned findings.  The resectoscope element with cutting loop was in place.  I then started resection at the 6 o'clock position at the bladder neck resecting into the muscle with thecutting loop.  Resection was carried in a rightward and anterior direction, thus resecting the circumferential tumor at the bladder neck.  This was a significant resection due to the size of the tumor.  After resecting the majority of the tumor, small carpets  of papillary tumor just peripheral to the resection site were cauterized.  In this manner, all tumor bladder neck was resected or obliterated. Care was taken to provide excellent hemostasis using the coag current.  Following this, I then turned my attention to the anterior bladder wall tumor.  This was resected down to the muscle layer.  There were 2-3 significant small arterials in this area which were controlled using electrocautery.  Following this, the entire bladder tumor was resected.  I felt that there was muscle present in both resected areas.  The bladder neck and anterior bladder tumors were sent separately for pathology.  At this point, I inspected both anterior bladder wall and the bladder neck areas.  There was adequate hemostasis with no abnormal-appearing urothelium present at this point.  The scope was then removed.  A 22 French two-way Foley catheter was placed. Bladder was drained and the catheter was plugged.  Patient was  then awakened and taken to the PACU in stable condition.  In the PACU, 80 mg of epirubicin in 40 cc of diluent were  then placed and left indwelling for an hour.  Following this, the epirubicin was drained and the catheter was hooked up to dependent drainage.  It should be noted that this procedure was performed on 03/01/2017.  Unfortunately, because of Epic issues, I cannot correctly enter  the date into the system.    Revision History                             Routing History

## 2017-03-14 ENCOUNTER — Ambulatory Visit (INDEPENDENT_AMBULATORY_CARE_PROVIDER_SITE_OTHER): Payer: Medicare HMO | Admitting: *Deleted

## 2017-03-14 DIAGNOSIS — I4892 Unspecified atrial flutter: Secondary | ICD-10-CM | POA: Diagnosis not present

## 2017-03-14 DIAGNOSIS — Z7901 Long term (current) use of anticoagulants: Secondary | ICD-10-CM | POA: Diagnosis not present

## 2017-03-14 DIAGNOSIS — Z5181 Encounter for therapeutic drug level monitoring: Secondary | ICD-10-CM | POA: Diagnosis not present

## 2017-03-14 LAB — POCT INR: INR: 2.1

## 2017-03-27 NOTE — Discharge Summary (Signed)
Physician Discharge Summary   Patient ID: Shane [name] [id number] 83 y.o. 1933/03/04  Admit date: 03/01/2017  Discharge date and time: 03/02/2017  3:45 PM   Admitting Physician: Franchot Gallo, MD   Discharge Physician: Nicolette Bang, MD  Admission Diagnoses: BLADDER CANCER  Discharge Diagnoses: Bladder Cancer  Admission Condition: good  Discharged Condition: good  Indication for Admission: hematuria  Hospital Course: The patient tolerated the procedure well and was transferred to the floor on IV pain meds, IV fluid. On POD#1 pt was started regular diet and they ambulated in the halls.  Prior to discharge the pt was tolerating a regular diet, pain was controlled on PO pain meds, they were ambulating without difficulty, and they had normal bowel function.  Consults: None  Significant Diagnostic Studies: none  Treatments: surgery: TURBT  Discharge Exam: BP 118/77 (BP Location: Left Arm)   Pulse 68   Temp 98.4 F (36.9 C) (Oral)   Resp 16   Ht 5\' 8"  (1.727 m)   Wt 80.7 kg (178 lb)   SpO2 94%   BMI 27.06 kg/m   General Appearance:    Alert, cooperative, no distress, appears stated age  Head:    Normocephalic, without obvious abnormality, atraumatic  Eyes:    PERRL, conjunctiva/corneas clear, EOM's intact, fundi    benign, both eyes       Ears:    Normal TM's and external ear canals, both ears  Nose:   Nares normal, septum midline, mucosa normal, no drainage    or sinus tenderness  Throat:   Lips, mucosa, and tongue normal; teeth and gums normal  Neck:   Supple, symmetrical, trachea midline, no adenopathy;       thyroid:  No enlargement/tenderness/nodules; no carotid   bruit or JVD  Back:     Symmetric, no curvature, ROM normal, no CVA tenderness  Lungs:     Clear to auscultation bilaterally, respirations unlabored  Chest wall:    No tenderness or deformity  Heart:    Regular rate and rhythm, S1 and S2 normal, no murmur, rub   or gallop  Abdomen:     Soft,  non-tender, bowel sounds active all four quadrants,    no masses, no organomegaly  Genitalia:    Normal male without lesion, discharge or tenderness  Rectal:    Normal tone, normal prostate, no masses or tenderness;   guaiac negative stool  Extremities:   Extremities normal, atraumatic, no cyanosis or edema  Pulses:   2+ and symmetric all extremities  Skin:   Skin color, texture, turgor normal, no rashes or lesions  Lymph nodes:   Cervical, supraclavicular, and axillary nodes normal  Neurologic:   CNII-XII intact. Normal strength, sensation and reflexes      throughout    Disposition: 01-Home or Self Care  Patient Instructions:  Allergies as of 03/02/2017   No Known Allergies     Medication List    STOP taking these medications   warfarin 5 MG tablet Commonly known as:  COUMADIN     TAKE these medications   atorvastatin 80 MG tablet Commonly known as:  LIPITOR Take 1 tablet (80 mg total) by mouth daily. What changed:  when to take this   FLOMAX 0.4 MG Caps capsule Generic drug:  tamsulosin Take 0.4 mg by mouth daily.   oxyCODONE 5 MG immediate release tablet Commonly known as:  Oxy IR/ROXICODONE Take 1 tablet (5 mg total) by mouth every 4 (four) hours as needed for moderate pain.  sulfamethoxazole-trimethoprim 800-160 MG tablet Commonly known as:  BACTRIM DS,SEPTRA DS Take 1 tablet by mouth 2 (two) times daily.      Activity: activity as tolerated Diet: regular diet Wound Care: none needed  Follow-up with Dr. Diona Fanti in 1 week.  SignedNicolette Bang 03/27/2017 8:14 AM

## 2017-04-04 ENCOUNTER — Ambulatory Visit (INDEPENDENT_AMBULATORY_CARE_PROVIDER_SITE_OTHER): Payer: Medicare HMO | Admitting: *Deleted

## 2017-04-04 DIAGNOSIS — Z7901 Long term (current) use of anticoagulants: Secondary | ICD-10-CM | POA: Diagnosis not present

## 2017-04-04 DIAGNOSIS — I4892 Unspecified atrial flutter: Secondary | ICD-10-CM | POA: Diagnosis not present

## 2017-04-04 DIAGNOSIS — Z5181 Encounter for therapeutic drug level monitoring: Secondary | ICD-10-CM | POA: Diagnosis not present

## 2017-04-04 LAB — POCT INR: INR: 2.6

## 2017-04-05 ENCOUNTER — Ambulatory Visit (INDEPENDENT_AMBULATORY_CARE_PROVIDER_SITE_OTHER): Payer: Medicare HMO | Admitting: Urology

## 2017-04-05 DIAGNOSIS — C678 Malignant neoplasm of overlapping sites of bladder: Secondary | ICD-10-CM

## 2017-04-06 ENCOUNTER — Other Ambulatory Visit: Payer: Self-pay | Admitting: Urology

## 2017-04-07 ENCOUNTER — Encounter (HOSPITAL_COMMUNITY): Payer: Self-pay

## 2017-04-07 ENCOUNTER — Encounter (HOSPITAL_COMMUNITY)
Admission: RE | Admit: 2017-04-07 | Discharge: 2017-04-07 | Disposition: A | Discharge status: Home / Self Care | Payer: Medicare HMO | Source: Ambulatory Visit | Attending: Urology | Admitting: Urology

## 2017-04-07 DIAGNOSIS — C679 Malignant neoplasm of bladder, unspecified: Secondary | ICD-10-CM | POA: Diagnosis not present

## 2017-04-07 LAB — BASIC METABOLIC PANEL
Anion gap: 7 (ref 5–15)
BUN: 17 mg/dL (ref 6–20)
CALCIUM: 9.1 mg/dL (ref 8.9–10.3)
CHLORIDE: 105 mmol/L (ref 101–111)
CO2: 26 mmol/L (ref 22–32)
CREATININE: 0.6 mg/dL — AB (ref 0.61–1.24)
GFR calc Af Amer: >60 mL/min (ref >60)
GFR calc non Af Amer: >60 mL/min (ref >60)
Glucose, Bld: 98 mg/dL (ref 65–99)
Potassium: 4.4 mmol/L (ref 3.5–5.1)
SODIUM: 138 mmol/L (ref 135–145)

## 2017-04-07 LAB — CBC
HCT: 37.6 % — ABNORMAL LOW (ref 39.0–52.0)
HEMOGLOBIN: 11.9 g/dL — AB (ref 13.0–17.0)
MCH: 29.1 pg (ref 26.0–34.0)
MCHC: 31.6 g/dL (ref 30.0–36.0)
MCV: 91.9 fL (ref 78.0–100.0)
PLATELETS: 223 10*3/uL (ref 150–400)
RBC: 4.09 MIL/uL — ABNORMAL LOW (ref 4.22–5.81)
RDW: 14.6 % (ref 11.5–15.5)
WBC: 4.9 10*3/uL (ref 4.0–10.5)

## 2017-04-07 NOTE — Patient Instructions (Signed)
Shane [name]  04/07/2017     @PREFPERIOPPHARMACY @   Your procedure is scheduled on 04/12/2017.  Report to [facility] at 6:15 A.M.  Call this number if you have problems the morning of surgery:  [phone]   Remember:  Do not eat food or drink liquids after midnight.  Take these medicines the morning of surgery with A SIP OF WATER OXY IR if needed, Flomax and follow instructions given to you by Dr Diona Fanti regarding Coumadin   Do not wear jewelry, make-up or nail polish.  Do not wear lotions, powders, or perfumes, or deoderant.  Do not shave 48 hours prior to surgery.  Men may shave face and neck.  Do not bring valuables to the hospital.  [facility] is not responsible for any belongings or valuables.  Contacts, dentures or bridgework may not be worn into surgery.  Leave your suitcase in the car.  After surgery it may be brought to your room.  For patients admitted to the hospital, discharge time will be determined by your treatment team.  Patients discharged the day of surgery will not be allowed to drive home.    Please read over the following fact sheets that you were given. Surgical Site Infection Prevention and Anesthesia Post-op Instructions      Transurethral Resection of Bladder Tumor Transurethral resection of a bladder tumor is the removal (resection) of a cancerous growth (tumor) on the inside wall of the bladder. The bladder is the organ that holds urine. The tumor is removed through the tube that carries urine out of the body (urethra). In a transurethral resection, a thin telescope with a light, a tiny camera, and an electric cutting edge (resectoscope) is passed through the urethra. In men, the opening of the urethra is at the end of the penis. In women, it is just above the vaginal opening. Tell a health care provider about:  Any allergies you have.  All medicines you are taking, including vitamins, herbs, eye drops, creams, and over-the-counter  medicines.  Any problems you or family members have had with anesthetic medicines.  Any blood disorders you have.  Any surgeries you have had.  Any medical conditions you have.  Any recent urinary tract infections you have had.  Whether you are pregnant or may be pregnant. What are the risks? Generally, this is a safe procedure. However, problems may occur, including:  Infection.  Bleeding.  Allergic reactions to medicines.  Damage to other structures or organs, such as: ? The urethra. ? The tubes that drain urine from the kidneys into the bladder (ureters).  Pain and burning during urination.  Difficulty urinating due to partial blockage of the urethra.  Inability to urinate (urinary retention).  What happens before the procedure?  Follow instructions from your health care provider about eating and drinking restrictions.  Ask your health care provider about: ? Changing or stopping your regular medicines. This is especially important if you are taking diabetes medicines or blood thinners. ? Taking medicines such as aspirin and ibuprofen. These medicines can thin your blood. Do not take these medicines before your procedure if your health care provider instructs you not to.  You may have a physical exam.  You may have tests, including: ? Blood tests. ? Urine tests. ? Electrocardiogram (ECG). This test measures the electrical activity of the heart.  You may be given antibiotic medicine to help prevent infection.  Ask your health care provider how your surgical site will be marked or  identified.  Plan to have someone take you home after the procedure. What happens during the procedure?  To reduce your risk of infection: ? Your health care team will wash or sanitize their hands. ? Your skin will be washed with soap.  An IV tube will be inserted into one of your veins.  You will be given one or more of the following: ? A medicine to help you relax  (sedative). ? A medicine to make you fall asleep (general anesthetic). ? A medicine that is injected into your spine to numb the area below and slightly above the injection site (spinal anesthetic).  Your legs will be placed in foot rests (stirrups) so that your legs are apart and your knees are bent.  The resectoscope will be passed through your urethra and into your bladder.  The part of your bladder that is affected by the tumor will be resected using the cutting edge of the resectoscope.  The resectoscope will be removed.  A thin, flexible tube (catheter) will be passed through your urethra and into your bladder. The catheter will drain urine into a bag outside of your body. ? Fluid may be passed through the catheter to keep the catheter open. The procedure may vary among health care providers and hospitals. What happens after the procedure?  Your blood pressure, heart rate, breathing rate, and blood oxygen level will be monitored often until the medicines you were given have worn off.  You may continue to receive fluids and medicines through an IV tube.  You will have some pain. Pain medicine will be available to help you.  You will have a catheter draining your urine. ? You will have blood in your urine. Your catheter may be kept in until your urine is clear. ? Your urinary drainage will be monitored. If necessary, your bladder may be rinsed out (irrigated) by passing fluid through your catheter.  You will be encouraged to walk around as soon as possible.  You may have to wear compression stockings. These stockings help to prevent blood clots and reduce swelling in your legs.  Do not drive for 24 hours if you received a sedative. This information is not intended to replace advice given to you by your health care provider. Make sure you discuss any questions you have with your health care provider. Document Released: 06/19/2009 Document Revised: 04/25/2016 Document Reviewed:  05/15/2015 Elsevier Interactive Patient Education  2018 [facility] [place] POST-ANESTHESIA  IMMEDIATELY FOLLOWING SURGERY:  Do not drive or operate machinery for the first twenty four hours after surgery.  Do not make any important decisions for twenty four hours after surgery or while taking narcotic pain medications or sedatives.  If you develop intractable nausea and vomiting or a severe headache please notify your doctor immediately.  FOLLOW-UP:  Please make an appointment with your surgeon as instructed. You do not need to follow up with anesthesia unless specifically instructed to do so.  WOUND CARE INSTRUCTIONS (if applicable):  Keep a dry clean dressing on the anesthesia/puncture wound site if there is drainage.  Once the wound has quit draining you may leave it open to air.  Generally you should leave the bandage intact for twenty four hours unless there is drainage.  If the epidural site drains for more than 36-48 hours please call the anesthesia department.  QUESTIONS?:  Please feel free to call your physician or the hospital operator if you have any questions, and they will be happy to assist you.  Transurethral Resection of Bladder Tumor Transurethral resection of a bladder tumor is the removal (resection) of a cancerous growth (tumor) on the inside wall of the bladder. The bladder is the organ that holds urine. The tumor is removed through the tube that carries urine out of the body (urethra). In a transurethral resection, a thin telescope with a light, a tiny camera, and an electric cutting edge (resectoscope) is passed through the urethra. In men, the opening of the urethra is at the end of the penis. In women, it is just above the vaginal opening. Tell a health care provider about:  Any allergies you have.  All medicines you are taking, including vitamins, herbs, eye drops, creams, and over-the-counter medicines.  Any problems you or family members have  had with anesthetic medicines.  Any blood disorders you have.  Any surgeries you have had.  Any medical conditions you have.  Any recent urinary tract infections you have had.  Whether you are pregnant or may be pregnant. What are the risks? Generally, this is a safe procedure. However, problems may occur, including:  Infection.  Bleeding.  Allergic reactions to medicines.  Damage to other structures or organs, such as: ? The urethra. ? The tubes that drain urine from the kidneys into the bladder (ureters).  Pain and burning during urination.  Difficulty urinating due to partial blockage of the urethra.  Inability to urinate (urinary retention).  What happens before the procedure?  Follow instructions from your health care provider about eating and drinking restrictions.  Ask your health care provider about: ? Changing or stopping your regular medicines. This is especially important if you are taking diabetes medicines or blood thinners. ? Taking medicines such as aspirin and ibuprofen. These medicines can thin your blood. Do not take these medicines before your procedure if your health care provider instructs you not to.  You may have a physical exam.  You may have tests, including: ? Blood tests. ? Urine tests. ? Electrocardiogram (ECG). This test measures the electrical activity of the heart.  You may be given antibiotic medicine to help prevent infection.  Ask your health care provider how your surgical site will be marked or identified.  Plan to have someone take you home after the procedure. What happens during the procedure?  To reduce your risk of infection: ? Your health care team will wash or sanitize their hands. ? Your skin will be washed with soap.  An IV tube will be inserted into one of your veins.  You will be given one or more of the following: ? A medicine to help you relax (sedative). ? A medicine to make you fall asleep (general  anesthetic). ? A medicine that is injected into your spine to numb the area below and slightly above the injection site (spinal anesthetic).  Your legs will be placed in foot rests (stirrups) so that your legs are apart and your knees are bent.  The resectoscope will be passed through your urethra and into your bladder.  The part of your bladder that is affected by the tumor will be resected using the cutting edge of the resectoscope.  The resectoscope will be removed.  A thin, flexible tube (catheter) will be passed through your urethra and into your bladder. The catheter will drain urine into a bag outside of your body. ? Fluid may be passed through the catheter to keep the catheter open. The procedure may vary among health care providers and hospitals. What happens after the  procedure?  Your blood pressure, heart rate, breathing rate, and blood oxygen level will be monitored often until the medicines you were given have worn off.  You may continue to receive fluids and medicines through an IV tube.  You will have some pain. Pain medicine will be available to help you.  You will have a catheter draining your urine. ? You will have blood in your urine. Your catheter may be kept in until your urine is clear. ? Your urinary drainage will be monitored. If necessary, your bladder may be rinsed out (irrigated) by passing fluid through your catheter.  You will be encouraged to walk around as soon as possible.  You may have to wear compression stockings. These stockings help to prevent blood clots and reduce swelling in your legs.  Do not drive for 24 hours if you received a sedative. This information is not intended to replace advice given to you by your health care provider. Make sure you discuss any questions you have with your health care provider. Document Released: 06/19/2009 Document Revised: 04/25/2016 Document Reviewed: 05/15/2015 Elsevier Interactive Patient Education  2018  [employer].

## 2017-04-08 ENCOUNTER — Inpatient Hospital Stay (HOSPITAL_COMMUNITY)
Admission: RE | Admit: 2017-04-08 | Discharge: 2017-04-08 | Disposition: A | Discharge status: Home / Self Care | Payer: Medicare HMO | Source: Ambulatory Visit

## 2017-04-11 MED ORDER — CEFAZOLIN SODIUM-DEXTROSE 2-4 GM/100ML-% IV SOLN
2.0000 g | INTRAVENOUS | Status: AC
  Administered 2017-04-12: 2 g via INTRAVENOUS
  Filled 2017-04-11: qty 100

## 2017-04-11 NOTE — H&P (Signed)
H&P  Chief Complaint: Bladder cancer  History of Present Illness: 82 year old male presents for repeat TURBT. He initially underwent 1st TURBT on 03/01/2017, with pathology revealing large volume high grade NMIBC. He presents for repeat resection to confirm pathology as well as to assure a cancer free state in advance of probably immunotherapy with BCG induction.  Past Medical History:  Diagnosis Date  . Arteriosclerotic cardiovascular disease (ASCVD)    CABG surgery in 1990. MI in 1986; coronary angiography in 2003-critical LAD with patent LIMA; total obstruction of the RCA with patent RIMA; low normal EF.  [name] Atrial flutter, paroxysmal ([place])    asymptomatic; onset in 2010; 4:1 AVB with low-dose metoprolol; moderate left      atrial enlargement and mild left ventricular hypertrophy with normal ejection fraction by echocardiography  . Benign prostatic hypertrophy   . Bradycardia    a. requiring cessation of beta blocker 12/2016.  . Cancer ([place])   . Chronic anticoagulation   . Hyperlipidemia    Lipid profile in 09/2010:196, 84, 54, 125  . Mobitz type 1 second degree atrioventricular block 12/17/2016  . Nephrolithiasis   . Polymyalgia rheumatica ([facility])     Past Surgical History:  Procedure Laterality Date  . COLONOSCOPY  08/21/01   RMR: Left-sided diverticulum.  Remainder of colonic mucosa appeared normal  . COLONOSCOPY N/A 07/31/2013   Procedure: COLONOSCOPY;  Surgeon: Danie Binder, MD;  Location: [facility];  Service: Endoscopy;  Laterality: N/A;  10:30  . CORONARY ARTERY BYPASS GRAFT  1990  . LUMBAR DISC SURGERY    . TRANSURETHRAL RESECTION OF BLADDER TUMOR N/A 03/01/2017   Procedure: TRANSURETHRAL RESECTION OF BLADDER TUMOR (TURBT);  Surgeon: Franchot Gallo, MD;  Location: [facility] ORS;  Service: Urology;  Laterality: N/A;    Home Medications:    Allergies: No Known Allergies  Family History  Problem Relation Age of Onset  . Heart attack Mother   . Coronary artery disease  Brother        CABG + pacemaker  . Pneumonia Brother        infant death  . Colon cancer Neg Hx   . Colon polyps Neg Hx     Social History:  reports that he quit smoking about 34 years ago. He has a 37.50 pack-year smoking history. He has never used smokeless tobacco. He reports that he drinks alcohol. He reports that he does not use drugs.  ROS: A complete review of systems was performed.  All systems are negative except for pertinent findings as noted.  Physical Exam:  Vital signs in last 24 hours:   Constitutional:  Alert and oriented, No acute distress Cardiovascular: Regular rate and rhythm, No JVD Respiratory: Normal respiratory effort, Lungs clear bilaterally GI: Abdomen is soft, nontender, nondistended, no abdominal masses Genitourinary: No CVAT. Normal male phallus, testes are descended bilaterally and non-tender and without masses, scrotum is normal in appearance without lesions or masses, perineum is normal on inspection. Psychiatric: Normal mood and affect  Laboratory Data:  No results for input(s): WBC, HGB, HCT, PLT in the last 72 hours.  No results for input(s): NA, K, CL, GLUCOSE, BUN, CALCIUM, CREATININE in the last 72 hours.  Invalid input(s): CO3   No results found for this or any previous visit (from the past 24 hour(s)). No results found for this or any previous visit (from the past 240 hour(s)).  Renal Function:  Recent Labs  04/07/17 1155  CREATININE 0.60*   CrCl cannot be calculated (  Unknown ideal weight.).  Radiologic Imaging: No results found.  Impression/Assessment:  Large volume high grade NMIBC, s/p initial TURBT 03/01/2017  Plan:  Repeat TURBT

## 2017-04-12 ENCOUNTER — Encounter (HOSPITAL_COMMUNITY)
Admission: RE | Disposition: A | Discharge status: Home / Self Care | Payer: Self-pay | Source: Ambulatory Visit | Attending: Urology

## 2017-04-12 ENCOUNTER — Ambulatory Visit (HOSPITAL_COMMUNITY): Payer: Medicare HMO | Admitting: Anesthesiology

## 2017-04-12 ENCOUNTER — Ambulatory Visit (HOSPITAL_COMMUNITY)
Admission: RE | Admit: 2017-04-12 | Discharge: 2017-04-12 | Disposition: A | Discharge status: Home / Self Care | Payer: Medicare HMO | Source: Ambulatory Visit | Attending: Urology | Admitting: Urology

## 2017-04-12 ENCOUNTER — Encounter (HOSPITAL_COMMUNITY): Payer: Self-pay

## 2017-04-12 DIAGNOSIS — Z951 Presence of aortocoronary bypass graft: Secondary | ICD-10-CM | POA: Diagnosis not present

## 2017-04-12 DIAGNOSIS — I252 Old myocardial infarction: Secondary | ICD-10-CM | POA: Insufficient documentation

## 2017-04-12 DIAGNOSIS — I4892 Unspecified atrial flutter: Secondary | ICD-10-CM | POA: Insufficient documentation

## 2017-04-12 DIAGNOSIS — Z87891 Personal history of nicotine dependence: Secondary | ICD-10-CM | POA: Insufficient documentation

## 2017-04-12 DIAGNOSIS — Z79899 Other long term (current) drug therapy: Secondary | ICD-10-CM | POA: Insufficient documentation

## 2017-04-12 DIAGNOSIS — I517 Cardiomegaly: Secondary | ICD-10-CM | POA: Insufficient documentation

## 2017-04-12 DIAGNOSIS — D494 Neoplasm of unspecified behavior of bladder: Secondary | ICD-10-CM | POA: Diagnosis not present

## 2017-04-12 DIAGNOSIS — E785 Hyperlipidemia, unspecified: Secondary | ICD-10-CM | POA: Insufficient documentation

## 2017-04-12 DIAGNOSIS — Z8249 Family history of ischemic heart disease and other diseases of the circulatory system: Secondary | ICD-10-CM | POA: Diagnosis not present

## 2017-04-12 DIAGNOSIS — C679 Malignant neoplasm of bladder, unspecified: Secondary | ICD-10-CM | POA: Insufficient documentation

## 2017-04-12 DIAGNOSIS — N400 Enlarged prostate without lower urinary tract symptoms: Secondary | ICD-10-CM | POA: Diagnosis not present

## 2017-04-12 DIAGNOSIS — N3000 Acute cystitis without hematuria: Secondary | ICD-10-CM | POA: Diagnosis not present

## 2017-04-12 DIAGNOSIS — I2510 Atherosclerotic heart disease of native coronary artery without angina pectoris: Secondary | ICD-10-CM | POA: Insufficient documentation

## 2017-04-12 DIAGNOSIS — Z7901 Long term (current) use of anticoagulants: Secondary | ICD-10-CM | POA: Diagnosis not present

## 2017-04-12 DIAGNOSIS — Z9889 Other specified postprocedural states: Secondary | ICD-10-CM | POA: Insufficient documentation

## 2017-04-12 DIAGNOSIS — N3010 Interstitial cystitis (chronic) without hematuria: Secondary | ICD-10-CM | POA: Diagnosis not present

## 2017-04-12 DIAGNOSIS — M353 Polymyalgia rheumatica: Secondary | ICD-10-CM | POA: Insufficient documentation

## 2017-04-12 DIAGNOSIS — C678 Malignant neoplasm of overlapping sites of bladder: Secondary | ICD-10-CM | POA: Diagnosis not present

## 2017-04-12 HISTORY — PX: TRANSURETHRAL RESECTION OF BLADDER TUMOR: SHX2575

## 2017-04-12 SURGERY — TURBT (TRANSURETHRAL RESECTION OF BLADDER TUMOR)
Anesthesia: General

## 2017-04-12 MED ORDER — PROPOFOL 10 MG/ML IV BOLUS
INTRAVENOUS | Status: DC | PRN
  Administered 2017-04-12: 20 mg via INTRAVENOUS
  Administered 2017-04-12: 100 mg via INTRAVENOUS
  Administered 2017-04-12: 50 mg via INTRAVENOUS

## 2017-04-12 MED ORDER — LACTATED RINGERS IV SOLN
INTRAVENOUS | Status: DC
  Administered 2017-04-12: 07:00:00 via INTRAVENOUS

## 2017-04-12 MED ORDER — FENTANYL CITRATE (PF) 100 MCG/2ML IJ SOLN: 25.0000 ug | INTRAMUSCULAR | Status: DC | PRN

## 2017-04-12 MED ORDER — FENTANYL CITRATE (PF) 100 MCG/2ML IJ SOLN
INTRAMUSCULAR | Status: DC | PRN
  Administered 2017-04-12 (×4): 25 ug via INTRAVENOUS

## 2017-04-12 MED ORDER — SODIUM CHLORIDE 0.9 % IR SOLN
Status: DC | PRN
  Administered 2017-04-12: 3000 mL

## 2017-04-12 MED ORDER — ATROPINE SULFATE 0.4 MG/ML IJ SOLN
0.4000 mg | Freq: Once | INTRAMUSCULAR | Status: AC
  Administered 2017-04-12: 0.4 mg via INTRAVENOUS
  Filled 2017-04-12: qty 1

## 2017-04-12 MED ORDER — ATROPINE SULFATE 1 MG/ML IJ SOLN
INTRAMUSCULAR | Status: AC
  Filled 2017-04-12: qty 1

## 2017-04-12 MED ORDER — FENTANYL CITRATE (PF) 100 MCG/2ML IJ SOLN
INTRAMUSCULAR | Status: AC
  Filled 2017-04-12: qty 2

## 2017-04-12 MED ORDER — ATROPINE SULFATE 0.4 MG/ML IJ SOLN
INTRAMUSCULAR | Status: AC
  Filled 2017-04-12: qty 1

## 2017-04-12 MED ORDER — STERILE WATER FOR IRRIGATION IR SOLN
Status: DC | PRN
  Administered 2017-04-12: 1000 mL

## 2017-04-12 MED ORDER — PROPOFOL 10 MG/ML IV BOLUS
INTRAVENOUS | Status: AC
  Filled 2017-04-12: qty 20

## 2017-04-12 MED ORDER — SODIUM CHLORIDE 0.9 % IJ SOLN
INTRAMUSCULAR | Status: AC
  Filled 2017-04-12: qty 10

## 2017-04-12 MED ORDER — MIDAZOLAM HCL 2 MG/2ML IJ SOLN
INTRAMUSCULAR | Status: AC
  Filled 2017-04-12: qty 2

## 2017-04-12 MED ORDER — LIDOCAINE HCL (CARDIAC) 10 MG/ML IV SOLN
INTRAVENOUS | Status: DC | PRN
  Administered 2017-04-12: 30 mg via INTRAVENOUS

## 2017-04-12 MED ORDER — CEFAZOLIN SODIUM-DEXTROSE 2-4 GM/100ML-% IV SOLN: 2.0000 g | Freq: Once | INTRAVENOUS | Status: DC

## 2017-04-12 MED ORDER — EPHEDRINE SULFATE 50 MG/ML IJ SOLN
INTRAMUSCULAR | Status: AC
  Filled 2017-04-12: qty 2

## 2017-04-12 MED ORDER — MIDAZOLAM HCL 2 MG/2ML IJ SOLN
1.0000 mg | INTRAMUSCULAR | Status: AC
  Administered 2017-04-12: 1 mg via INTRAVENOUS

## 2017-04-12 MED ORDER — TRAMADOL HCL 50 MG PO TABS: 50.0000 mg | ORAL_TABLET | Freq: Four times a day (QID) | ORAL | 0 refills | Status: DC | PRN

## 2017-04-12 SURGICAL SUPPLY — 28 items
BAG DRAIN URO TABLE W/ADPT NS (DRAPE) ×2 IMPLANT
BAG DRN 8 ADPR NS SKTRN CSTL (DRAPE) ×1
BAG HAMPER (MISCELLANEOUS) ×2 IMPLANT
BAG URINE DRAINAGE (UROLOGICAL SUPPLIES) IMPLANT
BAG URINE LEG 500ML (DRAIN) ×2 IMPLANT
CATH FOLEY 2WAY SLVR  5CC 20FR (CATHETERS)
CATH FOLEY 2WAY SLVR 5CC 20FR (CATHETERS) IMPLANT
CLOTH BEACON ORANGE TIMEOUT ST (SAFETY) ×2 IMPLANT
ELECT LOOP 22F BIPOLAR SML (ELECTROSURGICAL) ×2
ELECT REM PT RETURN 9FT ADLT (ELECTROSURGICAL) ×2
ELECTRODE LOOP 22F BIPOLAR SML (ELECTROSURGICAL) ×1 IMPLANT
ELECTRODE REM PT RTRN 9FT ADLT (ELECTROSURGICAL) ×1 IMPLANT
EVACUATOR MICROVAS BLADDER (UROLOGICAL SUPPLIES) IMPLANT
GLOVE BIO SURGEON STRL SZ8 (GLOVE) ×3 IMPLANT
GLOVE BIOGEL PI IND STRL 7.0 (GLOVE) ×1 IMPLANT
GLOVE BIOGEL PI INDICATOR 7.0 (GLOVE) ×1
GOWN STRL REUS W/TWL LRG LVL3 (GOWN DISPOSABLE) ×2 IMPLANT
GOWN STRL REUS W/TWL XL LVL3 (GOWN DISPOSABLE) ×2 IMPLANT
IV NS IRRIG 3000ML ARTHROMATIC (IV SOLUTION) ×2 IMPLANT
KIT CHEMO SPILL (MISCELLANEOUS) IMPLANT
KIT ROOM TURNOVER AP CYSTO (KITS) ×2 IMPLANT
MANIFOLD NEPTUNE II (INSTRUMENTS) ×2 IMPLANT
PACK CYSTO (CUSTOM PROCEDURE TRAY) ×2 IMPLANT
PAD ARMBOARD 7.5X6 YLW CONV (MISCELLANEOUS) ×2 IMPLANT
PLUG CATH AND CAP STER (CATHETERS) IMPLANT
SYR 10ML LL (SYRINGE) ×2 IMPLANT
SYRINGE IRR TOOMEY STRL 70CC (SYRINGE) ×4 IMPLANT
WATER STERILE IRR 1000ML POUR (IV SOLUTION) ×2 IMPLANT

## 2017-04-12 NOTE — Transfer of Care (Signed)
Immediate Anesthesia Transfer of Care Note  Patient: Shane [name]  Procedure(s) Performed: Procedure(s) with comments: REPEAT TRANSURETHRAL RESECTION OF BLADDER TUMOR (TURBT) (N/A) - 1 HR [phone] [employer] [place]  Patient Location: PACU  Anesthesia Type:General  Level of Consciousness: awake and patient cooperative  Airway & Oxygen Therapy: Patient Spontanous Breathing and non-rebreather face mask  Post-op Assessment: Report given to RN and Post -op Vital signs reviewed and stable  Post vital signs: Reviewed and stable  Last Vitals:  Vitals:   04/12/17 0635 04/12/17 0640  BP: 112/70 (!) 149/72  Pulse:    Resp: 15 11  Temp:      Last Pain:  Vitals:   04/12/17 0627  TempSrc: Oral         Complications: No apparent anesthesia complications

## 2017-04-12 NOTE — Anesthesia Procedure Notes (Signed)
Procedure Name: LMA Insertion Date/Time: 04/12/2017 7:37 AM Performed by: Vista Deck Pre-anesthesia Checklist: Patient identified, Patient being monitored, Emergency Drugs available, Timeout performed and Suction available Patient Re-evaluated:Patient Re-evaluated prior to induction Oxygen Delivery Method: Circle System Utilized Preoxygenation: Pre-oxygenation with 100% oxygen Induction Type: IV induction Ventilation: Mask ventilation without difficulty LMA: LMA inserted LMA Size: 4.0 Number of attempts: 1 Placement Confirmation: positive ETCO2 and breath sounds checked- equal and bilateral Tube secured with: Tape Dental Injury: Teeth and Oropharynx as per pre-operative assessment

## 2017-04-12 NOTE — Anesthesia Preprocedure Evaluation (Signed)
Anesthesia Evaluation  Patient identified by MRN, date of birth, ID band Patient awake    Airway Mallampati: I  TM Distance: >3 FB Neck ROM: Full    Dental  (+) Teeth Intact   Pulmonary former smoker,    Pulmonary exam normal        Cardiovascular hypertension, + dysrhythmias Atrial Fibrillation  Rhythm:Regular Rate:Abnormal  Dropped beat, regularly irregular   Neuro/Psych negative neurological ROS  negative psych ROS   GI/Hepatic negative GI ROS, Neg liver ROS,   Endo/Other  negative endocrine ROS  Renal/GU Renal disease     Musculoskeletal negative musculoskeletal ROS (+)   Abdominal Normal abdominal exam  (+)   Peds negative pediatric ROS (+)  Hematology negative hematology ROS (+)   Anesthesia Other Findings   Reproductive/Obstetrics negative OB ROS                             Anesthesia Physical Anesthesia Plan  ASA: III  Anesthesia Plan: General   Post-op Pain Management:    Induction: Intravenous  PONV Risk Score and Plan:   Airway Management Planned: LMA  Additional Equipment:   Intra-op Plan:   Post-operative Plan: Extubation in OR  Informed Consent: I have reviewed the patients History and Physical, chart, labs and discussed the procedure including the risks, benefits and alternatives for the proposed anesthesia with the patient or authorized representative who has indicated his/her understanding and acceptance.     Plan Discussed with:   Anesthesia Plan Comments:         Anesthesia Quick Evaluation

## 2017-04-12 NOTE — Interval H&P Note (Signed)
History and Physical Interval Note:  04/12/2017 7:18 AM  Shane [name]  has presented today for surgery, with the diagnosis of BLADDER CANCER  The various methods of treatment have been discussed with the patient and family. After consideration of risks, benefits and other options for treatment, the patient has consented to  Procedure(s) with comments: REPEAT TRANSURETHRAL RESECTION OF BLADDER TUMOR (TURBT) (N/A) - 1 HR [id number] [employer] [place] as a surgical intervention .  The patient's history has been reviewed, patient examined, no change in status, stable for surgery.  I have reviewed the patient's chart and labs.  Questions were answered to the patient's satisfaction.     Jorja Loa

## 2017-04-12 NOTE — Discharge Instructions (Signed)
1. You may see some blood in the urine and may have some burning with urination for 48-72 hours. You also may notice that you have to urinate more frequently or urgently after your procedure which is normal.  2. You should call should you develop an inability urinate, fever > 101, persistent nausea and vomiting that prevents you from eating or drinking to stay hydrated.  3. If you have a catheter, you will be taught how to take care of the catheter by the nursing staff prior to discharge from the hospital.  You may periodically feel a strong urge to void with the catheter in place.  This is a bladder spasm and most often can occur when having a bowel movement or moving around. It is typically self-limited and usually will stop after a few minutes.  You may use some Vaseline or Neosporin around the tip of the catheter to reduce friction at the tip of the penis. You may also see some blood in the urine.  A very small amount of blood can make the urine look quite red.  As long as the catheter is draining well, there usually is not a problem.  However, if the catheter is not draining well and is bloody, you should call the office [phone]) to notify us.  It is okay to remove the catheter as instructed on Wednesday morning.  If the urine was fairly clear.    Transurethral Resection of Bladder Tumor, Care After Refer to this sheet in the next few weeks. These instructions provide you with information about caring for yourself after your procedure. Your health care provider may also give you more specific instructions. Your treatment has been planned according to current medical practices, but problems sometimes occur. Call your health care provider if you have any problems or questions after your procedure. What can I expect after the procedure? After the procedure, it is common to have:  A small amount of blood in your urine for up to 2 weeks.  Soreness or mild discomfort from your catheter. After your  catheter is removed, you may have mild soreness, especially when urinating.  Pain in your lower abdomen.  Follow these instructions at home:  Medicines  Take over-the-counter and prescription medicines only as told by your health care provider.  Do not drive or operate heavy machinery while taking prescription pain medicine.  Do not drive for 24 hours if you received a sedative.  If you were prescribed antibiotic medicine, take it as told by your health care provider. Do not stop taking the antibiotic even if you start to feel better. Activity  Return to your normal activities as told by your health care provider. Ask your health care provider what activities are safe for you.  Do not lift anything that is heavier than 10 lb (4.5 kg) for as long as told by your health care provider.  Avoid intense physical activity for as long as told by your health care provider.  Walk at least one time every day. This helps to prevent blood clots. You may increase your physical activity gradually as you start to feel better. General instructions  Do not drink alcohol for as long as told by your health care provider. This is especially important if you are taking prescription pain medicines.  Do not take baths, swim, or use a hot tub until your health care provider approves.  If you have a catheter, follow instructions from your health care provider about caring for your catheter  and your drainage bag.  Drink enough fluid to keep your urine clear or pale yellow.  Wear compression stockings as told by your health care provider. These stockings help to prevent blood clots and reduce swelling in your legs.  Keep all follow-up visits as told by your health care provider. This is important. Contact a health care provider if:  You have pain that gets worse or does not improve with medicine.  You have blood in your urine for more than 2 weeks.  You have cloudy or bad-smelling urine.  You become  constipated. Signs of constipation may include having: ? Fewer than three bowel movements in a week. ? Difficulty having a bowel movement. ? Stools that are dry, hard, or larger than normal.  You have a fever. Get help right away if:  You have: ? Severe pain. ? Bright-red blood in your urine. ? Blood clots in your urine. ? A lot of blood in your urine.  Your catheter has been removed and you are not able to urinate.  You have a catheter in place and the catheter is not draining urine. This information is not intended to replace advice given to you by your health care provider. Make sure you discuss any questions you have with your health care provider. Document Released: 08/04/2015 Document Revised: 04/25/2016 Document Reviewed: 05/15/2015 Elsevier Interactive Patient Education  2018 [place] POST-ANESTHESIA  IMMEDIATELY FOLLOWING SURGERY:  Do not drive or operate machinery for the first twenty four hours after surgery.  Do not make any important decisions for twenty four hours after surgery or while taking narcotic pain medications or sedatives.  If you develop intractable nausea and vomiting or a severe headache please notify your doctor immediately.  FOLLOW-UP:  Please make an appointment with your surgeon as instructed. You do not need to follow up with anesthesia unless specifically instructed to do so.  WOUND CARE INSTRUCTIONS (if applicable):  Keep a dry clean dressing on the anesthesia/puncture wound site if there is drainage.  Once the wound has quit draining you may leave it open to air.  Generally you should leave the bandage intact for twenty four hours unless there is drainage.  If the epidural site drains for more than 36-48 hours please call the anesthesia department.  QUESTIONS?:  Please feel free to call your physician or the hospital operator if you have any questions, and they will be happy to assist you.

## 2017-04-12 NOTE — Anesthesia Postprocedure Evaluation (Signed)
Anesthesia Post Note  Patient: BLANTON KARDELL  Procedure(s) Performed: Procedure(s) (LRB): REPEAT TRANSURETHRAL RESECTION OF BLADDER TUMOR (TURBT) (N/A)  Patient location during evaluation: PACU Anesthesia Type: General Level of consciousness: awake and alert Pain management: satisfactory to patient Vital Signs Assessment: post-procedure vital signs reviewed and stable Respiratory status: spontaneous breathing Cardiovascular status: stable Postop Assessment: no signs of nausea or vomiting Anesthetic complications: no     Last Vitals:  Vitals:   04/12/17 0830 04/12/17 0845  BP: 131/67 131/67  Pulse: (!) 55 (!) 52  Resp: 13 10  Temp:      Last Pain:  Vitals:   04/12/17 0627  TempSrc: Oral                 Labrandon Knoch

## 2017-04-12 NOTE — OR Nursing (Signed)
Atropine dose ordered had to be override with scanner not scanning  0.4mg   given per verbal order Dr.Gonzalez

## 2017-04-12 NOTE — Op Note (Signed)
Preoperative diagnosis: High-grade, non-muscle invasive bladder cancer, status post TURBT.  Postoperative diagnosis: Same.  Principal procedure: Repeat transurethral resection of bladder tumor, greater than 5 centimeters  Surgeon: Sema Stangler  Anesthesia: Gen. with LMA  Specimen: Bladder biopsies  Drain: 27 French Foley catheter to leg bag  Complications: None  Indications: 83 year old male who underwent TURBT of a large bladder neck tumor on 03/01/2017.  Pathology revealed high-grade, non-muscle invasive bladder cancer.  The patient presents at this time for repeat resection to complete resection prior to proposed immunotherapy.  The patient and his family are aware of the risks and complications of the procedure, and desire to proceed.  Findings: Fibrinous material, especially the right bladder neck from prior resection.  No overt urothelial carcinoma noted.  Significant trabeculations.of the bladder.  Description of procedure: Patient was properly identified in the holding area.  He received preoperative IV antibiotics previous taken the operating room where general anesthetic was administered with the LMA.  He is placed in the dorsolithotomy position.  Genitalia and perineum were prepped and draped.  Appropriate timeout was performed.  A 26 French resectoscope sheath was passed through the urethra into the bladder using the visual obturator.  Inspection was carried out.  Fibrinous material noted at the right bladder neck.  There was scar present at the bladder neck from the 6 o'clock to the 12 o'clock position on the right side.  No overt urothelial carcinoma was noted.  The resectoscope element and the cutting loop were then placed.  The entire site of the old resection was then resected again with the cutting loop, from the 12 o'clock running counterclockwise to the 6 o'clock position.  In this manner, the entire old resection site was removed/resected.  The coag current was then used to  provide hemostasis.  Inspection of the resected site revealed no evident bleeding.  At this point tumor chips, the resected chips were irrigated from the bladder and sent for permanent specimen labeled "bladder biopsies".  There being no further bleeding, the scope was removed.  A 20 French Foley catheter was in place, the balloon filled with 10 mL of water.  This was hooked to leg bag.  The patient tolerated procedure well.  He was awakened and then taken to the PACU in stable condition.

## 2017-04-13 ENCOUNTER — Encounter (HOSPITAL_COMMUNITY): Payer: Self-pay | Admitting: Emergency Medicine

## 2017-04-13 ENCOUNTER — Emergency Department (HOSPITAL_COMMUNITY)
Admission: EM | Admit: 2017-04-13 | Discharge: 2017-04-13 | Disposition: A | Discharge status: Home / Self Care | Payer: Medicare HMO | Attending: Emergency Medicine | Admitting: Emergency Medicine

## 2017-04-13 DIAGNOSIS — I2510 Atherosclerotic heart disease of native coronary artery without angina pectoris: Secondary | ICD-10-CM | POA: Insufficient documentation

## 2017-04-13 DIAGNOSIS — Z79899 Other long term (current) drug therapy: Secondary | ICD-10-CM | POA: Diagnosis not present

## 2017-04-13 DIAGNOSIS — R310 Gross hematuria: Secondary | ICD-10-CM | POA: Diagnosis not present

## 2017-04-13 DIAGNOSIS — Z8551 Personal history of malignant neoplasm of bladder: Secondary | ICD-10-CM | POA: Insufficient documentation

## 2017-04-13 DIAGNOSIS — Z87891 Personal history of nicotine dependence: Secondary | ICD-10-CM | POA: Diagnosis not present

## 2017-04-13 DIAGNOSIS — R319 Hematuria, unspecified: Secondary | ICD-10-CM | POA: Diagnosis present

## 2017-04-13 LAB — PROTIME-INR
INR: 0.99
Prothrombin Time: 13.1 seconds (ref 11.4–15.2)

## 2017-04-13 NOTE — ED Provider Notes (Signed)
[facility] DEPT Provider Note   CSN: [id number] Arrival date & time: 04/13/17  1829     History   Chief Complaint Chief Complaint  Patient presents with  . Hematuria    HPI Shane [name] is a 83 y.o. male.  HPI Patient had transurethral cystoscopy with biopsy of bladder masses performed yesterday by Dr. Diona Fanti. Full catheter was left in place. Patient states his urine was initially grossly bloody but clearing. He then pulled catheter out this morning but did not deflate the balloon. He has had gross bleeding from the urethral meatus since. Denies any pain. No lightheadedness or dizziness.  Past Medical History:  Diagnosis Date  . Arteriosclerotic cardiovascular disease (ASCVD)    CABG surgery in 1990. MI in 1986; coronary angiography in 2003-critical LAD with patent LIMA; total obstruction of the RCA with patent RIMA; low normal EF.  [name] Atrial flutter, paroxysmal ([place])    asymptomatic; onset in 2010; 4:1 AVB with low-dose metoprolol; moderate left      atrial enlargement and mild left ventricular hypertrophy with normal ejection fraction by echocardiography  . Benign prostatic hypertrophy   . Bradycardia    a. requiring cessation of beta blocker 12/2016.  [name] Chronic anticoagulation   . Hyperlipidemia    Lipid profile in 09/2010:196, 84, 54, 125  . Mobitz type 1 second degree atrioventricular block 12/17/2016  . Nephrolithiasis   . Polymyalgia rheumatica [facility])     Patient Active Problem List   Diagnosis Date Noted  . Primary cancer of urinary bladder neck ([place]) 03/01/2017  . Mobitz type 1 second degree atrioventricular block 12/17/2016  . 1st degree AV block 12/17/2016  . Symptomatic bradycardia 12/16/2016  . Encounter for therapeutic drug monitoring 10/03/2013  . Colon cancer screening 07/12/2013  . Borderline hypertension 10/07/2011  . Arteriosclerotic cardiovascular disease (ASCVD)   . Hyperlipidemia   . Chronic anticoagulation   . Atrial flutter, paroxysmal ([place])    . Nephrolithiasis 03/26/2009  . Polymyalgia rheumatica ([place]) 03/26/2009  . BENIGN PROSTATIC HYPERTROPHY, HX OF 03/26/2009    Past Surgical History:  Procedure Laterality Date  . COLONOSCOPY  08/21/01   RMR: Left-sided diverticulum.  Remainder of colonic mucosa appeared normal  . COLONOSCOPY N/A 07/31/2013   Procedure: COLONOSCOPY;  Surgeon: Danie Binder, MD;  Location: [facility] ENDO SUITE;  Service: Endoscopy;  Laterality: N/A;  10:30  . CORONARY ARTERY BYPASS GRAFT  1990  . LUMBAR DISC SURGERY    . TRANSURETHRAL RESECTION OF BLADDER TUMOR N/A 03/01/2017   Procedure: TRANSURETHRAL RESECTION OF BLADDER TUMOR (TURBT);  Surgeon: Franchot Gallo, MD;  Location: [facility] ORS;  Service: Urology;  Laterality: N/A;  . TRANSURETHRAL RESECTION OF BLADDER TUMOR N/A 04/12/2017   Procedure: REPEAT TRANSURETHRAL RESECTION OF BLADDER TUMOR (TURBT);  Surgeon: Franchot Gallo, MD;  Location: [facility] ORS;  Service: Urology;  Laterality: N/A;  1 HR [phone] [place] Medications    Prior to Admission medications   Medication Sig Start Date End Date Taking? Authorizing Provider  acetaminophen (TYLENOL) 500 MG tablet Take 1,000 mg by mouth every 8 (eight) hours as needed for mild pain.   Yes [provider]  atorvastatin (LIPITOR) 80 MG tablet Take 1 tablet (80 mg total) by mouth daily. Patient taking differently: Take 80 mg by mouth every evening.  12/08/16  Yes Branch, Alphonse Guild, MD  Tamsulosin HCl (FLOMAX) 0.4 MG CAPS Take 0.4 mg by mouth daily after supper.    Yes [provider]  traMADol (ULTRAM) 50 MG tablet Take 1 tablet (50 mg total) by mouth every 6 (six) hours as needed. Patient not taking: Reported on 04/13/2017 04/12/17   Franchot Gallo, MD  warfarin (COUMADIN) 5 MG tablet Take 7.5 mg by mouth daily.    [provider]    Family History Family History  Problem Relation Age of Onset  . Heart attack Mother   . Coronary artery disease  Brother        CABG + pacemaker  . Pneumonia Brother        infant death  . Colon cancer Neg Hx   . Colon polyps Neg Hx     Social History Social History  Substance Use Topics  . Smoking status: Former Smoker    Packs/day: 1.50    Years: 25.00    Quit date: 10/07/1982  . Smokeless tobacco: Never Used  . Alcohol use Yes     Comment: 1 bottle of wine weekly; occasional beer     Allergies   Patient has no known allergies.   Review of Systems Review of Systems  Constitutional: Negative for chills and fever.  Gastrointestinal: Negative for abdominal pain, diarrhea, nausea and vomiting.  Genitourinary: Positive for hematuria. Negative for difficulty urinating, dysuria, flank pain, frequency, penile pain and penile swelling.  Musculoskeletal: Negative for back pain and neck pain.  Skin: Negative for rash and wound.  Neurological: Negative for dizziness, weakness, light-headedness and numbness.  All other systems reviewed and are negative.    Physical Exam Updated Vital Signs BP (!) 157/73 (BP Location: Right Arm)   Pulse 60   Temp 98.1 F (36.7 C) (Oral)   Resp 16   Ht 5\' 8"  (1.727 m)   Wt 78.5 kg (173 lb)   SpO2 97%   BMI 26.30 kg/m   Physical Exam  Constitutional: He is oriented to person, place, and time. He appears well-developed and well-nourished. No distress.  HENT:  Head: Normocephalic and atraumatic.  Eyes: Pupils are equal, round, and reactive to light. EOM are normal.  Neck: Normal range of motion. Neck supple.  Cardiovascular: Normal rate and regular rhythm.   Pulmonary/Chest: Effort normal and breath sounds normal.  Abdominal: Soft. Bowel sounds are normal. There is no tenderness. There is no rebound and no guarding.  Genitourinary: No penile tenderness.  Genitourinary Comments: Small amount of gross red blood at urethral meatus. No apparent lacerations.  Musculoskeletal: Normal range of motion. He exhibits no edema or tenderness.  Neurological: He is  alert and oriented to person, place, and time.  Skin: Skin is warm and dry. No rash noted. No erythema.  Psychiatric: He has a normal mood and affect. His behavior is normal.  Nursing note and vitals reviewed.    ED Treatments / Results  Labs (all labs ordered are listed, but only abnormal results are displayed) Labs Reviewed  PROTIME-INR    EKG  EKG Interpretation None       Radiology No results found.  Procedures Procedures (including critical care time)  Medications Ordered in ED Medications - No data to display   Initial Impression / Assessment and Plan / ED Course  I have reviewed the triage vital signs and the nursing notes.  Pertinent labs & imaging results that were available during my care of the patient were reviewed by me and considered in my medical decision making (see chart for details).     Discussed with Dr. Diona Fanti. Recommends inserting an 53 French Foley catheter have patient follow-up  in the office. States his office will contact the patient to arrange appointment.  Foley catheter placed. Patient's hematuria has significantly improved. Final Clinical Impressions(s) / ED Diagnoses   Final diagnoses:  Gross hematuria    New Prescriptions Discharge Medication List as of 04/13/2017 11:05 AM       Julianne Rice, MD 04/15/17 [phone]

## 2017-04-13 NOTE — ED Triage Notes (Signed)
Per pt he was trying to remove his catheter and forgot to deflate the balloon.  Pt is bleeding from his penis after removing his catheter.  Pt states that he is not in pain at this time.

## 2017-04-14 ENCOUNTER — Telehealth: Payer: Self-pay | Admitting: *Deleted

## 2017-04-14 NOTE — Telephone Encounter (Signed)
Pt was admitted in the hospital and while admitted they took him off his coumadin, he'd like to know when he needs to start back. Can be reached @ [phone]

## 2017-04-14 NOTE — Telephone Encounter (Signed)
Returned call to the pt & no answer, therefore, left a message to callback.  Pt was discharged from hospital on 03/02/17 & instructed to stop Coumadin at that time & to follow up with Dr. Diona Fanti in 1 week.  Also, pt went to ER on 04/13/17 after having gross bleeding & pulling out his catheter and nothing mentioned in reference to Coumadin.

## 2017-04-15 ENCOUNTER — Ambulatory Visit (INDEPENDENT_AMBULATORY_CARE_PROVIDER_SITE_OTHER): Payer: Medicare HMO | Admitting: Urology

## 2017-04-15 DIAGNOSIS — C678 Malignant neoplasm of overlapping sites of bladder: Secondary | ICD-10-CM | POA: Diagnosis not present

## 2017-04-15 DIAGNOSIS — R319 Hematuria, unspecified: Secondary | ICD-10-CM

## 2017-04-18 ENCOUNTER — Telehealth: Payer: Self-pay | Admitting: *Deleted

## 2017-04-18 NOTE — Telephone Encounter (Signed)
Please call patient regarding starting back on his Coumadin. / tg

## 2017-04-18 NOTE — Telephone Encounter (Signed)
Spoke with wife.  Foley catheter was removed last Friday.  Pt has not had any bleeding since Saturday 8/18.  Told wife if pt does not have any bleeding today or tomorrow he coumadin restart coumadin tomorrow night at regular dose.  Has INR appt for 05/04/17.  Pt to stop coumadin immediately if bleeding starts back.

## 2017-04-25 DIAGNOSIS — C675 Malignant neoplasm of bladder neck: Secondary | ICD-10-CM | POA: Diagnosis not present

## 2017-04-25 DIAGNOSIS — I480 Paroxysmal atrial fibrillation: Secondary | ICD-10-CM | POA: Diagnosis not present

## 2017-04-25 DIAGNOSIS — I2510 Atherosclerotic heart disease of native coronary artery without angina pectoris: Secondary | ICD-10-CM | POA: Diagnosis not present

## 2017-05-04 ENCOUNTER — Ambulatory Visit: Payer: Medicare HMO | Admitting: Cardiology

## 2017-05-04 NOTE — Progress Notes (Deleted)
Clinical Summary Shane [name] is a 83 y.o.male seen today for follow up of the following medical problems.   1. CAD - prior CABG in 1990 - last cath 2003 as reported below. - Jan 2010 echo LVEF 55-60%  - denies any chest pain, no SOB or DOE. Walks 4+ miles daily without troubles - compliant with meds   2. Hyperlipidemia - compliant with lipitor - due for labs with pcp in January  3. Aflutter - denies any recent palpitations - denies any bleeding  troubles with coumadin.   - has had issues w hematuria  Past Medical History:  Diagnosis Date  . Arteriosclerotic cardiovascular disease (ASCVD)    CABG surgery in 1990. MI in 1986; coronary angiography in 2003-critical LAD with patent LIMA; total obstruction of the RCA with patent RIMA; low normal EF.  [name] Atrial flutter, paroxysmal ([place])    asymptomatic; onset in 2010; 4:1 AVB with low-dose metoprolol; moderate left      atrial enlargement and mild left ventricular hypertrophy with normal ejection fraction by echocardiography  . Benign prostatic hypertrophy   . Bradycardia    a. requiring cessation of beta blocker 12/2016.  [name] Chronic anticoagulation   . Hyperlipidemia    Lipid profile in 09/2010:196, 84, 54, 125  . Mobitz type 1 second degree atrioventricular block 12/17/2016  . Nephrolithiasis   . Polymyalgia rheumatica (HCC)      No Known Allergies   Current Outpatient Prescriptions  Medication Sig Dispense Refill  . acetaminophen (TYLENOL) 500 MG tablet Take 1,000 mg by mouth every 8 (eight) hours as needed for mild pain.    [name] atorvastatin (LIPITOR) 80 MG tablet Take 1 tablet (80 mg total) by mouth daily. (Patient taking differently: Take 80 mg by mouth every evening. ) 90 tablet 3  . Tamsulosin HCl (FLOMAX) 0.4 MG CAPS Take 0.4 mg by mouth daily after supper.     . traMADol (ULTRAM) 50 MG tablet Take 1 tablet (50 mg total) by mouth every 6 (six) hours as needed. (Patient not taking: Reported on 04/13/2017) 10 tablet 0    . warfarin (COUMADIN) 5 MG tablet Take 7.5 mg by mouth daily.     No current facility-administered medications for this visit.      Past Surgical History:  Procedure Laterality Date  . COLONOSCOPY  08/21/01   RMR: Left-sided diverticulum.  Remainder of colonic mucosa appeared normal  . COLONOSCOPY N/A 07/31/2013   Procedure: COLONOSCOPY;  Surgeon: Danie Binder, MD;  Location: [facility];  Service: Endoscopy;  Laterality: N/A;  10:30  . CORONARY ARTERY BYPASS GRAFT  1990  . LUMBAR DISC SURGERY    . TRANSURETHRAL RESECTION OF BLADDER TUMOR N/A 03/01/2017   Procedure: TRANSURETHRAL RESECTION OF BLADDER TUMOR (TURBT);  Surgeon: Franchot Gallo, MD;  Location: [facility];  Service: Urology;  Laterality: N/A;  . TRANSURETHRAL RESECTION OF BLADDER TUMOR N/A 04/12/2017   Procedure: REPEAT TRANSURETHRAL RESECTION OF BLADDER TUMOR (TURBT);  Surgeon: Franchot Gallo, MD;  Location: [facility];  Service: Urology;  Laterality: N/A;  1 HR [phone] [employer] [place]     No Known Allergies    Family History  Problem Relation Age of Onset  . Heart attack Mother   . Coronary artery disease Brother        CABG + pacemaker  . Pneumonia Brother        infant death  . Colon cancer Neg Hx   . Colon polyps Neg Hx  Social History Mr. Godshall reports that he quit smoking about 34 years ago. He has a 37.50 pack-year smoking history. He has never used smokeless tobacco. Mr. Widmer reports that he drinks alcohol.   Review of Systems CONSTITUTIONAL: No weight loss, fever, chills, weakness or fatigue.  HEENT: Eyes: No visual loss, blurred vision, double vision or yellow sclerae.No hearing loss, sneezing, congestion, runny nose or sore throat.  SKIN: No rash or itching.  CARDIOVASCULAR:  RESPIRATORY: No shortness of breath, cough or sputum.  GASTROINTESTINAL: No anorexia, nausea, vomiting or diarrhea. No abdominal pain or blood.  GENITOURINARY: No burning on urination, no  polyuria NEUROLOGICAL: No headache, dizziness, syncope, paralysis, ataxia, numbness or tingling in the extremities. No change in bowel or bladder control.  MUSCULOSKELETAL: No muscle, back pain, joint pain or stiffness.  LYMPHATICS: No enlarged nodes. No history of splenectomy.  PSYCHIATRIC: No history of depression or anxiety.  ENDOCRINOLOGIC: No reports of sweating, cold or heat intolerance. No polyuria or polydipsia.  [name]   Physical Examination There were no vitals filed for this visit. There were no vitals filed for this visit.  Gen: resting comfortably, no acute distress HEENT: no scleral icterus, pupils equal round and reactive, no palptable cervical adenopathy,  CV Resp: Clear to auscultation bilaterally GI: abdomen is soft, non-tender, non-distended, normal bowel sounds, no hepatosplenomegaly MSK: extremities are warm, no edema.  Skin: warm, no rash Neuro:  no focal deficits Psych: appropriate affect   Diagnostic Studies Cath 07/2002 FINDINGS:  1. Left main trunk. Medium caliber vessel with mild ________.  2. LAD. This is a medium caliber vessel which supplies a trivial first  diagonal Shane [name] proximal segment, medium caliber second diagonal Shane [name]  thereafter. The LAD then extends to the apex. The LAD has moderate  disease, 50% of the proximal left segment, which then extends into a  narrowing of 70% encompassing the trivial diagonal Shane [name]. The distal  LAD has mild irregularities and is seen to fill predominantly via the  LIMA graft. There is mid narrowing of 30%. The second diagonal Shane [name]  has an ostial narrowing of 50% and fills predominantly via antegrade  flow.  3. Left circumflex artery. This is a medium caliber vessel that supplies a  small first marginal Shane [name] and the proximal segment a larger second  marginal Shane [name]. In the mid-section, there is moderate narrowing of 30-  40% of the proximal segment of the second marginal Shane [name].  4. Right  coronary artery. This was a dominant medium caliber vessel that  supplies the posterior descending artery and a posterior ventricular  Shane [name] in its terminal segment. The right coronary artery is 100%  occluded in the mid-section. The distal vessel fills the in situ RIMA  graft anastomosed to the distal right coronary artery. The posterior  descending artery and the posterior ventricular Shane [name] have mild _______  of 30%.  5. RIMA to the distal right coronary artery is patent. This was an in situ  graft.  6. LIMA to the LAD is patent. This was also an in situ graft.  7. Left ventricle. Normal end-systolic and end-diastolic dimensions.  Normal left ventricular function is well preserved. Ejection fraction 50-  55%. No mitral regurgitation. LV pressure is 120/5. Aortic is 120/65.  LVEDP is 15.  ASSESSMENT AND PLAN: The patient is a 83 year old gentleman with two-vessel  coronary artery disease that is well revascularized surgically. He has well-  preserved LV function. The only concern is the second diagonal Shane [name] which  is compromised by  moderate disease in the proximal and mid-LAD. This does  not appear to be critical in nature; however, this disease is presumptively  the reason the patient underwent bypass surgery. In addition, the amount of  myocardium supplied by this diagonal Shane [name] is relatively small and is  unlikely to elicit the symptoms the patient presented with. We will thus  pursue a conservative course of medical therapy should the patient have  recurrent symptoms or an abnormal stress imaging study. Percutaneous  intervention may be considered to improve flow to the second diagonal  Shane [name].   Jan 2010 Echo SUMMARY - Overall left ventricular systolic function was normal. Left ventricular ejection fraction was estimated , range being 55 % to 60 %. There were no left ventricular regional wall motion abnormalities. Left ventricular wall thickness  was mildly increased. There was moderate basal septal hypertrophy. - The aortic valve was mildly calcified. - There was mild fibrocalcific change of the aortic root. - The effective orifice of mitral regurgitation by proximal isovelocity surface area was 0.13 cm^2. The volume of mitral regurgitation by proximal isovelocity surface area was 19 cc. - The left atrium was moderately dilated. - There was mild right ventricular hypertrophy. - The right atrium was mild to moderately dilated.     Assessment and Plan   1. CAD - no current symptoms - continue risk factor modification and secondary prevention  2. Hyperlipidemia - continue high dose statin - has upcoming panel with pcp in January  3. Aflutter - no current symptoms, continue metoprolol and coumadin - he is not currently interested in NOACs   F/u 1 year     Arnoldo Lenis, M.D., F.A.C.C.

## 2017-05-05 ENCOUNTER — Encounter: Payer: Self-pay | Admitting: Cardiology

## 2017-05-05 ENCOUNTER — Ambulatory Visit (INDEPENDENT_AMBULATORY_CARE_PROVIDER_SITE_OTHER): Payer: Medicare HMO | Admitting: Cardiovascular Disease

## 2017-05-05 ENCOUNTER — Ambulatory Visit (INDEPENDENT_AMBULATORY_CARE_PROVIDER_SITE_OTHER): Payer: Medicare HMO | Admitting: Cardiology

## 2017-05-05 VITALS — BP 122/70 | HR 72 | Ht 69.0 in | Wt 178.0 lb

## 2017-05-05 DIAGNOSIS — Z7901 Long term (current) use of anticoagulants: Secondary | ICD-10-CM

## 2017-05-05 DIAGNOSIS — E782 Mixed hyperlipidemia: Secondary | ICD-10-CM | POA: Diagnosis not present

## 2017-05-05 DIAGNOSIS — I2510 Atherosclerotic heart disease of native coronary artery without angina pectoris: Secondary | ICD-10-CM

## 2017-05-05 DIAGNOSIS — I4892 Unspecified atrial flutter: Secondary | ICD-10-CM

## 2017-05-05 DIAGNOSIS — Z5181 Encounter for therapeutic drug level monitoring: Secondary | ICD-10-CM

## 2017-05-05 LAB — POCT INR: INR: 2.1

## 2017-05-05 NOTE — Patient Instructions (Signed)

## 2017-05-05 NOTE — Progress Notes (Signed)
Clinical Summary Mr. Dieujuste is a 83 y.o.male seen today for follow up of the following medical problems.   1. CAD - prior CABG in 1990 - last cath 2003 as reported below. - Jan 2010 echo LVEF 55-60%   - no recent chest pain. No SOB or DOE. Walks regularly, about 20 miles a week without troubles - compliant with meds  2. Hyperlipidemia - compliant with lipitor - 10/2016 TC 124 TG 54 HDL 62 LDL 51  3. Aflutter - no recent palpitations - intermittent low heart rates. EKG 8/7 showed sinus brady, wenchebach block.    4. Hematuria - has recently been off coumadin for periods of time due to recurrent hematuria - he is followed by urology  - no recurrent bleeding, currently tolerating coumadin  5. Chronic bradycardia - asymptomatic   Past Medical History:  Diagnosis Date  . Arteriosclerotic cardiovascular disease (ASCVD)    CABG surgery in 1990. MI in 1986; coronary angiography in 2003-critical LAD with patent LIMA; total obstruction of the RCA with patent RIMA; low normal EF.  [name] Atrial flutter, paroxysmal ([place])    asymptomatic; onset in 2010; 4:1 AVB with low-dose metoprolol; moderate left      atrial enlargement and mild left ventricular hypertrophy with normal ejection fraction by echocardiography  . Benign prostatic hypertrophy   . Bradycardia    a. requiring cessation of beta blocker 12/2016.  [name] Chronic anticoagulation   . Hyperlipidemia    Lipid profile in 09/2010:196, 84, 54, 125  . Mobitz type 1 second degree atrioventricular block 12/17/2016  . Nephrolithiasis   . Polymyalgia rheumatica (HCC)      No Known Allergies   Current Outpatient Prescriptions  Medication Sig Dispense Refill  . acetaminophen (TYLENOL) 500 MG tablet Take 1,000 mg by mouth every 8 (eight) hours as needed for mild pain.    [name] atorvastatin (LIPITOR) 80 MG tablet Take 1 tablet (80 mg total) by mouth daily. (Patient taking differently: Take 80 mg by mouth every evening. ) 90 tablet 3  .  Tamsulosin HCl (FLOMAX) 0.4 MG CAPS Take 0.4 mg by mouth daily after supper.     . traMADol (ULTRAM) 50 MG tablet Take 1 tablet (50 mg total) by mouth every 6 (six) hours as needed. (Patient not taking: Reported on 04/13/2017) 10 tablet 0  . warfarin (COUMADIN) 5 MG tablet Take 7.5 mg by mouth daily.     No current facility-administered medications for this visit.      Past Surgical History:  Procedure Laterality Date  . COLONOSCOPY  08/21/01   RMR: Left-sided diverticulum.  Remainder of colonic mucosa appeared normal  . COLONOSCOPY N/A 07/31/2013   Procedure: COLONOSCOPY;  Surgeon: Danie Binder, MD;  [facility]: [facility];  Service: Endoscopy;  Laterality: N/A;  10:30  . CORONARY ARTERY BYPASS GRAFT  1990  . LUMBAR DISC SURGERY    . TRANSURETHRAL RESECTION OF BLADDER TUMOR N/A 03/01/2017   Procedure: TRANSURETHRAL RESECTION OF BLADDER TUMOR (TURBT);  Surgeon: Franchot Gallo, MD;  Location: [facility];  Service: Urology;  Laterality: N/A;  . TRANSURETHRAL RESECTION OF BLADDER TUMOR N/A 04/12/2017   Procedure: REPEAT TRANSURETHRAL RESECTION OF BLADDER TUMOR (TURBT);  Surgeon: Franchot Gallo, MD;  Location: [facility];  Service: Urology;  Laterality: N/A;  1 HR [phone] [employer] [place]     No Known Allergies    Family History  Problem Relation Age of Onset  . Heart attack Mother   . Coronary artery disease Brother  CABG + pacemaker  . Pneumonia Brother        infant death  . Colon cancer Neg Hx   . Colon polyps Neg Hx      Social History Mr. Carneiro reports that he quit smoking about 34 years ago. He has a 37.50 pack-year smoking history. He has never used smokeless tobacco. Mr. Luckow reports that he drinks alcohol.   Review of Systems CONSTITUTIONAL: No weight loss, fever, chills, weakness or fatigue.  HEENT: Eyes: No visual loss, blurred vision, double vision or yellow sclerae.No hearing loss, sneezing, congestion, runny nose or sore throat.  SKIN: No  rash or itching.  CARDIOVASCULAR: per hpi RESPIRATORY: No shortness of breath, cough or sputum.  GASTROINTESTINAL: No anorexia, nausea, vomiting or diarrhea. No abdominal pain or blood.  GENITOURINARY: No burning on urination, no polyuria NEUROLOGICAL: No headache, dizziness, syncope, paralysis, ataxia, numbness or tingling in the extremities. No change in bowel or bladder control.  MUSCULOSKELETAL: No muscle, back pain, joint pain or stiffness.  LYMPHATICS: No enlarged nodes. No history of splenectomy.  PSYCHIATRIC: No history of depression or anxiety.  ENDOCRINOLOGIC: No reports of sweating, cold or heat intolerance. No polyuria or polydipsia.  [name]   Physical Examination Vitals:   05/05/17 1406  BP: 122/70  Pulse: 72  SpO2: 97%   Vitals:   05/05/17 1406  Weight: 178 lb (80.7 kg)  Height: 5\' 9"  (1.753 m)    Gen: resting comfortably, no acute distress HEENT: no scleral icterus, pupils equal round and reactive, no palptable cervical adenopathy,  CV: RRR, no m/r/g, no jvd Resp: Clear to auscultation bilaterally GI: abdomen is soft, non-tender, non-distended, normal bowel sounds, no hepatosplenomegaly MSK: extremities are warm, no edema.  Skin: warm, no rash Neuro:  no focal deficits Psych: appropriate affect   Diagnostic Studies Cath 07/2002 FINDINGS:  1. Left main trunk. Medium caliber vessel with mild ________.  2. LAD. This is a medium caliber vessel which supplies a trivial first  diagonal Tylyn Derwin proximal segment, medium caliber second diagonal Kekai Geter  thereafter. The LAD then extends to the apex. The LAD has moderate  disease, 50% of the proximal left segment, which then extends into a  narrowing of 70% encompassing the trivial diagonal Andree Heeg. The distal  LAD has mild irregularities and is seen to fill predominantly via the  LIMA graft. There is mid narrowing of 30%. The second diagonal Lai Hendriks  has an ostial narrowing of 50% and fills predominantly via  antegrade  flow.  3. Left circumflex artery. This is a medium caliber vessel that supplies a  small first marginal Kaesha Kirsch and the proximal segment a larger second  marginal Sylvia Helms. In the mid-section, there is moderate narrowing of 30-  40% of the proximal segment of the second marginal Amillion Macchia.  4. Right coronary artery. This was a dominant medium caliber vessel that  supplies the posterior descending artery and a posterior ventricular  Meiko Stranahan in its terminal segment. The right coronary artery is 100%  occluded in the mid-section. The distal vessel fills the in situ RIMA  graft anastomosed to the distal right coronary artery. The posterior  descending artery and the posterior ventricular Shelby Peltz have mild _______  of 30%.  5. RIMA to the distal right coronary artery is patent. This was an in situ  graft.  6. LIMA to the LAD is patent. This was also an in situ graft.  7. Left ventricle. Normal end-systolic and end-diastolic dimensions.  Normal left ventricular function is well preserved. Ejection  fraction 50-  55%. No mitral regurgitation. LV pressure is 120/5. Aortic is 120/65.  LVEDP is 15.  ASSESSMENT AND PLAN: The patient is a 83 year old gentleman with two-vessel  coronary artery disease that is well revascularized surgically. He has well-  preserved LV function. The only concern is the second diagonal Jemarcus Dougal which  is compromised by moderate disease in the proximal and mid-LAD. This does  not appear to be critical in nature; however, this disease is presumptively  the reason the patient underwent bypass surgery. In addition, the amount of  myocardium supplied by this diagonal Brownie Nehme is relatively small and is  unlikely to elicit the symptoms the patient presented with. We will thus  pursue a conservative course of medical therapy should the patient have  recurrent symptoms or an abnormal stress imaging study. Percutaneous  intervention may be considered  to improve flow to the second diagonal  Debi Cousin.   Jan 2010 Echo SUMMARY - Overall left ventricular systolic function was normal. Left ventricular ejection fraction was estimated , range being 55 % to 60 %. There were no left ventricular regional wall motion abnormalities. Left ventricular wall thickness was mildly increased. There was moderate basal septal hypertrophy. - The aortic valve was mildly calcified. - There was mild fibrocalcific change of the aortic root. - The effective orifice of mitral regurgitation by proximal isovelocity surface area was 0.13 cm^2. The volume of mitral regurgitation by proximal isovelocity surface area was 19 cc. - The left atrium was moderately dilated. - There was mild right ventricular hypertrophy. - The right atrium was mild to moderately dilated.         Assessment and Plan   1. CAD - no symptoms, he will conitnue current meds  2. Hyperlipidemia - continue statin  3. Aflutter - no current symptoms - continue current meds including anticoag - has not been on av nodal agent due to bradycardia  F/u 1 year     Arnoldo Lenis, M.D.

## 2017-05-13 ENCOUNTER — Ambulatory Visit: Payer: Medicare HMO | Admitting: Urology

## 2017-05-17 ENCOUNTER — Ambulatory Visit (INDEPENDENT_AMBULATORY_CARE_PROVIDER_SITE_OTHER): Payer: Medicare HMO | Admitting: Urology

## 2017-05-17 DIAGNOSIS — C678 Malignant neoplasm of overlapping sites of bladder: Secondary | ICD-10-CM | POA: Diagnosis not present

## 2017-05-24 ENCOUNTER — Ambulatory Visit (INDEPENDENT_AMBULATORY_CARE_PROVIDER_SITE_OTHER): Payer: Medicare HMO | Admitting: Urology

## 2017-05-24 DIAGNOSIS — C678 Malignant neoplasm of overlapping sites of bladder: Secondary | ICD-10-CM | POA: Diagnosis not present

## 2017-05-26 ENCOUNTER — Ambulatory Visit (INDEPENDENT_AMBULATORY_CARE_PROVIDER_SITE_OTHER): Payer: Medicare HMO | Admitting: Otolaryngology

## 2017-05-26 DIAGNOSIS — H6123 Impacted cerumen, bilateral: Secondary | ICD-10-CM

## 2017-05-26 DIAGNOSIS — H903 Sensorineural hearing loss, bilateral: Secondary | ICD-10-CM | POA: Diagnosis not present

## 2017-05-30 ENCOUNTER — Other Ambulatory Visit: Payer: Self-pay | Admitting: Cardiology

## 2017-05-30 ENCOUNTER — Ambulatory Visit (INDEPENDENT_AMBULATORY_CARE_PROVIDER_SITE_OTHER): Payer: Medicare HMO | Admitting: *Deleted

## 2017-05-30 DIAGNOSIS — I4892 Unspecified atrial flutter: Secondary | ICD-10-CM | POA: Diagnosis not present

## 2017-05-30 DIAGNOSIS — R69 Illness, unspecified: Secondary | ICD-10-CM | POA: Diagnosis not present

## 2017-05-30 DIAGNOSIS — Z5181 Encounter for therapeutic drug level monitoring: Secondary | ICD-10-CM

## 2017-05-30 LAB — POCT INR: INR: 2.3

## 2017-05-31 ENCOUNTER — Ambulatory Visit (INDEPENDENT_AMBULATORY_CARE_PROVIDER_SITE_OTHER): Payer: Medicare HMO | Admitting: Urology

## 2017-05-31 DIAGNOSIS — C678 Malignant neoplasm of overlapping sites of bladder: Secondary | ICD-10-CM | POA: Diagnosis not present

## 2017-06-03 ENCOUNTER — Telehealth: Payer: Self-pay | Admitting: Cardiology

## 2017-06-03 NOTE — Telephone Encounter (Signed)
Per phone call from pt's wife-states he's dizzy, would like to know what he could take.

## 2017-06-03 NOTE — Telephone Encounter (Signed)
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 She asked if we were open on Saturday and I told her no.She then said she would call Dr.Fagan

## 2017-06-06 ENCOUNTER — Encounter: Payer: Self-pay | Admitting: Adult Health

## 2017-06-06 ENCOUNTER — Ambulatory Visit (INDEPENDENT_AMBULATORY_CARE_PROVIDER_SITE_OTHER): Payer: Medicare HMO | Admitting: Adult Health

## 2017-06-06 VITALS — BP 120/56 | HR 63 | Ht 69.0 in | Wt 186.0 lb

## 2017-06-06 DIAGNOSIS — I441 Atrioventricular block, second degree: Secondary | ICD-10-CM | POA: Diagnosis not present

## 2017-06-06 DIAGNOSIS — R42 Dizziness and giddiness: Secondary | ICD-10-CM | POA: Diagnosis not present

## 2017-06-06 DIAGNOSIS — I4892 Unspecified atrial flutter: Secondary | ICD-10-CM | POA: Diagnosis not present

## 2017-06-06 DIAGNOSIS — I2510 Atherosclerotic heart disease of native coronary artery without angina pectoris: Secondary | ICD-10-CM

## 2017-06-06 NOTE — Patient Instructions (Signed)
Medication Instructions:  Your physician recommends that you continue on your current medications as directed. Please refer to the Current Medication list given to you today.   Labwork: Your physician recommends that you return for lab work in: Today    Testing/Procedures: Your physician has recommended that you wear an event monitor. Event monitors are medical devices that record the heart's electrical activity. Doctors most often [place] these monitors to diagnose arrhythmias. Arrhythmias are problems with the speed or rhythm of the heartbeat. The monitor is a small, portable device. You can wear one while you do your normal daily activities. This is usually used to diagnose what is causing palpitations/syncope (passing out).    Follow-Up: Your physician recommends that you schedule a follow-up appointment in: 14 Days    Any Other Special Instructions Will Be Listed Below (If Applicable).     If you need a refill on your cardiac medications before your next appointment, please call your pharmacy.

## 2017-06-06 NOTE — Telephone Encounter (Signed)
Can we f/u with this patient   Zandra Abts MD

## 2017-06-06 NOTE — Progress Notes (Signed)
Cardiology Office Note   Date:  06/06/2017   ID:  Shane [name], DOB February 28, 1933, MRN [id number]  PCP:  Shane Noble, [name]  Cardiologist: Shane [name]  Chief Complaint  Patient presents with  . Dizziness      History of Present Illness: Shane [name] is a 83 y.o. male who presents for focused office visit with complaints of dizziness. He has a history of atrial flutter, hyperlipidemia, CAD, with last cath in 2008 with plans to treat medically,   The patient was actually seen in our office one month ago without complaints. He apparently had episode of dizziness and his wife called to ask what he could take for relief.  He describes 3-4 incidents where he felt dizzy and lightheaded.No syncope. No chest pain. "Funny feeling."   Past Medical History:  Diagnosis Date  . Arteriosclerotic cardiovascular disease (ASCVD)    CABG surgery in 1990. MI in 1986; coronary angiography in 2003-critical LAD with patent LIMA; total obstruction of the RCA with patent RIMA; low normal EF.  [name] Atrial flutter, paroxysmal ([place])    asymptomatic; onset in 2010; 4:1 AVB with low-dose metoprolol; moderate left      atrial enlargement and mild left ventricular hypertrophy with normal ejection fraction by echocardiography  . Benign prostatic hypertrophy   . Bradycardia    a. requiring cessation of beta blocker 12/2016.  [name] Chronic anticoagulation   . Hyperlipidemia    Lipid profile in 09/2010:196, 84, 54, 125  . Mobitz type 1 second degree atrioventricular block 12/17/2016  . Nephrolithiasis   . Polymyalgia rheumatica (HCC)     Past Surgical History:  Procedure Laterality Date  . COLONOSCOPY  08/21/01   RMR: Left-sided diverticulum.  Remainder of colonic mucosa appeared normal  . COLONOSCOPY N/A 07/31/2013   Procedure: COLONOSCOPY;  Surgeon: Shane [name];  [facility];  Service: Endoscopy;  Laterality: N/A;  10:30  . CORONARY ARTERY BYPASS GRAFT  1990  . LUMBAR DISC SURGERY    .  TRANSURETHRAL RESECTION OF BLADDER TUMOR N/A 03/01/2017   Procedure: TRANSURETHRAL RESECTION OF BLADDER TUMOR (TURBT);  Surgeon: Shane [name];  Location: [facility];  Service: Urology;  Laterality: N/A;  . TRANSURETHRAL RESECTION OF BLADDER TUMOR N/A 04/12/2017   Procedure: REPEAT TRANSURETHRAL RESECTION OF BLADDER TUMOR (TURBT);  Surgeon: Shane [name];  Location: [facility];  Service: Urology;  Laterality: N/A;  1 HR [phone] [employer] [place]     Current Outpatient Prescriptions  Medication Sig Dispense Refill  . acetaminophen (TYLENOL) 500 MG tablet Take 1,000 mg by mouth every 8 (eight) hours as needed for mild pain.    [name] atorvastatin (LIPITOR) 80 MG tablet Take 1 tablet (80 mg total) by mouth daily. (Patient taking differently: Take 80 mg by mouth every evening. ) 90 tablet 3  . Tamsulosin HCl (FLOMAX) 0.4 MG CAPS Take 0.4 mg by mouth daily after supper.     . warfarin (COUMADIN) 5 MG tablet Take 7.5 mg by mouth daily.    [name] warfarin (COUMADIN) 5 MG tablet TAKE ONE & ONE-HALF TABLETS BY MOUTH ONCE DAILY OR  AS  DIRECTED 135 tablet 3   No current facility-administered medications for this visit.     Allergies:   Patient has no known allergies.    Social History:  The patient  reports that he quit smoking about 34 years ago. He has a 37.50 pack-year smoking history. He has never used smokeless tobacco. He reports that he drinks alcohol. He  reports that he does not use drugs.   Family History:  The patient's family history includes Coronary artery disease in his brother; Heart attack in his mother; Pneumonia in his brother.    ROS: All other systems are reviewed and negative. Unless otherwise mentioned in H&P    PHYSICAL EXAM: VS:  BP (!) 120/56   Pulse 63   Ht 5\' 9"  (1.753 m)   Wt 186 lb (84.4 kg)   SpO2 96%   BMI 27.47 kg/m  , BMI Body mass index is 27.47 kg/m. GEN: Well nourished, well developed, in no acute distress  HEENT: normal  Neck: no JVD,  carotid bruits, or masses Cardiac: RRR;bradycardic, 1/6 systolic murmurs, rubs, or gallops,no edema  Respiratory:  Clear to auscultation bilaterally, normal work of breathing GI: soft, nontender, nondistended, + BS MS: no deformity or atrophy  Skin: warm and dry, no rash Neuro:  Strength and sensation are intact Psych: euthymic mood, full affect   EKG:  The ekg ordered today demonstrates Second degree heart block,  Wenckebach followed by NSR. Rate of 62 bpm. (This was reviewed also by Dr. Bronson [name] as well on site).     Recent Labs: 12/16/2016: TSH 0.651 04/07/2017: BUN 17; Creatinine, Ser 0.60; Hemoglobin 11.9; Platelets 223; Potassium 4.4; Sodium 138    Lipid Panel    Component Value Date/Time   CHOL 124 12/25/2011 0800   TRIG 40 12/25/2011 0800   TRIG 84 09/21/2010   HDL 49 12/25/2011 0800   CHOLHDL 2.5 12/25/2011 0800   VLDL 8 12/25/2011 0800   LDLCALC 67 12/25/2011 0800   LDLCALC 125 09/21/2010      Wt Readings from Last 3 Encounters:  06/06/17 186 lb (84.4 kg)  05/05/17 178 lb (80.7 kg)  04/13/17 173 lb (78.5 kg)      Other studies Reviewed: Jan 2010 Echo SUMMARY - Overall left ventricular systolic function was normal. Left ventricular ejection fraction was estimated , range being 55 % to 60 %. There were no left ventricular regional wall motion abnormalities. Left ventricular wall thickness was mildly increased. There was moderate basal septal hypertrophy. - The aortic valve was mildly calcified. - There was mild fibrocalcific change of the aortic root. - The effective orifice of mitral regurgitation by proximal isovelocity surface area was 0.13 cm^2. The volume of mitral regurgitation by proximal isovelocity surface area was 19 cc. - The left atrium was moderately dilated. - There was mild right ventricular hypertrophy. - The right atrium was mild to moderately dilated.   ASSESSMENT AND PLAN:  1. Dizziness: His EKG demonstrated intermittent second  degree heart block. He is not on any AV nodal blocking agents. I will place a 7 day cardiac monitor on to evaluate him for further heart block with these symptoms.   2. CAD: No complaints of chest pain. Continue atorvastatin for secondary prevention.   3. Paroxysmal atrial flutter: EKG as above. He will continue on coumadin. His INR was 2.4 today.    Current medicines are reviewed at length with the patient today.    Labs/ tests ordered today include:CBC and cardiac monitor.   Phill Myron. West Pugh, ANP, AACC   06/06/2017 4:55 PM    [facility] [address], [place], [place] [address] Phone: [phone]; Fax: [phone]

## 2017-06-06 NOTE — Telephone Encounter (Signed)
Wife made apt for tomorrow with K.lawrence NP, per Alphonsus Sias

## 2017-06-07 ENCOUNTER — Ambulatory Visit (INDEPENDENT_AMBULATORY_CARE_PROVIDER_SITE_OTHER): Payer: Medicare HMO | Admitting: Urology

## 2017-06-07 ENCOUNTER — Ambulatory Visit: Payer: Medicare HMO | Admitting: Adult Health

## 2017-06-07 DIAGNOSIS — R42 Dizziness and giddiness: Secondary | ICD-10-CM | POA: Diagnosis not present

## 2017-06-07 DIAGNOSIS — C678 Malignant neoplasm of overlapping sites of bladder: Secondary | ICD-10-CM

## 2017-06-07 DIAGNOSIS — I4892 Unspecified atrial flutter: Secondary | ICD-10-CM | POA: Diagnosis not present

## 2017-06-07 LAB — CBC WITH DIFFERENTIAL/PLATELET
BASOS PCT: 0.6 %
Basophils Absolute: 32 cells/uL (ref 0–200)
EOS PCT: 2.1 %
Eosinophils Absolute: 111 cells/uL (ref 15–500)
HEMATOCRIT: 34.6 % — AB (ref 38.5–50.0)
HEMOGLOBIN: 10.6 g/dL — AB (ref 13.2–17.1)
LYMPHS ABS: 1463 {cells}/uL (ref 850–3900)
MCH: 25.6 pg — ABNORMAL LOW (ref 27.0–33.0)
MCHC: 30.6 g/dL — ABNORMAL LOW (ref 32.0–36.0)
MCV: 83.6 fL (ref 80.0–100.0)
MPV: 11.5 fL (ref 7.5–12.5)
Monocytes Relative: 8.6 %
NEUTROS ABS: 3238 {cells}/uL (ref 1500–7800)
Neutrophils Relative %: 61.1 %
Platelets: 259 10*3/uL (ref 140–400)
RBC: 4.14 10*6/uL — AB (ref 4.20–5.80)
RDW: 14 % (ref 11.0–15.0)
Total Lymphocyte: 27.6 %
WBC: 5.3 10*3/uL (ref 3.8–10.8)
WBCMIX: 456 {cells}/uL (ref 200–950)

## 2017-06-09 ENCOUNTER — Encounter (INDEPENDENT_AMBULATORY_CARE_PROVIDER_SITE_OTHER): Payer: Medicare HMO

## 2017-06-09 DIAGNOSIS — I480 Paroxysmal atrial fibrillation: Secondary | ICD-10-CM | POA: Diagnosis not present

## 2017-06-09 DIAGNOSIS — R42 Dizziness and giddiness: Secondary | ICD-10-CM

## 2017-06-09 DIAGNOSIS — I4892 Unspecified atrial flutter: Secondary | ICD-10-CM | POA: Diagnosis not present

## 2017-06-12 ENCOUNTER — Telehealth: Payer: Self-pay | Admitting: Internal Medicine

## 2017-06-12 DIAGNOSIS — R001 Bradycardia, unspecified: Secondary | ICD-10-CM

## 2017-06-12 NOTE — Telephone Encounter (Signed)
Patient has an appointment with me on 10/15 for follow up on his HR and symptoms. Given the abnormalities found on the cardiac monitor, see if he can be seen by Dr. Harl Bowie sooner. May need to refer to EP.

## 2017-06-12 NOTE — Telephone Encounter (Signed)
Attempted to call patient, no answer. Magda Paganini, MD Cardiology fellow

## 2017-06-12 NOTE — Telephone Encounter (Signed)
Called by [facility] mobile telemetry with critical EKG results: Pt with an episode of 2:1 AV block from 2:16 to approximately 2:42 am with ventricular rates as low as 30.  Telemetry service called patient and confirmed that patient was asleep, asymptomatic during this episode.  Review of records show several ECGs with atypical Wenkebach (Mobitz I AV block) and chronic bradycardia.  Pt of Dr. Harl Bowie, last seen on 06/06/17.  Notes mention that he has had episodes of sinus brady, occasional Wenkebach, nonconducted PACs, short pauses and HR generally in the 50s, but occasionally dipping to the 30s.  TTE with normal function.  BB stopped due to bradycaria  Was seen on 10/1 for dizziness.  Mobile telemetry ordered to evaluate to see if symptoms correlated with 2nd degree heart block incidents.  Given known history of Mobitz I AV block, asymptomatic nature of episode and occurrence during sleep, will not have patient come to ER at this time.  Asked mobile telemetry to re-contact if brady arrhythmias recur. Will contact in morning to reassess and have f/u as an outpatient with his providers.  [facility] providers here.  Magda Paganini, MD Cardiology fellow.

## 2017-06-12 NOTE — Telephone Encounter (Signed)
Shane [name] lease refer patient to EP for bradycardia   Zandra Abts MD

## 2017-06-13 ENCOUNTER — Telehealth: Payer: Self-pay | Admitting: *Deleted

## 2017-06-13 DIAGNOSIS — D508 Other iron deficiency anemias: Secondary | ICD-10-CM

## 2017-06-13 NOTE — Addendum Note (Signed)
Addended by: Levonne Hubert on: 06/13/2017 11:13 AM   Modules accepted: Orders

## 2017-06-13 NOTE — Telephone Encounter (Signed)
Called patient with test results. No answer. Left message to call back.  

## 2017-06-13 NOTE — Telephone Encounter (Signed)
-----   Message from Lendon Colonel, NP sent at 06/12/2017  6:17 PM EDT ----- Please get a stool hemoccult collected on this patient, Please send results to PCP as well. May need further evaluation of anemia. Would not stop coumadin at this time.

## 2017-06-14 ENCOUNTER — Ambulatory Visit (INDEPENDENT_AMBULATORY_CARE_PROVIDER_SITE_OTHER): Payer: Medicare HMO | Admitting: Urology

## 2017-06-14 ENCOUNTER — Other Ambulatory Visit (HOSPITAL_COMMUNITY)
Admission: RE | Admit: 2017-06-14 | Discharge: 2017-06-14 | Disposition: A | Discharge status: Home / Self Care | Payer: Medicare HMO | Source: Other Acute Inpatient Hospital | Attending: Adult Health | Admitting: Adult Health

## 2017-06-14 ENCOUNTER — Other Ambulatory Visit: Payer: Self-pay

## 2017-06-14 DIAGNOSIS — C678 Malignant neoplasm of overlapping sites of bladder: Secondary | ICD-10-CM | POA: Diagnosis not present

## 2017-06-14 DIAGNOSIS — K625 Hemorrhage of anus and rectum: Secondary | ICD-10-CM

## 2017-06-14 DIAGNOSIS — D508 Other iron deficiency anemias: Secondary | ICD-10-CM | POA: Insufficient documentation

## 2017-06-14 LAB — OCCULT BLOOD X 1 CARD TO LAB, STOOL: Fecal Occult Bld: NEGATIVE

## 2017-06-17 NOTE — Progress Notes (Signed)
Cardiology Office Note   Date:  06/20/2017   ID:  Shane [name], DOB Feb 18, 1933, MRN [id number]  PCP:  Asencion Noble, MD  Cardiologist:  Carlyle Dolly, MD  Chief Complaint  Patient presents with  . Palpitations  . Bradycardia    History of Present Illness: Shane [name] is a 83 y.o. male who presents for ongoing assessment and management of dizziness, with history of atrial flutter, CAD with last cath in 2008 with plans to be treated medically, hyperlipidemia. On last office visit EKG revealed an intermittent second-degree heart block. The patient is not on any AV nodal blocking agents. A 7 day cardiac monitor was placed to evaluate him for further heart block with symptoms of dizziness.  Telemetry monitoring company called our office on 06/12/2017 with a abnormal EKG reading revealing an episode of 2-1 AV block with ventricular response of 30 bpm. The patient was referred to EP for evaluation for pacemaker placement. On 07/25/2017 with Dr. Lovena Le. He was advised not to drive.  This is a brief office visit to inform him of the results of the cardiac monitor and his scheduled appointment with Dr. Lovena Le.   Past Medical History:  Diagnosis Date  . Arteriosclerotic cardiovascular disease (ASCVD)    CABG surgery in 1990. MI in 1986; coronary angiography in 2003-critical LAD with patent LIMA; total obstruction of the RCA with patent RIMA; low normal EF.  [name] Atrial flutter, paroxysmal ([place])    asymptomatic; onset in 2010; 4:1 AVB with low-dose metoprolol; moderate left      atrial enlargement and mild left ventricular hypertrophy with normal ejection fraction by echocardiography  . Benign prostatic hypertrophy   . Bradycardia    a. requiring cessation of beta blocker 12/2016.  [name] Chronic anticoagulation   . Hyperlipidemia    Lipid profile in 09/2010:196, 84, 54, 125  . Mobitz type 1 second degree atrioventricular block 12/17/2016  . Nephrolithiasis   . Polymyalgia rheumatica (HCC)      Past Surgical History:  Procedure Laterality Date  . COLONOSCOPY  08/21/01   RMR: Left-sided diverticulum.  Remainder of colonic mucosa appeared normal  . COLONOSCOPY N/A 07/31/2013   Procedure: COLONOSCOPY;  Surgeon: Danie Binder, MD;  [facility];  Service: Endoscopy;  Laterality: N/A;  10:30  . CORONARY ARTERY BYPASS GRAFT  1990  . LUMBAR DISC SURGERY    . TRANSURETHRAL RESECTION OF BLADDER TUMOR N/A 03/01/2017   Procedure: TRANSURETHRAL RESECTION OF BLADDER TUMOR (TURBT);  Surgeon: Franchot Gallo, MD;  Location: [facility];  Service: Urology;  Laterality: N/A;  . TRANSURETHRAL RESECTION OF BLADDER TUMOR N/A 04/12/2017   Procedure: REPEAT TRANSURETHRAL RESECTION OF BLADDER TUMOR (TURBT);  Surgeon: Franchot Gallo, MD;  Location: [facility];  Service: Urology;  Laterality: N/A;  1 HR [phone] [employer] [place]     Current Outpatient Prescriptions  Medication Sig Dispense Refill  . acetaminophen (TYLENOL) 500 MG tablet Take 1,000 mg by mouth every 8 (eight) hours as needed for mild pain.    [name] atorvastatin (LIPITOR) 80 MG tablet Take 1 tablet (80 mg total) by mouth daily. (Patient taking differently: Take 80 mg by mouth every evening. ) 90 tablet 3  . Tamsulosin HCl (FLOMAX) 0.4 MG CAPS Take 0.4 mg by mouth daily after supper.     . warfarin (COUMADIN) 5 MG tablet Take 7.5 mg by mouth daily.    [name] warfarin (COUMADIN) 5 MG tablet TAKE ONE & ONE-HALF TABLETS BY MOUTH ONCE DAILY OR  AS  DIRECTED  135 tablet 3   No current facility-administered medications for this visit.     Allergies:   Patient has no known allergies.    Social History:  The patient  reports that he quit smoking about 34 years ago. He has a 37.50 pack-year smoking history. He has never used smokeless tobacco. He reports that he drinks alcohol. He reports that he does not use drugs.   Family History:  The patient's family history includes Coronary artery disease in his brother; Heart attack in  his mother; Pneumonia in his brother.    ROS: All other systems are reviewed and negative. Unless otherwise mentioned in H&P    PHYSICAL EXAM: VS:  There were no vitals taken for this visit. , BMI There is no height or weight on file to calculate BMI. GEN: Well nourished, well developed, in no acute distress  HEENT: normal  Neck: no JVD, carotid bruits, or masses Cardiac: RRR; no murmurs, rubs, or gallops,no edema  Respiratory:  clear to auscultation bilaterally, normal work of breathing Psych: euthymic mood, full affect   Recent Labs: 12/16/2016: TSH 0.651 04/07/2017: BUN 17; Creatinine, Ser 0.60; Potassium 4.4; Sodium 138 06/07/2017: Hemoglobin 10.6; Platelets 259    Lipid Panel    Component Value Date/Time   CHOL 124 12/25/2011 0800   TRIG 40 12/25/2011 0800   TRIG 84 09/21/2010   HDL 49 12/25/2011 0800   CHOLHDL 2.5 12/25/2011 0800   VLDL 8 12/25/2011 0800   LDLCALC 67 12/25/2011 0800   LDLCALC 125 09/21/2010      Wt Readings from Last 3 Encounters:  06/06/17 186 lb (84.4 kg)  05/05/17 178 lb (80.7 kg)  04/13/17 173 lb (78.5 kg)     ASSESSMENT AND PLAN:  1. Second degree AV block with associated bradycardia and pauses: He is referred to Dr. Lovena Le and is to see him on July 24, 2017. He agrees to be seen. He is not currently on any AV nodal blocking agents and is asymptomatic at this time with the exception of mild dizziness. . Await recommendations.   2. Atrial flutter: Continues on coumadin therapy. No heart rate control is indicated at this time.   3. CAD: CABG in 1990, with cardiac cath 2008 critical LAD with patent LIMA; total obstruction of the RCA with patent RIMA.Treated medically with secondary prevention using statin.   4. Hyperlipidemia: Continue atorvastatin as directed.   Current medicines are reviewed at length with the patient today.    Labs/ tests ordered today include: None  Phill Myron. West Pugh, ANP, AACC   06/20/2017 3:12 PM    [facility] [address] [place], [place] [address] Phone: [phone]; Fax: [phone]

## 2017-06-20 ENCOUNTER — Encounter: Payer: Self-pay | Admitting: Adult Health

## 2017-06-20 ENCOUNTER — Ambulatory Visit (INDEPENDENT_AMBULATORY_CARE_PROVIDER_SITE_OTHER): Payer: Medicare HMO | Admitting: *Deleted

## 2017-06-20 ENCOUNTER — Ambulatory Visit (INDEPENDENT_AMBULATORY_CARE_PROVIDER_SITE_OTHER): Payer: Medicare HMO | Admitting: Adult Health

## 2017-06-20 VITALS — BP 136/74 | HR 69 | Ht 68.0 in | Wt 182.0 lb

## 2017-06-20 DIAGNOSIS — I441 Atrioventricular block, second degree: Secondary | ICD-10-CM

## 2017-06-20 DIAGNOSIS — Z7901 Long term (current) use of anticoagulants: Secondary | ICD-10-CM

## 2017-06-20 DIAGNOSIS — I4892 Unspecified atrial flutter: Secondary | ICD-10-CM

## 2017-06-20 DIAGNOSIS — R42 Dizziness and giddiness: Secondary | ICD-10-CM

## 2017-06-20 DIAGNOSIS — I10 Essential (primary) hypertension: Secondary | ICD-10-CM

## 2017-06-20 DIAGNOSIS — Z5181 Encounter for therapeutic drug level monitoring: Secondary | ICD-10-CM

## 2017-06-20 LAB — POCT INR: INR: 2.5

## 2017-06-20 NOTE — Patient Instructions (Signed)
Medication Instructions:  Your physician recommends that you continue on your current medications as directed. Please refer to the Current Medication list given to you today.   Labwork: NONE  Testing/Procedures: NONE  Follow-Up: Your physician recommends that you schedule a follow-up appointment in: [place]   Any Other Special Instructions Will Be Listed Below (If Applicable).     If you need a refill on your cardiac medications before your next appointment, please call your pharmacy.

## 2017-06-21 ENCOUNTER — Ambulatory Visit (INDEPENDENT_AMBULATORY_CARE_PROVIDER_SITE_OTHER): Payer: Medicare HMO | Admitting: Urology

## 2017-06-21 DIAGNOSIS — C678 Malignant neoplasm of overlapping sites of bladder: Secondary | ICD-10-CM

## 2017-07-18 ENCOUNTER — Ambulatory Visit (INDEPENDENT_AMBULATORY_CARE_PROVIDER_SITE_OTHER): Payer: Medicare HMO | Admitting: *Deleted

## 2017-07-18 DIAGNOSIS — Z7901 Long term (current) use of anticoagulants: Secondary | ICD-10-CM

## 2017-07-18 DIAGNOSIS — I4892 Unspecified atrial flutter: Secondary | ICD-10-CM

## 2017-07-18 DIAGNOSIS — Z5181 Encounter for therapeutic drug level monitoring: Secondary | ICD-10-CM | POA: Diagnosis not present

## 2017-07-18 LAB — POCT INR: INR: 2.4

## 2017-07-25 ENCOUNTER — Ambulatory Visit: Payer: Medicare HMO | Admitting: Internal Medicine

## 2017-07-25 ENCOUNTER — Encounter: Payer: Self-pay | Admitting: Internal Medicine

## 2017-07-25 VITALS — BP 144/80 | HR 63 | Ht 68.0 in | Wt 185.8 lb

## 2017-07-25 DIAGNOSIS — I441 Atrioventricular block, second degree: Secondary | ICD-10-CM

## 2017-07-25 NOTE — Progress Notes (Signed)
HPI Shane [name] is referred today by Shane [name] for evaluation of heart block. He is a very pleasant 83 year old man with a history of bladder cancer, remote coronary artery disease status post MI, atrial flutter, atrial fibrillation, who was noted to have 2-1 heart block on a 24-hour Holter monitor. The patient remains active. He walks 2 miles or more a day. He denies anginal symptoms and has no shortness of breath or peripheral edema. He has never had syncope. When carefully questioned, he denies any symptoms associated with bradycardia. He does have AV Wenkebach at times. This appears to be completely asymptomatic. No Known Allergies   Current Outpatient Medications  Medication Sig Dispense Refill  . acetaminophen (TYLENOL) 500 MG tablet Take 1,000 mg by mouth every 8 (eight) hours as needed for mild pain.    [name] atorvastatin (LIPITOR) 80 MG tablet Take 1 tablet (80 mg total) by mouth daily. (Patient taking differently: Take 80 mg by mouth every evening. ) 90 tablet 3  . Tamsulosin HCl (FLOMAX) 0.4 MG CAPS Take 0.4 mg by mouth daily after supper.     . warfarin (COUMADIN) 5 MG tablet TAKE ONE & ONE-HALF TABLETS BY MOUTH ONCE DAILY OR  AS  DIRECTED 135 tablet 3   No current facility-administered medications for this visit.      Past Medical History:  Diagnosis Date  . Arteriosclerotic cardiovascular disease (ASCVD)    CABG surgery in 1990. MI in 1986; coronary angiography in 2003-critical LAD with patent LIMA; total obstruction of the RCA with patent RIMA; low normal EF.  [name] Atrial flutter, paroxysmal ([place])    asymptomatic; onset in 2010; 4:1 AVB with low-dose metoprolol; moderate left      atrial enlargement and mild left ventricular hypertrophy with normal ejection fraction by echocardiography  . Benign prostatic hypertrophy   . Bradycardia    a. requiring cessation of beta blocker 12/2016.  [name] Chronic anticoagulation   . Hyperlipidemia    Lipid profile in 09/2010:196, 84, 54,  125  . Mobitz type 1 second degree atrioventricular block 12/17/2016  . Nephrolithiasis   . Polymyalgia rheumatica (HCC)     ROS:   All systems reviewed and negative except as noted in the HPI.   Past Surgical History:  Procedure Laterality Date  . COLONOSCOPY  08/21/01   RMR: Left-sided diverticulum.  Remainder of colonic mucosa appeared normal  . COLONOSCOPY N/A 07/31/2013   Performed by Danie Binder, MD at [facility]  . CORONARY ARTERY BYPASS GRAFT  1990  . LUMBAR DISC SURGERY    . REPEAT TRANSURETHRAL RESECTION OF BLADDER TUMOR (TURBT) N/A 04/12/2017   Performed by Franchot Gallo, MD at [facility]  . TRANSURETHRAL RESECTION OF BLADDER TUMOR (TURBT) N/A 03/01/2017   Performed by Franchot Gallo, MD at [facility]     Family History  Problem Relation Age of Onset  . Heart attack Mother   . Coronary artery disease Brother        CABG + pacemaker  . Pneumonia Brother        infant death  . Colon cancer Neg Hx   . Colon polyps Neg Hx      Social History   Socioeconomic History  . Marital status: Married    Spouse name: Not on file  . Number of children: 3  . Years of education: Not on file  . Highest education level: Not on file  Social Needs  . Financial resource strain: Not on  file  . Food insecurity - worry: Not on file  . Food insecurity - inability: Not on file  . Transportation needs - medical: Not on file  . Transportation needs - non-medical: Not on file  Occupational History  . Occupation: Retail  Tobacco Use  . Smoking status: Former Smoker    Packs/day: 1.50    Years: 25.00    Pack years: 37.50    Last attempt to quit: 10/07/1982    Years since quitting: 34.8  . Smokeless tobacco: Never Used  Substance and Sexual Activity  . Alcohol use: Yes    Comment: 1 bottle of wine weekly; occasional beer  . Drug use: No  . Sexual activity: Not on file  Other Topics Concern  . Not on file  Social History Narrative  . Not on file     BP (!) 144/80    Pulse 63   Ht 5\' 8"  (1.727 m)   Wt 185 lb 12.8 oz (84.3 kg)   SpO2 95% Comment: on room air  BMI 28.25 kg/m   Physical Exam:  Well appearing 83 year old man, NAD HEENT: Unremarkable Neck:  6 cm JVD, no thyromegally Lymphatics:  No adenopathy Back:  No CVA tenderness Lungs:  Clear, with no wheezes, rales, or rhonchi. HEART:  Regular rate rhythm, no murmurs, no rubs, no clicks Abd:  soft, positive bowel sounds, no organomegally, no rebound, no guarding Ext:  2 plus pulses, no edema, no cyanosis, no clubbing Skin:  No rashes no nodules Neuro:  CN II through XII intact, motor grossly intact  EKG - reviewed, sinus rhythm with right bundle branch block and first-degree AV block  Assess/Plan: 1. AV Wenckebach - the patient is asymptomatic. I discussed the symptoms that he might experience if he were to develop symptomatic heart block, and he will undergo watchful waiting. 2. Coronary artery disease - the patient has no anginal symptoms despite being fairly active. 3. Paroxysmal atrial arrhythmias - he is asymptomatic and is on anticoagulation. No change in medications.  Cristopher Peru, M.D.

## 2017-07-25 NOTE — Patient Instructions (Signed)
Medication Instructions:  Your physician recommends that you continue on your current medications as directed. Please refer to the Current Medication list given to you today.   Labwork: NONE   Testing/Procedures: NONE   Follow-Up: Your physician recommends that you schedule a follow-up appointment As Needed    Any Other Special Instructions Will Be Listed Below (If Applicable).     If you need a refill on your cardiac medications before your next appointment, please call your pharmacy.  Thank you for choosing [facility]!   

## 2017-08-02 ENCOUNTER — Ambulatory Visit: Payer: Medicare HMO | Admitting: Urology

## 2017-08-02 ENCOUNTER — Other Ambulatory Visit (HOSPITAL_COMMUNITY)
Admission: AD | Admit: 2017-08-02 | Discharge: 2017-08-02 | Disposition: A | Discharge status: Home / Self Care | Payer: Medicare HMO | Source: Skilled Nursing Facility | Attending: Urology | Admitting: Urology

## 2017-08-02 ENCOUNTER — Other Ambulatory Visit (HOSPITAL_COMMUNITY)
Admission: RE | Admit: 2017-08-02 | Discharge: 2017-08-02 | Disposition: A | Discharge status: Home / Self Care | Payer: Medicare HMO | Source: Ambulatory Visit | Attending: Urology | Admitting: Urology

## 2017-08-02 DIAGNOSIS — C678 Malignant neoplasm of overlapping sites of bladder: Secondary | ICD-10-CM | POA: Insufficient documentation

## 2017-08-02 DIAGNOSIS — R82998 Other abnormal findings in urine: Secondary | ICD-10-CM | POA: Diagnosis not present

## 2017-08-22 ENCOUNTER — Ambulatory Visit (INDEPENDENT_AMBULATORY_CARE_PROVIDER_SITE_OTHER): Payer: Medicare HMO | Admitting: *Deleted

## 2017-08-22 DIAGNOSIS — Z5181 Encounter for therapeutic drug level monitoring: Secondary | ICD-10-CM

## 2017-08-22 DIAGNOSIS — I4892 Unspecified atrial flutter: Secondary | ICD-10-CM

## 2017-08-22 DIAGNOSIS — Z7901 Long term (current) use of anticoagulants: Secondary | ICD-10-CM | POA: Diagnosis not present

## 2017-08-22 LAB — POCT INR: INR: 2.3

## 2017-09-13 ENCOUNTER — Ambulatory Visit (INDEPENDENT_AMBULATORY_CARE_PROVIDER_SITE_OTHER): Payer: Medicare HMO | Admitting: Urology

## 2017-09-13 DIAGNOSIS — C678 Malignant neoplasm of overlapping sites of bladder: Secondary | ICD-10-CM

## 2017-09-20 ENCOUNTER — Ambulatory Visit (INDEPENDENT_AMBULATORY_CARE_PROVIDER_SITE_OTHER): Payer: Medicare HMO | Admitting: Urology

## 2017-09-20 DIAGNOSIS — C678 Malignant neoplasm of overlapping sites of bladder: Secondary | ICD-10-CM

## 2017-09-21 DIAGNOSIS — E785 Hyperlipidemia, unspecified: Secondary | ICD-10-CM | POA: Diagnosis not present

## 2017-09-21 DIAGNOSIS — I4891 Unspecified atrial fibrillation: Secondary | ICD-10-CM | POA: Diagnosis not present

## 2017-09-21 DIAGNOSIS — Z7901 Long term (current) use of anticoagulants: Secondary | ICD-10-CM | POA: Diagnosis not present

## 2017-09-21 DIAGNOSIS — I252 Old myocardial infarction: Secondary | ICD-10-CM | POA: Diagnosis not present

## 2017-09-21 DIAGNOSIS — Z833 Family history of diabetes mellitus: Secondary | ICD-10-CM | POA: Diagnosis not present

## 2017-09-21 DIAGNOSIS — I2510 Atherosclerotic heart disease of native coronary artery without angina pectoris: Secondary | ICD-10-CM | POA: Diagnosis not present

## 2017-09-21 DIAGNOSIS — Z809 Family history of malignant neoplasm, unspecified: Secondary | ICD-10-CM | POA: Diagnosis not present

## 2017-09-21 DIAGNOSIS — Z8379 Family history of other diseases of the digestive system: Secondary | ICD-10-CM | POA: Diagnosis not present

## 2017-09-21 DIAGNOSIS — C61 Malignant neoplasm of prostate: Secondary | ICD-10-CM | POA: Diagnosis not present

## 2017-09-21 DIAGNOSIS — Z8249 Family history of ischemic heart disease and other diseases of the circulatory system: Secondary | ICD-10-CM | POA: Diagnosis not present

## 2017-09-27 ENCOUNTER — Ambulatory Visit (INDEPENDENT_AMBULATORY_CARE_PROVIDER_SITE_OTHER): Payer: Medicare HMO | Admitting: Urology

## 2017-09-27 DIAGNOSIS — C678 Malignant neoplasm of overlapping sites of bladder: Secondary | ICD-10-CM

## 2017-10-03 ENCOUNTER — Ambulatory Visit (INDEPENDENT_AMBULATORY_CARE_PROVIDER_SITE_OTHER): Payer: Medicare HMO | Admitting: *Deleted

## 2017-10-03 DIAGNOSIS — Z7901 Long term (current) use of anticoagulants: Secondary | ICD-10-CM

## 2017-10-03 DIAGNOSIS — I4892 Unspecified atrial flutter: Secondary | ICD-10-CM | POA: Diagnosis not present

## 2017-10-03 DIAGNOSIS — Z5181 Encounter for therapeutic drug level monitoring: Secondary | ICD-10-CM

## 2017-10-03 LAB — POCT INR: INR: 2.5

## 2017-10-03 NOTE — Patient Instructions (Signed)
Continue coumadin 1 1/2 tablets daily  Continue greens Recheck in 6 weeks

## 2017-11-14 ENCOUNTER — Ambulatory Visit (INDEPENDENT_AMBULATORY_CARE_PROVIDER_SITE_OTHER): Payer: Medicare HMO | Admitting: *Deleted

## 2017-11-14 DIAGNOSIS — I4892 Unspecified atrial flutter: Secondary | ICD-10-CM | POA: Diagnosis not present

## 2017-11-14 DIAGNOSIS — Z5181 Encounter for therapeutic drug level monitoring: Secondary | ICD-10-CM | POA: Diagnosis not present

## 2017-11-14 LAB — POCT INR: INR: 1.8

## 2017-11-14 NOTE — Patient Instructions (Signed)
Take coumadin 2 tablets tonight and tomorrow night then resume 1 1/2 tablets daily  Continue greens Recheck in 4 weeks

## 2017-12-06 ENCOUNTER — Other Ambulatory Visit (HOSPITAL_COMMUNITY)
Admission: RE | Admit: 2017-12-06 | Discharge: 2017-12-06 | Disposition: A | Discharge status: Home / Self Care | Payer: Medicare HMO | Source: Ambulatory Visit | Attending: Urology | Admitting: Urology

## 2017-12-06 ENCOUNTER — Ambulatory Visit: Payer: Medicare HMO | Admitting: Urology

## 2017-12-06 DIAGNOSIS — C678 Malignant neoplasm of overlapping sites of bladder: Secondary | ICD-10-CM | POA: Insufficient documentation

## 2017-12-06 DIAGNOSIS — N401 Enlarged prostate with lower urinary tract symptoms: Secondary | ICD-10-CM

## 2017-12-06 DIAGNOSIS — R351 Nocturia: Secondary | ICD-10-CM | POA: Diagnosis not present

## 2017-12-12 ENCOUNTER — Ambulatory Visit (INDEPENDENT_AMBULATORY_CARE_PROVIDER_SITE_OTHER): Payer: Medicare HMO | Admitting: *Deleted

## 2017-12-12 DIAGNOSIS — I4892 Unspecified atrial flutter: Secondary | ICD-10-CM | POA: Diagnosis not present

## 2017-12-12 DIAGNOSIS — Z5181 Encounter for therapeutic drug level monitoring: Secondary | ICD-10-CM

## 2017-12-12 LAB — POCT INR: INR: 2.5

## 2017-12-12 NOTE — Patient Instructions (Signed)
Continue coumadin 1 1/2 tablets daily  Continue greens Recheck in 4 weeks

## 2017-12-20 ENCOUNTER — Ambulatory Visit (INDEPENDENT_AMBULATORY_CARE_PROVIDER_SITE_OTHER): Payer: Medicare HMO | Admitting: Urology

## 2017-12-20 DIAGNOSIS — C678 Malignant neoplasm of overlapping sites of bladder: Secondary | ICD-10-CM

## 2017-12-27 ENCOUNTER — Ambulatory Visit (INDEPENDENT_AMBULATORY_CARE_PROVIDER_SITE_OTHER): Payer: Medicare HMO | Admitting: Urology

## 2017-12-27 DIAGNOSIS — C678 Malignant neoplasm of overlapping sites of bladder: Secondary | ICD-10-CM | POA: Diagnosis not present

## 2018-01-03 ENCOUNTER — Ambulatory Visit (INDEPENDENT_AMBULATORY_CARE_PROVIDER_SITE_OTHER): Payer: Medicare HMO | Admitting: Urology

## 2018-01-03 DIAGNOSIS — C678 Malignant neoplasm of overlapping sites of bladder: Secondary | ICD-10-CM | POA: Diagnosis not present

## 2018-01-09 ENCOUNTER — Ambulatory Visit (INDEPENDENT_AMBULATORY_CARE_PROVIDER_SITE_OTHER): Payer: Medicare HMO | Admitting: *Deleted

## 2018-01-09 DIAGNOSIS — I4892 Unspecified atrial flutter: Secondary | ICD-10-CM

## 2018-01-09 DIAGNOSIS — Z5181 Encounter for therapeutic drug level monitoring: Secondary | ICD-10-CM | POA: Diagnosis not present

## 2018-01-09 LAB — POCT INR: INR: 2

## 2018-01-09 NOTE — Patient Instructions (Signed)
Increase coumadin to 1 1/2 tablets daily except 2 tablets on Mondays and Thursdays Continue greens Recheck in 4 weeks

## 2018-02-06 ENCOUNTER — Ambulatory Visit (INDEPENDENT_AMBULATORY_CARE_PROVIDER_SITE_OTHER): Payer: Medicare HMO | Admitting: *Deleted

## 2018-02-06 DIAGNOSIS — Z5181 Encounter for therapeutic drug level monitoring: Secondary | ICD-10-CM

## 2018-02-06 DIAGNOSIS — I4892 Unspecified atrial flutter: Secondary | ICD-10-CM

## 2018-02-06 LAB — POCT INR: INR: 3.5 — AB (ref 2.0–3.0)

## 2018-02-06 NOTE — Patient Instructions (Signed)
Hold coumadin tonight then decrease dose to 1 1/2 tablets daily except 2 tablets on Thursdays Restart greens as before Recheck in 3 weeks

## 2018-02-21 ENCOUNTER — Other Ambulatory Visit: Payer: Self-pay | Admitting: Cardiology

## 2018-03-03 ENCOUNTER — Ambulatory Visit (INDEPENDENT_AMBULATORY_CARE_PROVIDER_SITE_OTHER): Payer: Medicare HMO | Admitting: *Deleted

## 2018-03-03 DIAGNOSIS — Z5181 Encounter for therapeutic drug level monitoring: Secondary | ICD-10-CM | POA: Diagnosis not present

## 2018-03-03 DIAGNOSIS — I4892 Unspecified atrial flutter: Secondary | ICD-10-CM

## 2018-03-03 LAB — POCT INR: INR: 3.7 — AB (ref 2.0–3.0)

## 2018-03-03 NOTE — Patient Instructions (Signed)
Hold coumadin tonight then decrease dose to 1 1/2 tablets daily except 1 tablets on Thursdays Continue greens as before Recheck in 3 weeks

## 2018-03-24 ENCOUNTER — Ambulatory Visit (INDEPENDENT_AMBULATORY_CARE_PROVIDER_SITE_OTHER): Payer: Medicare HMO | Admitting: *Deleted

## 2018-03-24 DIAGNOSIS — Z5181 Encounter for therapeutic drug level monitoring: Secondary | ICD-10-CM

## 2018-03-24 DIAGNOSIS — I4892 Unspecified atrial flutter: Secondary | ICD-10-CM

## 2018-03-24 LAB — POCT INR: INR: 2 (ref 2.0–3.0)

## 2018-03-24 NOTE — Patient Instructions (Signed)
Take coumadin 2 tablets tonight then increase dose to 1 1/2 tablets daily Decrease greens by 1 serving per week Recheck in 4 weeks

## 2018-04-18 ENCOUNTER — Other Ambulatory Visit (HOSPITAL_COMMUNITY)
Admission: RE | Admit: 2018-04-18 | Discharge: 2018-04-18 | Disposition: A | Discharge status: Home / Self Care | Payer: Medicare HMO | Source: Ambulatory Visit | Attending: Urology | Admitting: Urology

## 2018-04-18 ENCOUNTER — Ambulatory Visit: Payer: Medicare HMO | Admitting: Urology

## 2018-04-18 DIAGNOSIS — C678 Malignant neoplasm of overlapping sites of bladder: Secondary | ICD-10-CM | POA: Diagnosis not present

## 2018-04-28 ENCOUNTER — Ambulatory Visit: Payer: Medicare HMO | Admitting: *Deleted

## 2018-04-28 DIAGNOSIS — I4892 Unspecified atrial flutter: Secondary | ICD-10-CM | POA: Diagnosis not present

## 2018-04-28 DIAGNOSIS — Z5181 Encounter for therapeutic drug level monitoring: Secondary | ICD-10-CM | POA: Diagnosis not present

## 2018-04-28 LAB — POCT INR: INR: 2.7 (ref 2.0–3.0)

## 2018-04-28 NOTE — Patient Instructions (Addendum)
Description   Continue taking  1 1/2 tablets daily.  Recheck in 4 weeks

## 2018-05-03 ENCOUNTER — Ambulatory Visit (INDEPENDENT_AMBULATORY_CARE_PROVIDER_SITE_OTHER): Payer: Medicare HMO | Admitting: Urology

## 2018-05-03 DIAGNOSIS — C678 Malignant neoplasm of overlapping sites of bladder: Secondary | ICD-10-CM | POA: Diagnosis not present

## 2018-05-10 ENCOUNTER — Ambulatory Visit (INDEPENDENT_AMBULATORY_CARE_PROVIDER_SITE_OTHER): Payer: Medicare HMO | Admitting: Urology

## 2018-05-10 DIAGNOSIS — C678 Malignant neoplasm of overlapping sites of bladder: Secondary | ICD-10-CM | POA: Diagnosis not present

## 2018-05-17 ENCOUNTER — Ambulatory Visit (INDEPENDENT_AMBULATORY_CARE_PROVIDER_SITE_OTHER): Payer: Medicare HMO | Admitting: Urology

## 2018-05-17 DIAGNOSIS — C678 Malignant neoplasm of overlapping sites of bladder: Secondary | ICD-10-CM

## 2018-05-22 ENCOUNTER — Other Ambulatory Visit: Payer: Self-pay | Admitting: Cardiology

## 2018-05-25 ENCOUNTER — Ambulatory Visit: Payer: Medicare HMO | Admitting: Cardiology

## 2018-05-25 ENCOUNTER — Encounter: Payer: Self-pay | Admitting: Cardiology

## 2018-05-25 VITALS — BP 132/72 | HR 62 | Ht 68.5 in | Wt 175.0 lb

## 2018-05-25 DIAGNOSIS — I441 Atrioventricular block, second degree: Secondary | ICD-10-CM | POA: Diagnosis not present

## 2018-05-25 DIAGNOSIS — I2510 Atherosclerotic heart disease of native coronary artery without angina pectoris: Secondary | ICD-10-CM | POA: Diagnosis not present

## 2018-05-25 DIAGNOSIS — I4892 Unspecified atrial flutter: Secondary | ICD-10-CM

## 2018-05-25 DIAGNOSIS — E782 Mixed hyperlipidemia: Secondary | ICD-10-CM | POA: Diagnosis not present

## 2018-05-25 NOTE — Patient Instructions (Signed)

## 2018-05-25 NOTE — Progress Notes (Signed)
Clinical Summary Mr. Coe is a 84 y.o.male seen today for follow up of the following medical problems.   1. CAD - prior CABG in 1990 - last cath 2003 as reported below. - Jan 2010 echo LVEF 55-60%  - no recent chest pain, SOB/DOE - compliant with meds - walks 3 miles a day without troubles.   2. Hyperlipidemia - 10/2016 TC 124 TG 54 HDL 62 LDL 51 - compliant with statin - upcoming labs with pcp  3. Aflutter - no recent palpitations - intermittent low heart rates. EKG 8/7 showed sinus brady, wenchebach block.   - no palpitations. No lightheadedness or dizziness.    5. Chronic bradycardia/AV block - 2:1 AV block noted on holter. FOllowed by EP for mobitz type I undergoing watchful waiting.  - no recent symptoms.    SH: married 42 years. Son is an ob/gyn in [employer], another son is a English as a second language teacher who lived in [place] 5 years moving to [place].     Past Medical History:  Diagnosis Date  . Arteriosclerotic cardiovascular disease (ASCVD)    CABG surgery in 1990. MI in 1986; coronary angiography in 2003-critical LAD with patent LIMA; total obstruction of the RCA with patent RIMA; low normal EF.  [name] Atrial flutter, paroxysmal ([place])    asymptomatic; onset in 2010; 4:1 AVB with low-dose metoprolol; moderate left      atrial enlargement and mild left ventricular hypertrophy with normal ejection fraction by echocardiography  . Benign prostatic hypertrophy   . Bradycardia    a. requiring cessation of beta blocker 12/2016.  [name] Chronic anticoagulation   . Hyperlipidemia    Lipid profile in 09/2010:196, 84, 54, 125  . Mobitz type 1 second degree atrioventricular block 12/17/2016  . Nephrolithiasis   . Polymyalgia rheumatica (HCC)      No Known Allergies   Current Outpatient Medications  Medication Sig Dispense Refill  . acetaminophen (TYLENOL) 500 MG tablet Take 1,000 mg by mouth every 8 (eight) hours as needed for mild pain.    [name] atorvastatin (LIPITOR) 80 MG tablet Take 1  tablet (80 mg total) by mouth every evening. 90 tablet 3  . Tamsulosin HCl (FLOMAX) 0.4 MG CAPS Take 0.4 mg by mouth daily after supper.     . warfarin (COUMADIN) 5 MG tablet TAKE 1 & 1/2 (ONE & ONE-HALF) TABLETS BY MOUTH ONCE DAILY OR  AS  DIRECTED 135 tablet 3   No current facility-administered medications for this visit.      Past Surgical History:  Procedure Laterality Date  . COLONOSCOPY  08/21/01   RMR: Left-sided diverticulum.  Remainder of colonic mucosa appeared normal  . COLONOSCOPY N/A 07/31/2013   Procedure: COLONOSCOPY;  Surgeon: Danie Binder, MD;  [facility];  Service: Endoscopy;  Laterality: N/A;  10:30  . CORONARY ARTERY BYPASS GRAFT  1990  . LUMBAR DISC SURGERY    . TRANSURETHRAL RESECTION OF BLADDER TUMOR N/A 03/01/2017   Procedure: TRANSURETHRAL RESECTION OF BLADDER TUMOR (TURBT);  Surgeon: Franchot Gallo, MD;  Location: [facility];  Service: Urology;  Laterality: N/A;  . TRANSURETHRAL RESECTION OF BLADDER TUMOR N/A 04/12/2017   Procedure: REPEAT TRANSURETHRAL RESECTION OF BLADDER TUMOR (TURBT);  Surgeon: Franchot Gallo, MD;  Location: [facility];  Service: Urology;  Laterality: N/A;  1 HR [phone] [employer] [place]     No Known Allergies    Family History  Problem Relation Age of Onset  . Heart attack Mother   . Coronary artery disease Brother  CABG + pacemaker  . Pneumonia Brother        infant death  . Colon cancer Neg Hx   . Colon polyps Neg Hx      Social History Mr. Reihl reports that he quit smoking about 35 years ago. He has a 37.50 pack-year smoking history. He has never used smokeless tobacco. Mr. Tejera reports that he drinks alcohol.   Review of Systems CONSTITUTIONAL: No weight loss, fever, chills, weakness or fatigue.  HEENT: Eyes: No visual loss, blurred vision, double vision or yellow sclerae.No hearing loss, sneezing, congestion, runny nose or sore throat.  SKIN: No rash or itching.  CARDIOVASCULAR: per  hpi RESPIRATORY: No shortness of breath, cough or sputum.  GASTROINTESTINAL: No anorexia, nausea, vomiting or diarrhea. No abdominal pain or blood.  GENITOURINARY: No burning on urination, no polyuria NEUROLOGICAL: No headache, dizziness, syncope, paralysis, ataxia, numbness or tingling in the extremities. No change in bowel or bladder control.  MUSCULOSKELETAL: No muscle, back pain, joint pain or stiffness.  LYMPHATICS: No enlarged nodes. No history of splenectomy.  PSYCHIATRIC: No history of depression or anxiety.  ENDOCRINOLOGIC: No reports of sweating, cold or heat intolerance. No polyuria or polydipsia.  [name]   Physical Examination Vitals:   05/25/18 1305  BP: 132/72  Pulse: 62  SpO2: 97%   Vitals:   05/25/18 1305  Weight: 175 lb (79.4 kg)  Height: 5' 8.5" (1.74 m)    Gen: resting comfortably, no acute distress HEENT: no scleral icterus, pupils equal round and reactive, no palptable cervical adenopathy,  CV: RRR, no m/r/g, no jvd Resp: Clear to auscultation bilaterally GI: abdomen is soft, non-tender, non-distended, normal bowel sounds, no hepatosplenomegaly MSK: extremities are warm, no edema.  Skin: warm, no rash Neuro:  no focal deficits Psych: appropriate affect   Diagnostic Studies Cath 07/2002 FINDINGS:  1. Left main trunk. Medium caliber vessel with mild ________.  2. LAD. This is a medium caliber vessel which supplies a trivial first  diagonal Prentiss Polio proximal segment, medium caliber second diagonal Glenis Musolf  thereafter. The LAD then extends to the apex. The LAD has moderate  disease, 50% of the proximal left segment, which then extends into a  narrowing of 70% encompassing the trivial diagonal Kennis Wissmann. The distal  LAD has mild irregularities and is seen to fill predominantly via the  LIMA graft. There is mid narrowing of 30%. The second diagonal Ayelen Sciortino  has an ostial narrowing of 50% and fills predominantly via antegrade  flow.  3. Left circumflex  artery. This is a medium caliber vessel that supplies a  small first marginal Raad Clayson and the proximal segment a larger second  marginal Kellen Dutch. In the mid-section, there is moderate narrowing of 30-  40% of the proximal segment of the second marginal Johnna Bollier.  4. Right coronary artery. This was a dominant medium caliber vessel that  supplies the posterior descending artery and a posterior ventricular  Anel Purohit in its terminal segment. The right coronary artery is 100%  occluded in the mid-section. The distal vessel fills the in situ RIMA  graft anastomosed to the distal right coronary artery. The posterior  descending artery and the posterior ventricular Cabrina Shiroma have mild _______  of 30%.  5. RIMA to the distal right coronary artery is patent. This was an in situ  graft.  6. LIMA to the LAD is patent. This was also an in situ graft.  7. Left ventricle. Normal end-systolic and end-diastolic dimensions.  Normal left ventricular function is well preserved. Ejection  fraction 50-  55%. No mitral regurgitation. LV pressure is 120/5. Aortic is 120/65.  LVEDP is 15.  ASSESSMENT AND PLAN: The patient is a 84 year old gentleman with two-vessel  coronary artery disease that is well revascularized surgically. He has well-  preserved LV function. The only concern is the second diagonal Macklin Jacquin which  is compromised by moderate disease in the proximal and mid-LAD. This does  not appear to be critical in nature; however, this disease is presumptively  the reason the patient underwent bypass surgery. In addition, the amount of  myocardium supplied by this diagonal Aubrey Blackard is relatively small and is  unlikely to elicit the symptoms the patient presented with. We will thus  pursue a conservative course of medical therapy should the patient have  recurrent symptoms or an abnormal stress imaging study. Percutaneous  intervention may be considered to improve flow to the second diagonal   Katherine Syme.   Jan 2010 Echo SUMMARY - Overall left ventricular systolic function was normal. Left ventricular ejection fraction was estimated , range being 55 % to 60 %. There were no left ventricular regional wall motion abnormalities. Left ventricular wall thickness was mildly increased. There was moderate basal septal hypertrophy. - The aortic valve was mildly calcified. - There was mild fibrocalcific change of the aortic root. - The effective orifice of mitral regurgitation by proximal isovelocity surface area was 0.13 cm^2. The volume of mitral regurgitation by proximal isovelocity surface area was 19 cc. - The left atrium was moderately dilated. - There was mild right ventricular hypertrophy. - The right atrium was mild to moderately dilated.      Assessment and Plan  1. CAD - doing well without symptoms, continue current meds  2. Hyperlipidemia - upcoming labs with pcp, continue statin  3. Aflutter - has not been on av nodal agent due to bradycardia - not interested in NOACs, continue coumadin  4. Bradycardia/AV block - EKG today shows SR, 1st degree AV block - prior wenchebach on monitor - asymptomatic, continue watchful waiting. Has been evaluated by EP  F/u 1 year      Arnoldo Lenis, M.D.

## 2018-05-31 ENCOUNTER — Ambulatory Visit (INDEPENDENT_AMBULATORY_CARE_PROVIDER_SITE_OTHER): Payer: Medicare HMO | Admitting: *Deleted

## 2018-05-31 DIAGNOSIS — Z5181 Encounter for therapeutic drug level monitoring: Secondary | ICD-10-CM | POA: Diagnosis not present

## 2018-05-31 DIAGNOSIS — I4892 Unspecified atrial flutter: Secondary | ICD-10-CM

## 2018-05-31 LAB — POCT INR: INR: 2.2 (ref 2.0–3.0)

## 2018-05-31 NOTE — Patient Instructions (Signed)
Continue taking coumadin 1 1/2 tablets daily.  Recheck in 6 weeks  

## 2018-06-01 ENCOUNTER — Ambulatory Visit (INDEPENDENT_AMBULATORY_CARE_PROVIDER_SITE_OTHER): Payer: Medicare HMO | Admitting: Otolaryngology

## 2018-06-01 DIAGNOSIS — H903 Sensorineural hearing loss, bilateral: Secondary | ICD-10-CM | POA: Diagnosis not present

## 2018-06-01 DIAGNOSIS — H6123 Impacted cerumen, bilateral: Secondary | ICD-10-CM

## 2018-06-15 ENCOUNTER — Encounter: Payer: Self-pay | Admitting: Gastroenterology

## 2018-06-26 ENCOUNTER — Other Ambulatory Visit (HOSPITAL_COMMUNITY)
Admission: RE | Admit: 2018-06-26 | Discharge: 2018-06-26 | Disposition: A | Discharge status: Home / Self Care | Payer: Medicare HMO | Source: Ambulatory Visit | Attending: Urology | Admitting: Urology

## 2018-06-26 DIAGNOSIS — C678 Malignant neoplasm of overlapping sites of bladder: Secondary | ICD-10-CM | POA: Insufficient documentation

## 2018-06-27 ENCOUNTER — Ambulatory Visit: Payer: Medicare HMO | Admitting: Urology

## 2018-06-27 DIAGNOSIS — C678 Malignant neoplasm of overlapping sites of bladder: Secondary | ICD-10-CM | POA: Diagnosis not present

## 2018-07-05 ENCOUNTER — Ambulatory Visit (INDEPENDENT_AMBULATORY_CARE_PROVIDER_SITE_OTHER): Payer: Medicare HMO | Admitting: *Deleted

## 2018-07-05 DIAGNOSIS — I4892 Unspecified atrial flutter: Secondary | ICD-10-CM

## 2018-07-05 DIAGNOSIS — Z5181 Encounter for therapeutic drug level monitoring: Secondary | ICD-10-CM | POA: Diagnosis not present

## 2018-07-05 LAB — POCT INR: INR: 2.2 (ref 2.0–3.0)

## 2018-07-05 NOTE — Patient Instructions (Signed)
Continue taking coumadin 1 1/2 tablets daily.  Recheck in 6 weeks  

## 2018-08-16 ENCOUNTER — Ambulatory Visit (INDEPENDENT_AMBULATORY_CARE_PROVIDER_SITE_OTHER): Payer: Medicare HMO | Admitting: *Deleted

## 2018-08-16 DIAGNOSIS — I4892 Unspecified atrial flutter: Secondary | ICD-10-CM | POA: Diagnosis not present

## 2018-08-16 DIAGNOSIS — Z5181 Encounter for therapeutic drug level monitoring: Secondary | ICD-10-CM | POA: Diagnosis not present

## 2018-08-16 LAB — POCT INR: INR: 2.7 (ref 2.0–3.0)

## 2018-08-16 NOTE — Patient Instructions (Signed)
Continue taking coumadin 1 1/2 tablets daily.  Recheck in 6 weeks

## 2018-09-27 ENCOUNTER — Ambulatory Visit (INDEPENDENT_AMBULATORY_CARE_PROVIDER_SITE_OTHER): Payer: Medicare HMO | Admitting: Pharmacist

## 2018-09-27 DIAGNOSIS — I4892 Unspecified atrial flutter: Secondary | ICD-10-CM | POA: Diagnosis not present

## 2018-09-27 DIAGNOSIS — Z5181 Encounter for therapeutic drug level monitoring: Secondary | ICD-10-CM

## 2018-09-27 LAB — POCT INR: INR: 2 (ref 2.0–3.0)

## 2018-09-27 NOTE — Patient Instructions (Signed)
Description   Continue taking coumadin 1 1/2 tablets daily.  Recheck in 6 weeks

## 2018-11-08 ENCOUNTER — Ambulatory Visit (INDEPENDENT_AMBULATORY_CARE_PROVIDER_SITE_OTHER): Payer: Medicare HMO | Admitting: *Deleted

## 2018-11-08 DIAGNOSIS — Z5181 Encounter for therapeutic drug level monitoring: Secondary | ICD-10-CM | POA: Diagnosis not present

## 2018-11-08 DIAGNOSIS — I4892 Unspecified atrial flutter: Secondary | ICD-10-CM

## 2018-11-08 LAB — POCT INR: INR: 2.2 (ref 2.0–3.0)

## 2018-11-08 NOTE — Patient Instructions (Signed)
Continue taking coumadin 1 1/2 tablets daily.  Recheck in 6 weeks

## 2018-12-19 ENCOUNTER — Telehealth: Payer: Self-pay | Admitting: *Deleted

## 2018-12-19 NOTE — Telephone Encounter (Signed)
° °  COVID-19 Pre-Screening Questions: ° °• Do you currently have a fever?NO ° ° °• Have you recently travelled on a cruise, internationally, or to [place], [place], [place], [place], [place], or [place], [place] ([place]) ? NO °•  °• Have you been in contact with someone that is currently pending confirmation of Covid19 testing or has been confirmed to have the Covid19 virus?  NO °•  °Are you currently experiencing fatigue or cough? NO ° ° °   ° ° ° ° °

## 2018-12-20 ENCOUNTER — Other Ambulatory Visit: Payer: Self-pay

## 2018-12-20 ENCOUNTER — Ambulatory Visit (INDEPENDENT_AMBULATORY_CARE_PROVIDER_SITE_OTHER): Payer: Medicare HMO | Admitting: *Deleted

## 2018-12-20 DIAGNOSIS — Z5181 Encounter for therapeutic drug level monitoring: Secondary | ICD-10-CM

## 2018-12-20 DIAGNOSIS — I4892 Unspecified atrial flutter: Secondary | ICD-10-CM | POA: Diagnosis not present

## 2018-12-20 LAB — POCT INR: INR: 2 (ref 2.0–3.0)

## 2018-12-20 NOTE — Patient Instructions (Signed)
Take coumadin 2 tablets tonight then resume 1 1/2 tablets daily.  Recheck in 6 weeks

## 2019-01-05 ENCOUNTER — Other Ambulatory Visit: Payer: Self-pay | Admitting: Cardiology

## 2019-01-16 ENCOUNTER — Ambulatory Visit (INDEPENDENT_AMBULATORY_CARE_PROVIDER_SITE_OTHER): Payer: Medicare HMO | Admitting: Urology

## 2019-01-16 DIAGNOSIS — C678 Malignant neoplasm of overlapping sites of bladder: Secondary | ICD-10-CM

## 2019-02-07 ENCOUNTER — Ambulatory Visit (INDEPENDENT_AMBULATORY_CARE_PROVIDER_SITE_OTHER): Payer: Medicare HMO | Admitting: *Deleted

## 2019-02-07 DIAGNOSIS — Z5181 Encounter for therapeutic drug level monitoring: Secondary | ICD-10-CM

## 2019-02-07 DIAGNOSIS — I4892 Unspecified atrial flutter: Secondary | ICD-10-CM

## 2019-02-07 LAB — POCT INR: INR: 2.1 (ref 2.0–3.0)

## 2019-02-07 NOTE — Patient Instructions (Signed)
Continue coumadin 1 1/2 tablets daily  Recheck in 6 weeks 

## 2019-03-21 ENCOUNTER — Ambulatory Visit (INDEPENDENT_AMBULATORY_CARE_PROVIDER_SITE_OTHER): Payer: Medicare HMO | Admitting: *Deleted

## 2019-03-21 DIAGNOSIS — Z5181 Encounter for therapeutic drug level monitoring: Secondary | ICD-10-CM

## 2019-03-21 DIAGNOSIS — I4892 Unspecified atrial flutter: Secondary | ICD-10-CM

## 2019-03-21 LAB — POCT INR: INR: 2.6 (ref 2.0–3.0)

## 2019-03-21 NOTE — Patient Instructions (Signed)
Continue coumadin 1 1/2 tablets daily  Recheck in 6 weeks 

## 2019-05-02 ENCOUNTER — Other Ambulatory Visit: Payer: Self-pay

## 2019-05-02 ENCOUNTER — Ambulatory Visit (INDEPENDENT_AMBULATORY_CARE_PROVIDER_SITE_OTHER): Payer: Medicare HMO | Admitting: *Deleted

## 2019-05-02 DIAGNOSIS — Z5181 Encounter for therapeutic drug level monitoring: Secondary | ICD-10-CM | POA: Diagnosis not present

## 2019-05-02 DIAGNOSIS — I4892 Unspecified atrial flutter: Secondary | ICD-10-CM

## 2019-05-02 LAB — POCT INR: INR: 2.9 (ref 2.0–3.0)

## 2019-05-02 NOTE — Patient Instructions (Signed)
Continue coumadin 1 1/2 tablets daily  Recheck in 6 weeks 

## 2019-05-15 ENCOUNTER — Other Ambulatory Visit: Payer: Self-pay | Admitting: Cardiology

## 2019-05-15 NOTE — Telephone Encounter (Signed)
Called and spoke with Pharmacist, Jeani Hawking from [place]. Stated prescription should say take 1.5 tablets daily or as directed by the [facility]. She stated that she would fix prescription.

## 2019-05-15 NOTE — Addendum Note (Signed)
Addended by: Johny Shock B on: 05/15/2019 11:45 AM   Modules accepted: Orders

## 2019-05-31 ENCOUNTER — Ambulatory Visit: Payer: Medicare HMO | Admitting: Cardiology

## 2019-06-04 ENCOUNTER — Ambulatory Visit (INDEPENDENT_AMBULATORY_CARE_PROVIDER_SITE_OTHER): Payer: Medicare HMO | Admitting: Otolaryngology

## 2019-06-13 ENCOUNTER — Ambulatory Visit (INDEPENDENT_AMBULATORY_CARE_PROVIDER_SITE_OTHER): Payer: Medicare HMO | Admitting: *Deleted

## 2019-06-13 ENCOUNTER — Other Ambulatory Visit: Payer: Self-pay

## 2019-06-13 DIAGNOSIS — Z5181 Encounter for therapeutic drug level monitoring: Secondary | ICD-10-CM

## 2019-06-13 DIAGNOSIS — I4892 Unspecified atrial flutter: Secondary | ICD-10-CM | POA: Diagnosis not present

## 2019-06-13 LAB — POCT INR: INR: 2.3 (ref 2.0–3.0)

## 2019-06-13 NOTE — Patient Instructions (Signed)
Continue coumadin 1 1/2 tablets daily  Recheck in 6 weeks 

## 2019-07-05 ENCOUNTER — Ambulatory Visit: Payer: Medicare HMO | Admitting: Cardiology

## 2019-07-13 ENCOUNTER — Encounter: Payer: Self-pay | Admitting: Cardiology

## 2019-07-13 ENCOUNTER — Other Ambulatory Visit: Payer: Self-pay

## 2019-07-13 ENCOUNTER — Ambulatory Visit (INDEPENDENT_AMBULATORY_CARE_PROVIDER_SITE_OTHER): Payer: Medicare HMO | Admitting: Cardiology

## 2019-07-13 ENCOUNTER — Encounter: Payer: Self-pay | Admitting: *Deleted

## 2019-07-13 VITALS — BP 118/62 | HR 76 | Ht 68.0 in | Wt 174.0 lb

## 2019-07-13 DIAGNOSIS — I2510 Atherosclerotic heart disease of native coronary artery without angina pectoris: Secondary | ICD-10-CM | POA: Diagnosis not present

## 2019-07-13 DIAGNOSIS — I4892 Unspecified atrial flutter: Secondary | ICD-10-CM

## 2019-07-13 DIAGNOSIS — I441 Atrioventricular block, second degree: Secondary | ICD-10-CM | POA: Diagnosis not present

## 2019-07-13 DIAGNOSIS — E782 Mixed hyperlipidemia: Secondary | ICD-10-CM | POA: Diagnosis not present

## 2019-07-13 NOTE — Progress Notes (Signed)
Clinical Summary Shane [name] is a 85 y.o.male seen today for follow up of the following medical problems.   1. CAD - prior CABG in 1990 - last cath 2003 as reported below. - Jan 2010 echo LVEF 55-60%    - no recent chest pain. No SOB or DOE.  - compliant with meds - walks several miles a week without troubles.   2. Hyperlipidemia - compliant with statin  - labs followed by pcp  3. Aflutter - no recent palpitations - intermittent low heart rates. EKG 8/7 showed sinus brady, wenchebach block.  - no palpitations.    5. Chronic bradycardia/AV block - 2:1 AV block noted on holter. FOllowed by EP for mobitz type I undergoing watchful waiting.   - no recent lightheadedness or dizziness, no presyncope or syncope   SH: married 63 years. Son is an ob/gyn in [employer], another son is a English as a second language teacher who lived in [place] 5 years moving to [place].   SH: works at [employer].    Past Medical History:  Diagnosis Date  . Arteriosclerotic cardiovascular disease (ASCVD)    CABG surgery in 1990. MI in 1986; coronary angiography in 2003-critical LAD with patent LIMA; total obstruction of the RCA with patent RIMA; low normal EF.  [name] Atrial flutter, paroxysmal ([place])    asymptomatic; onset in 2010; 4:1 AVB with low-dose metoprolol; moderate left      atrial enlargement and mild left ventricular hypertrophy with normal ejection fraction by echocardiography  . Benign prostatic hypertrophy   . Bradycardia    a. requiring cessation of beta blocker 12/2016.  [name] Chronic anticoagulation   . Hyperlipidemia    Lipid profile in 09/2010:196, 84, 54, 125  . Mobitz type 1 second degree atrioventricular block 12/17/2016  . Nephrolithiasis   . Polymyalgia rheumatica (HCC)      No Known Allergies   Current Outpatient Medications  Medication Sig Dispense Refill  . acetaminophen (TYLENOL) 500 MG tablet Take 1,000 mg by mouth every 8 (eight) hours as needed for mild pain.    [name] atorvastatin  (LIPITOR) 80 MG tablet TAKE 1 TABLET BY MOUTH ONCE DAILY IN THE EVENING 90 tablet 3  . Tamsulosin HCl (FLOMAX) 0.4 MG CAPS Take 0.4 mg by mouth daily after supper.     . warfarin (COUMADIN) 5 MG tablet Take 1.5 tablets daily or as directed by the coumadin clinic 145 tablet 0   No current facility-administered medications for this visit.      Past Surgical History:  Procedure Laterality Date  . COLONOSCOPY  08/21/01   RMR: Left-sided diverticulum.  Remainder of colonic mucosa appeared normal  . COLONOSCOPY N/A 07/31/2013   Procedure: COLONOSCOPY;  Surgeon: Danie Binder, MD;  [facility];  Service: Endoscopy;  Laterality: N/A;  10:30  . CORONARY ARTERY BYPASS GRAFT  1990  . LUMBAR DISC SURGERY    . TRANSURETHRAL RESECTION OF BLADDER TUMOR N/A 03/01/2017   Procedure: TRANSURETHRAL RESECTION OF BLADDER TUMOR (TURBT);  Surgeon: Franchot Gallo, MD;  Location: [facility];  Service: Urology;  Laterality: N/A;  . TRANSURETHRAL RESECTION OF BLADDER TUMOR N/A 04/12/2017   Procedure: REPEAT TRANSURETHRAL RESECTION OF BLADDER TUMOR (TURBT);  Surgeon: Franchot Gallo, MD;  Location: [facility];  Service: Urology;  Laterality: N/A;  1 HR [phone] [employer] [place]     No Known Allergies    Family History  Problem Relation Age of Onset  . Heart attack Mother   . Coronary artery disease Brother  CABG + pacemaker  . Pneumonia Brother        infant death  . Colon cancer Neg Hx   . Colon polyps Neg Hx      Social History Shane [name] reports that he quit smoking about 36 years ago. He has a 37.50 pack-year smoking history. He has never used smokeless tobacco. Shane [name] reports current alcohol use.   Review of Systems CONSTITUTIONAL: No weight loss, fever, chills, weakness or fatigue.  HEENT: Eyes: No visual loss, blurred vision, double vision or yellow sclerae.No hearing loss, sneezing, congestion, runny nose or sore throat.  SKIN: No rash or itching.   CARDIOVASCULAR: per hpi RESPIRATORY: No shortness of breath, cough or sputum.  GASTROINTESTINAL: No anorexia, nausea, vomiting or diarrhea. No abdominal pain or blood.  GENITOURINARY: No burning on urination, no polyuria NEUROLOGICAL: No headache, dizziness, syncope, paralysis, ataxia, numbness or tingling in the extremities. No change in bowel or bladder control.  MUSCULOSKELETAL: No muscle, back pain, joint pain or stiffness.  LYMPHATICS: No enlarged nodes. No history of splenectomy.  PSYCHIATRIC: No history of depression or anxiety.  ENDOCRINOLOGIC: No reports of sweating, cold or heat intolerance. No polyuria or polydipsia.  [name]   Physical Examination Today's Vitals   07/13/19 1321  BP: 118/62  Pulse: 76  Weight: 174 lb (78.9 kg)  Height: 5\' 8"  (1.727 m)   Body mass index is 26.46 kg/m.  Gen: resting comfortably, no acute distress HEENT: no scleral icterus, pupils equal round and reactive, no palptable cervical adenopathy,  CV: RRR, no mrg, no jvd Resp: Clear to auscultation bilaterally GI: abdomen is soft, non-tender, non-distended, normal bowel sounds, no hepatosplenomegaly MSK: extremities are warm, no edema.  Skin: warm, no rash Neuro:  no focal deficits Psych: appropriate affect   Diagnostic Studies  Cath 07/2002 FINDINGS:  1. Left main trunk. Medium caliber vessel with mild ________.  2. LAD. This is a medium caliber vessel which supplies a trivial first  diagonal Laurina Fischl proximal segment, medium caliber second diagonal Buzz Axel  thereafter. The LAD then extends to the apex. The LAD has moderate  disease, 50% of the proximal left segment, which then extends into a  narrowing of 70% encompassing the trivial diagonal Eriyanna Kofoed. The distal  LAD has mild irregularities and is seen to fill predominantly via the  LIMA graft. There is mid narrowing of 30%. The second diagonal Tanairy Payeur  has an ostial narrowing of 50% and fills predominantly via antegrade  flow.   3. Left circumflex artery. This is a medium caliber vessel that supplies a  small first marginal Yizel Canby and the proximal segment a larger second  marginal Kyia Rhude. In the mid-section, there is moderate narrowing of 30-  40% of the proximal segment of the second marginal Decklan Mau.  4. Right coronary artery. This was a dominant medium caliber vessel that  supplies the posterior descending artery and a posterior ventricular  Jasmin Winberry in its terminal segment. The right coronary artery is 100%  occluded in the mid-section. The distal vessel fills the in situ RIMA  graft anastomosed to the distal right coronary artery. The posterior  descending artery and the posterior ventricular Aubert Choyce have mild _______  of 30%.  5. RIMA to the distal right coronary artery is patent. This was an in situ  graft.  6. LIMA to the LAD is patent. This was also an in situ graft.  7. Left ventricle. Normal end-systolic and end-diastolic dimensions.  Normal left ventricular function is well preserved. Ejection fraction 50-  55%. No mitral regurgitation. LV pressure is 120/5. Aortic is 120/65.  LVEDP is 15.  ASSESSMENT AND PLAN: The patient is a 85 year old gentleman with two-vessel  coronary artery disease that is well revascularized surgically. He has well-  preserved LV function. The only concern is the second diagonal Adaliah Hiegel which  is compromised by moderate disease in the proximal and mid-LAD. This does  not appear to be critical in nature; however, this disease is presumptively  the reason the patient underwent bypass surgery. In addition, the amount of  myocardium supplied by this diagonal Orvetta Danielski is relatively small and is  unlikely to elicit the symptoms the patient presented with. We will thus  pursue a conservative course of medical therapy should the patient have  recurrent symptoms or an abnormal stress imaging study. Percutaneous  intervention may be considered to improve flow to  the second diagonal  Sanyah Molnar.   Jan 2010 Echo SUMMARY - Overall left ventricular systolic function was normal. Left ventricular ejection fraction was estimated , range being 55 % to 60 %. There were no left ventricular regional wall motion abnormalities. Left ventricular wall thickness was mildly increased. There was moderate basal septal hypertrophy. - The aortic valve was mildly calcified. - There was mild fibrocalcific change of the aortic root. - The effective orifice of mitral regurgitation by proximal isovelocity surface area was 0.13 cm^2. The volume of mitral regurgitation by proximal isovelocity surface area was 19 cc. - The left atrium was moderately dilated. - There was mild right ventricular hypertrophy. - The right atrium was mild to moderately dilated.    Assessment and Plan    1. CAD - no symptoms, continue current meds  2. Hyperlipidemia - request labs from pcp, continue statin  3. Aflutter - has not been on av nodal agent due to bradycardia - not interested in NOACs, he will continue coumadin - continue current therapy.   4. Bradycardia/AV block 2nd degreee -Has been evaluated by EP - watchful waiting, remains asymptomatic.    F/u 1 year    Arnoldo Lenis, M.D.

## 2019-07-13 NOTE — Patient Instructions (Signed)

## 2019-07-24 ENCOUNTER — Other Ambulatory Visit (HOSPITAL_COMMUNITY): Admit: 2019-07-24 | Payer: Medicare HMO | Source: Ambulatory Visit

## 2019-07-24 ENCOUNTER — Ambulatory Visit (INDEPENDENT_AMBULATORY_CARE_PROVIDER_SITE_OTHER): Payer: Medicare HMO | Admitting: Urology

## 2019-07-24 ENCOUNTER — Other Ambulatory Visit (HOSPITAL_COMMUNITY)
Admission: RE | Admit: 2019-07-24 | Discharge: 2019-07-24 | Disposition: A | Discharge status: Home / Self Care | Payer: Medicare HMO | Source: Ambulatory Visit | Attending: Urology | Admitting: Urology

## 2019-07-24 DIAGNOSIS — C678 Malignant neoplasm of overlapping sites of bladder: Secondary | ICD-10-CM | POA: Insufficient documentation

## 2019-07-24 DIAGNOSIS — Z8551 Personal history of malignant neoplasm of bladder: Secondary | ICD-10-CM | POA: Diagnosis not present

## 2019-07-25 ENCOUNTER — Other Ambulatory Visit: Payer: Self-pay

## 2019-07-25 ENCOUNTER — Ambulatory Visit (INDEPENDENT_AMBULATORY_CARE_PROVIDER_SITE_OTHER): Payer: Medicare HMO | Admitting: *Deleted

## 2019-07-25 DIAGNOSIS — I4892 Unspecified atrial flutter: Secondary | ICD-10-CM | POA: Diagnosis not present

## 2019-07-25 DIAGNOSIS — Z5181 Encounter for therapeutic drug level monitoring: Secondary | ICD-10-CM

## 2019-07-25 LAB — POCT INR: INR: 2.1 (ref 2.0–3.0)

## 2019-07-25 NOTE — Patient Instructions (Signed)
Take coumadin 2 tablets tonight then resume 1 1/2 tablets daily.  Recheck in 6 weeks  

## 2019-08-21 ENCOUNTER — Other Ambulatory Visit: Payer: Self-pay | Admitting: Cardiology

## 2019-09-05 ENCOUNTER — Ambulatory Visit (INDEPENDENT_AMBULATORY_CARE_PROVIDER_SITE_OTHER): Payer: Medicare HMO | Admitting: *Deleted

## 2019-09-05 ENCOUNTER — Other Ambulatory Visit: Payer: Self-pay

## 2019-09-05 DIAGNOSIS — Z5181 Encounter for therapeutic drug level monitoring: Secondary | ICD-10-CM | POA: Diagnosis not present

## 2019-09-05 DIAGNOSIS — I4892 Unspecified atrial flutter: Secondary | ICD-10-CM

## 2019-09-05 LAB — POCT INR: INR: 2.2 (ref 2.0–3.0)

## 2019-09-05 NOTE — Patient Instructions (Signed)
Continue warfarin 1 1/2  tablets daily Recheck in 6 weeks 

## 2019-11-08 ENCOUNTER — Ambulatory Visit (INDEPENDENT_AMBULATORY_CARE_PROVIDER_SITE_OTHER): Payer: Medicare HMO | Admitting: *Deleted

## 2019-11-08 ENCOUNTER — Other Ambulatory Visit: Payer: Self-pay

## 2019-11-08 DIAGNOSIS — I4892 Unspecified atrial flutter: Secondary | ICD-10-CM | POA: Diagnosis not present

## 2019-11-08 DIAGNOSIS — Z5181 Encounter for therapeutic drug level monitoring: Secondary | ICD-10-CM

## 2019-11-08 LAB — POCT INR: INR: 2.3 (ref 2.0–3.0)

## 2019-11-08 NOTE — Patient Instructions (Signed)
Continue warfarin 1 1/2  tablets daily Recheck in 6 weeks 

## 2019-12-19 ENCOUNTER — Other Ambulatory Visit: Payer: Self-pay

## 2019-12-19 ENCOUNTER — Ambulatory Visit (INDEPENDENT_AMBULATORY_CARE_PROVIDER_SITE_OTHER): Payer: Medicare HMO | Admitting: *Deleted

## 2019-12-19 DIAGNOSIS — Z5181 Encounter for therapeutic drug level monitoring: Secondary | ICD-10-CM | POA: Diagnosis not present

## 2019-12-19 DIAGNOSIS — I4892 Unspecified atrial flutter: Secondary | ICD-10-CM

## 2019-12-19 LAB — POCT INR: INR: 2.5 (ref 2.0–3.0)

## 2019-12-19 NOTE — Patient Instructions (Signed)
Continue warfarin 1 1/2  tablets daily Recheck in 6 weeks 

## 2020-01-30 ENCOUNTER — Other Ambulatory Visit: Payer: Self-pay

## 2020-01-30 ENCOUNTER — Ambulatory Visit (INDEPENDENT_AMBULATORY_CARE_PROVIDER_SITE_OTHER): Payer: Medicare HMO | Admitting: *Deleted

## 2020-01-30 DIAGNOSIS — Z5181 Encounter for therapeutic drug level monitoring: Secondary | ICD-10-CM | POA: Diagnosis not present

## 2020-01-30 DIAGNOSIS — I4892 Unspecified atrial flutter: Secondary | ICD-10-CM | POA: Diagnosis not present

## 2020-01-30 LAB — POCT INR: INR: 2.2 (ref 2.0–3.0)

## 2020-01-30 NOTE — Patient Instructions (Signed)
Continue warfarin 1 1/2  tablets daily Recheck in 6 weeks 

## 2020-02-06 ENCOUNTER — Other Ambulatory Visit: Payer: Self-pay | Admitting: Cardiology

## 2020-03-18 ENCOUNTER — Ambulatory Visit (INDEPENDENT_AMBULATORY_CARE_PROVIDER_SITE_OTHER): Payer: Medicare HMO | Admitting: *Deleted

## 2020-03-18 DIAGNOSIS — Z5181 Encounter for therapeutic drug level monitoring: Secondary | ICD-10-CM

## 2020-03-18 DIAGNOSIS — I4892 Unspecified atrial flutter: Secondary | ICD-10-CM | POA: Diagnosis not present

## 2020-03-18 LAB — POCT INR: INR: 2.4 (ref 2.0–3.0)

## 2020-03-18 NOTE — Patient Instructions (Signed)
Continue warfarin 1 1/2  tablets daily Recheck in 6 weeks 

## 2020-04-22 ENCOUNTER — Ambulatory Visit (INDEPENDENT_AMBULATORY_CARE_PROVIDER_SITE_OTHER): Payer: Medicare HMO | Admitting: Urology

## 2020-04-22 ENCOUNTER — Other Ambulatory Visit: Payer: Medicare HMO | Admitting: Urology

## 2020-04-22 ENCOUNTER — Other Ambulatory Visit: Payer: Self-pay

## 2020-04-22 ENCOUNTER — Encounter: Payer: Self-pay | Admitting: Urology

## 2020-04-22 VITALS — BP 138/68 | HR 59 | Temp 97.9°F | Ht 68.0 in | Wt 174.0 lb

## 2020-04-22 DIAGNOSIS — C675 Malignant neoplasm of bladder neck: Secondary | ICD-10-CM

## 2020-04-22 LAB — MICROSCOPIC EXAMINATION
Bacteria, UA: NONE SEEN
Epithelial Cells (non renal): NONE SEEN /hpf (ref 0–10)
Renal Epithel, UA: NONE SEEN /hpf
WBC, UA: NONE SEEN /hpf (ref 0–5)

## 2020-04-22 LAB — URINALYSIS, ROUTINE W REFLEX MICROSCOPIC
Bilirubin, UA: NEGATIVE
Glucose, UA: NEGATIVE
Ketones, UA: NEGATIVE
Leukocytes,UA: NEGATIVE
Nitrite, UA: NEGATIVE
Protein,UA: NEGATIVE
Specific Gravity, UA: 1.01 (ref 1.005–1.030)
Urobilinogen, Ur: 0.2 mg/dL (ref 0.2–1.0)
pH, UA: 7 (ref 5.0–7.5)

## 2020-04-22 MED ORDER — CIPROFLOXACIN HCL 500 MG PO TABS
500.0000 mg | ORAL_TABLET | Freq: Once | ORAL | Status: AC
  Administered 2020-04-22: 500 mg via ORAL

## 2020-04-22 NOTE — Addendum Note (Signed)
Addended by: Dorisann Frames on: 04/22/2020 10:11 AM   Modules accepted: Orders

## 2020-04-22 NOTE — Progress Notes (Signed)
H&P  Chief Complaint: Patient has been treated for bladder cancer.  History of Present Illness:  8.17.2021: Pt here today for cysto.  He has had no urinary issues or blood in the urine over the past 9 months.  (below copied from [facility] records):  He underwent TURBT of an extensive bladder neck/anterior bladder wall tumor on 6.26.2018. Mitomycin was left in postoperatively. 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   Repeat TURBT performed on 8.7.2018. Pathology revealed only benign inflammatory changes.   He completed 6 induction treatments of BCG on 10.16.2018.   11.27.2018--cysto showed moderate trabeculation, erythematous urothelium in the bladder neck/trigonal region, most likely related to prior resection and BCG. Cytology revealed no evidence of high grade urothelial carcinoma.   1.22.2019: completed 1st course of maintenance BCG.   4.2.2019: Cysto negative, cytology nondiagnostic (scant cellularity)   4.30.2019: Completed 2nd maintenance BCG   8.13.2019: No recent hematuria. Cystoscopy and cytology were negative.   9.11.2019: He completed his 3rd maintenance BCG series.   10.22.2019: Cysto negative, BCG Rx's discontinued.   5.12.2020: Cysto negative   11.17.2020: Cystoscopy negative.    Past Medical History:  Diagnosis Date  . Arteriosclerotic cardiovascular disease (ASCVD)    CABG surgery in 1990. MI in 1986; coronary angiography in 2003-critical LAD with patent LIMA; total obstruction of the RCA with patent RIMA; low normal EF.  [name] Atrial flutter, paroxysmal ([place])    asymptomatic; onset in 2010; 4:1 AVB with low-dose metoprolol; moderate left      atrial enlargement and mild left ventricular hypertrophy with normal ejection fraction by echocardiography  . Benign prostatic hypertrophy   . Bradycardia    a. requiring cessation of beta blocker 12/2016.  [name] Chronic anticoagulation   . Hyperlipidemia    Lipid profile in 09/2010:196, 84, 54, 125  .  Mobitz type 1 second degree atrioventricular block 12/17/2016  . Nephrolithiasis   . Polymyalgia rheumatica (HCC)     Past Surgical History:  Procedure Laterality Date  . COLONOSCOPY  08/21/01   RMR: Left-sided diverticulum.  Remainder of colonic mucosa appeared normal  . COLONOSCOPY N/A 07/31/2013   Procedure: COLONOSCOPY;  Surgeon: Danie Binder, MD;  Location: [facility];  Service: Endoscopy;  Laterality: N/A;  10:30  . CORONARY ARTERY BYPASS GRAFT  1990  . LUMBAR DISC SURGERY    . TRANSURETHRAL RESECTION OF BLADDER TUMOR N/A 03/01/2017   Procedure: TRANSURETHRAL RESECTION OF BLADDER TUMOR (TURBT);  Surgeon: Franchot Gallo, MD;  Location: [facility];  Service: Urology;  Laterality: N/A;  . TRANSURETHRAL RESECTION OF BLADDER TUMOR N/A 04/12/2017   Procedure: REPEAT TRANSURETHRAL RESECTION OF BLADDER TUMOR (TURBT);  Surgeon: Franchot Gallo, MD;  Location: [facility];  Service: Urology;  Laterality: N/A;  1 HR [phone] [place]    Home Medications:  Allergies as of 04/22/2020   No Known Allergies     Medication List       Accurate as of April 22, 2020  8:51 AM. If you have any questions, ask your nurse or doctor.        acetaminophen 500 MG tablet Commonly known as: TYLENOL Take 1,000 mg by mouth every 8 (eight) hours as needed for mild pain.   atorvastatin 80 MG tablet Commonly known as: LIPITOR TAKE 1 TABLET BY MOUTH ONCE DAILY IN THE EVENING   Flomax 0.4 MG Caps capsule Generic drug: tamsulosin Take 0.4 mg by mouth daily after supper.   warfarin 5 MG tablet Commonly known as: COUMADIN Take as directed by  the anticoagulation clinic. If you are unsure how to take this medication, talk to your nurse or doctor. Original instructions: TAKE 1 & 1/2 (ONE & ONE-HALF) TABLETS BY MOUTH ONCE DAILY AT  6PM  OR  AS  DIRECTED  BY  COUMADIN  CLINIC       Allergies: No Known Allergies  Family History  Problem Relation Age of Onset  . Heart attack Mother    . Coronary artery disease Brother        CABG + pacemaker  . Pneumonia Brother        infant death  . Colon cancer Neg Hx   . Colon polyps Neg Hx     Social History:  reports that he quit smoking about 37 years ago. He has a 37.50 pack-year smoking history. He has never used smokeless tobacco. He reports current alcohol use. He reports that he does not use drugs.  ROS: A complete review of systems was performed.  All systems are negative except for pertinent findings as noted.  Physical Exam:  Vital signs in last 24 hours: There were no vitals taken for this visit. Constitutional:  Alert and oriented, No acute distress Cardiovascular: Regular rate  Respiratory: Normal respiratory effort Genitourinary: Normal male phallus, testes are descended bilaterally and non-tender and without masses, scrotum is normal in appearance without lesions or masses, perineum is normal on inspection. Lymphatic: No lymphadenopathy Neurologic: Grossly intact, no focal deficits Psychiatric: Normal mood and affect  Laboratory Data:  No results for input(s): WBC, HGB, HCT, PLT in the last 72 hours.  No results for input(s): NA, K, CL, GLUCOSE, BUN, CALCIUM, CREATININE in the last 72 hours.  Invalid input(s): CO3   No results found for this or any previous visit (from the past 24 hour(s)). No results found for this or any previous visit (from the past 240 hour(s)).  Renal Function: No results for input(s): CREATININE in the last 168 hours. CrCl cannot be calculated (Patient's most recent lab result is older than the maximum 21 days allowed.).  Radiologic Imaging: No results found.  Cystoscopy Procedure Note:  Indication: Follow-up of bladder cancer  After informed consent and discussion of the procedure and its risks, Shane [name] was positioned and prepped in the standard fashion.  Cystoscopy was performed with a flexible cystoscope.   Findings: Urethra: No lesions Prostate: Mild  hypertrophy with obstructive lateral lobes Bladder neck: Normal Ureteral orifices: Normal location and configuration Bladder: Moderate trabeculations with small cellules.  No urothelial abnormalities.  The patient tolerated the procedure well.    Impression/Assessment:  History of bladder cancer, high-grade nonmuscle invasive, status post BCG/immune therapy with no evidence of recurrence  Plan:  1.  We will send off bladder washings for cytology  2.  I will see back in 1 year for cystoscopy.

## 2020-04-22 NOTE — Progress Notes (Signed)
Urological Symptom Review  Patient is experiencing the following symptoms: Get up at night to urinate   Review of Systems  Gastrointestinal (upper)  : Negative for upper GI symptoms  Gastrointestinal (lower) : Negative for lower GI symptoms  Constitutional : Negative for symptoms  Skin: Negative for skin symptoms  Eyes: Negative for eye symptoms  Ear/Nose/Throat : Negative for Ear/Nose/Throat symptoms  Hematologic/Lymphatic: Easy bruising  Cardiovascular : Negative for cardiovascular symptoms  Respiratory : Negative for respiratory symptoms  Endocrine: Negative for endocrine symptoms  Musculoskeletal: Negative for musculoskeletal symptoms  Neurological: Negative for neurological symptoms  Psychologic: Negative for psychiatric symptoms

## 2020-04-24 LAB — CYTOLOGY, URINE

## 2020-04-29 ENCOUNTER — Ambulatory Visit (INDEPENDENT_AMBULATORY_CARE_PROVIDER_SITE_OTHER): Payer: Medicare HMO | Admitting: *Deleted

## 2020-04-29 ENCOUNTER — Telehealth: Payer: Self-pay

## 2020-04-29 DIAGNOSIS — I4892 Unspecified atrial flutter: Secondary | ICD-10-CM | POA: Diagnosis not present

## 2020-04-29 DIAGNOSIS — Z5181 Encounter for therapeutic drug level monitoring: Secondary | ICD-10-CM

## 2020-04-29 LAB — POCT INR: INR: 2.9 (ref 2.0–3.0)

## 2020-04-29 NOTE — Patient Instructions (Signed)
Continue warfarin 1 1/2  tablets daily Recheck in 6 weeks 

## 2020-04-29 NOTE — Telephone Encounter (Signed)
-----   Message from Franchot Gallo, MD sent at 04/29/2020  3:02 PM EDT ----- Notify pt--cytology showed no cancer cells ----- Message ----- From: Dorisann Frames, RN Sent: 04/25/2020   8:56 AM EDT To: Franchot Gallo, MD  Please review

## 2020-04-29 NOTE — Telephone Encounter (Signed)
Pt was called and notified

## 2020-05-11 ENCOUNTER — Other Ambulatory Visit: Payer: Self-pay | Admitting: Cardiology

## 2020-05-14 ENCOUNTER — Other Ambulatory Visit: Payer: Self-pay | Admitting: Cardiology

## 2020-06-10 ENCOUNTER — Ambulatory Visit (INDEPENDENT_AMBULATORY_CARE_PROVIDER_SITE_OTHER): Payer: Medicare HMO | Admitting: *Deleted

## 2020-06-10 DIAGNOSIS — I4892 Unspecified atrial flutter: Secondary | ICD-10-CM

## 2020-06-10 DIAGNOSIS — Z5181 Encounter for therapeutic drug level monitoring: Secondary | ICD-10-CM | POA: Diagnosis not present

## 2020-06-10 LAB — POCT INR: INR: 3 (ref 2.0–3.0)

## 2020-06-10 NOTE — Patient Instructions (Signed)
Continue warfarin 1 1/2  tablets daily Recheck in 6 weeks 

## 2020-07-22 ENCOUNTER — Ambulatory Visit (INDEPENDENT_AMBULATORY_CARE_PROVIDER_SITE_OTHER): Payer: Medicare HMO | Admitting: *Deleted

## 2020-07-22 DIAGNOSIS — I4892 Unspecified atrial flutter: Secondary | ICD-10-CM

## 2020-07-22 DIAGNOSIS — Z5181 Encounter for therapeutic drug level monitoring: Secondary | ICD-10-CM

## 2020-07-22 LAB — POCT INR: INR: 2.9 (ref 2.0–3.0)

## 2020-07-22 NOTE — Patient Instructions (Signed)
Continue warfarin 1 1/2  tablets daily Recheck in 6 weeks 

## 2020-08-14 ENCOUNTER — Other Ambulatory Visit: Payer: Self-pay | Admitting: *Deleted

## 2020-08-14 MED ORDER — WARFARIN SODIUM 5 MG PO TABS: ORAL_TABLET | ORAL | 3 refills | Status: DC

## 2020-09-02 ENCOUNTER — Ambulatory Visit (INDEPENDENT_AMBULATORY_CARE_PROVIDER_SITE_OTHER): Payer: Medicare HMO | Admitting: *Deleted

## 2020-09-02 ENCOUNTER — Other Ambulatory Visit: Payer: Self-pay

## 2020-09-02 DIAGNOSIS — Z5181 Encounter for therapeutic drug level monitoring: Secondary | ICD-10-CM | POA: Diagnosis not present

## 2020-09-02 DIAGNOSIS — I4892 Unspecified atrial flutter: Secondary | ICD-10-CM

## 2020-09-02 LAB — POCT INR: INR: 2.7 (ref 2.0–3.0)

## 2020-09-02 NOTE — Patient Instructions (Signed)
Continue warfarin 1 1/2  tablets daily Recheck in 6 weeks 

## 2020-09-19 ENCOUNTER — Encounter: Payer: Self-pay | Admitting: Emergency Medicine

## 2020-09-19 ENCOUNTER — Ambulatory Visit
Admission: EM | Admit: 2020-09-19 | Discharge: 2020-09-19 | Disposition: A | Discharge status: Home / Self Care | Payer: Medicare HMO | Attending: Emergency Medicine | Admitting: Emergency Medicine

## 2020-09-19 ENCOUNTER — Other Ambulatory Visit: Payer: Self-pay

## 2020-09-19 DIAGNOSIS — M5432 Sciatica, left side: Secondary | ICD-10-CM | POA: Diagnosis not present

## 2020-09-19 MED ORDER — PREDNISONE 10 MG PO TABS: 20.0000 mg | ORAL_TABLET | Freq: Every day | ORAL | 0 refills | Status: DC

## 2020-09-19 MED ORDER — CYCLOBENZAPRINE HCL 5 MG PO TABS: 5.0000 mg | ORAL_TABLET | Freq: Every day | ORAL | 0 refills | Status: DC

## 2020-09-19 NOTE — ED Provider Notes (Signed)
Shane [name]   [id number] 09/19/20 Arrival Time: 1107   Chief Complaint  Patient presents with  . Back Pain     SUBJECTIVE: History from: patient and family.  AUTHUR CUBIT is a 87 y.o. male who presented to the urgent care with a complaint of lower back and left leg pain that started this morning.  Developed the symptoms after working on a ladder.  He localizes the pain to the low back and left leg.  He describes the pain as constant and achy.  He has tried OTC medications without relief.  His symptoms are made worse with ROM.  He denies similar symptoms in the past.  Denies chills, fever, nausea, vomiting, diarrhea, paresthesia, confusion.   ROS: As per HPI.  All other pertinent ROS negative.      Past Medical History:  Diagnosis Date  . Arteriosclerotic cardiovascular disease (ASCVD)    CABG surgery in 1990. MI in 1986; coronary angiography in 2003-critical LAD with patent LIMA; total obstruction of the RCA with patent RIMA; low normal EF.  [name] Atrial flutter, paroxysmal ([place])    asymptomatic; onset in 2010; 4:1 AVB with low-dose metoprolol; moderate left      atrial enlargement and mild left ventricular hypertrophy with normal ejection fraction by echocardiography  . Benign prostatic hypertrophy   . Bradycardia    a. requiring cessation of beta blocker 12/2016.  [name] Chronic anticoagulation   . Hyperlipidemia    Lipid profile in 09/2010:196, 84, 54, 125  . Mobitz type 1 second degree atrioventricular block 12/17/2016  . Nephrolithiasis   . Polymyalgia rheumatica (HCC)    Past Surgical History:  Procedure Laterality Date  . COLONOSCOPY  08/21/01   RMR: Left-sided diverticulum.  Remainder of colonic mucosa appeared normal  . COLONOSCOPY N/A 07/31/2013   Procedure: COLONOSCOPY;  Surgeon: Danie Binder, MD;  [facility];  Service: Endoscopy;  Laterality: N/A;  10:30  . CORONARY ARTERY BYPASS GRAFT  1990  . LUMBAR DISC SURGERY    . TRANSURETHRAL RESECTION  OF BLADDER TUMOR N/A 03/01/2017   Procedure: TRANSURETHRAL RESECTION OF BLADDER TUMOR (TURBT);  Surgeon: Franchot Gallo, MD;  Location: [facility];  Service: Urology;  Laterality: N/A;  . TRANSURETHRAL RESECTION OF BLADDER TUMOR N/A 04/12/2017   Procedure: REPEAT TRANSURETHRAL RESECTION OF BLADDER TUMOR (TURBT);  Surgeon: Franchot Gallo, MD;  Location: [facility];  Service: Urology;  Laterality: N/A;  1 HR [phone] [employer] [place]   No Known Allergies No current facility-administered medications on file prior to encounter.   Current Outpatient Medications on File Prior to Encounter  Medication Sig Dispense Refill  . acetaminophen (TYLENOL) 500 MG tablet Take 1,000 mg by mouth every 8 (eight) hours as needed for mild pain.    [name] atorvastatin (LIPITOR) 80 MG tablet TAKE 1 TABLET BY MOUTH ONCE DAILY IN THE EVENING 90 tablet 1  . fluticasone (FLONASE) 50 MCG/ACT nasal spray Place into both nostrils.    . Tamsulosin HCl (FLOMAX) 0.4 MG CAPS Take 0.4 mg by mouth daily after supper.     . warfarin (COUMADIN) 5 MG tablet TAKE 1 & 1/2 (ONE & ONE-HALF) TABLETS BY MOUTH ONCE DAILY AT  6PM  OR  AS  DIRECTED  BY  COUMADIN  CLINIC 145 tablet 3   Social History   Socioeconomic History  . Marital status: Married    Spouse name: Not on file  . Number of children: 3  . Years of education: Not on file  . Highest  education level: Not on file  Occupational History  . Occupation: Retail  Tobacco Use  . Smoking status: Former Smoker    Packs/day: 1.50    Years: 25.00    Pack years: 20.50    Quit date: 10/07/1982    Years since quitting: 37.9  . Smokeless tobacco: Never Used  Vaping Use  . Vaping Use: Never used  Substance and Sexual Activity  . Alcohol use: Yes    Comment: 1 bottle of wine weekly; occasional beer  . Drug use: No  . Sexual activity: Not on file  Other Topics Concern  . Not on file  Social History Narrative  . Not on file   Social Determinants of Health   Financial  Resource Strain: Not on file  Food Insecurity: Not on file  Transportation Needs: Not on file  Physical Activity: Not on file  Stress: Not on file  Social Connections: Not on file  Intimate Partner Violence: Not on file   Family History  Problem Relation Age of Onset  . Heart attack Mother   . Coronary artery disease Brother        CABG + pacemaker  . Pneumonia Brother        infant death  . Colon cancer Neg Hx   . Colon polyps Neg Hx     OBJECTIVE:  Vitals:   09/19/20 1144 09/19/20 1145  BP: 133/75   Pulse: 70   Resp: 18   Temp: 97.7 F (36.5 C)   TempSrc: Oral   SpO2: 94%   Weight:  156 lb (70.8 kg)  Height:  5\' 8"  (1.727 m)    Physical Exam Vitals and nursing note reviewed.  Constitutional:      General: He is not in acute distress.    Appearance: Normal appearance. He is normal weight. He is not ill-appearing, toxic-appearing or diaphoretic.  Cardiovascular:     Rate and Rhythm: Normal rate and regular rhythm.     Pulses: Normal pulses.     Heart sounds: Normal heart sounds. No murmur heard. No friction rub. No gallop.   Pulmonary:     Effort: Pulmonary effort is normal. No respiratory distress.     Breath sounds: Normal breath sounds. No stridor. No wheezing, rhonchi or rales.  Chest:     Chest wall: No tenderness.  Musculoskeletal:     Cervical back: Normal.     Thoracic back: Normal.     Lumbar back: Spasms and tenderness present.     Comments: Back:  Patient ambulates from chair to exam table without difficulty.  Inspection: Skin clear and intact without obvious swelling, erythema, or ecchymosis. Warm to the touch  Palpation: Vertebral processes nontender.  ROM: FROM Strength: 5/5 hip flexion, 5/5 knee extension, 5/5 knee flexion, 5/5 plantar flexion, 5/5 dorsiflexion   Neurological:     Mental Status: He is alert and oriented to person, place, and time.      LABS:  No results found for this or any previous visit (from the past 24 hour(s)).    ASSESSMENT & PLAN:  1. Sciatica of left side     Meds ordered this encounter  Medications  . predniSONE (DELTASONE) 10 MG tablet    Sig: Take 2 tablets (20 mg total) by mouth daily.    Dispense:  15 tablet    Refill:  0  . cyclobenzaprine (FLEXERIL) 5 MG tablet    Sig: Take 1 tablet (5 mg total) by mouth at bedtime.    Dispense:  30 tablet    Refill:  0    Discharge instructions   Rest, ice and heat as needed Ensure adequate ROM as tolerated. Continue to use OTC medication as needed for pain Prescribed prednisone/take as directed Prescribed flexeril  for muscle spasm.  Do not drive or operate heavy machinery while taking this medication Return here or go to ER if you have any new or worsening symptoms such as numbness/tingling of the inner thighs, loss of bladder or bowel control, headache/blurry vision, nausea/vomiting, confusion/altered mental status, dizziness, weakness, passing out, imbalance, etc...    Reviewed expectations re: course of current medical issues. Questions answered. Outlined signs and symptoms indicating need for more acute intervention. Patient verbalized understanding. After Visit Summary given.         Emerson Monte, FNP 09/19/20 1240

## 2020-09-19 NOTE — Discharge Instructions (Addendum)
Rest, ice and heat as needed Ensure adequate ROM as tolerated. Continue to use OTC medication as needed for pain Prescribed prednisone/take as directed Prescribed flexeril  for muscle spasm.  Do not drive or operate heavy machinery while taking this medication Return here or go to ER if you have any new or worsening symptoms such as numbness/tingling of the inner thighs, loss of bladder or bowel control, headache/blurry vision, nausea/vomiting, confusion/altered mental status, dizziness, weakness, passing out, imbalance, etc..[name]

## 2020-09-19 NOTE — ED Triage Notes (Signed)
Was working on a ladder and the next morning had pain in left leg and lower back

## 2020-10-02 ENCOUNTER — Ambulatory Visit: Payer: Medicare HMO | Admitting: Cardiology

## 2020-10-02 NOTE — Progress Notes (Deleted)
Clinical Summary Shane [name] is a 87 y.o.male seen today for follow up of the following medical problems.   1. CAD - prior CABG in 1990 - last cath 2003 as reported below. - Jan 2010 echo LVEF 55-60%    - no recent chest pain. No SOB or DOE.  - compliant with meds - walks several miles a week without troubles.   2. Hyperlipidemia - compliant with statin  - labs followed by pcp  3. Aflutter - no recent palpitations - intermittent low heart rates. EKG 8/7 showed sinus brady, wenchebach block.  - no palpitations.    5. Chronic bradycardia/AV block - 2:1 AV block noted on holter. FOllowed by EP for mobitz type I undergoing watchful waiting.   - no recent lightheadedness or dizziness, no presyncope or syncope   SH: married 63 years.Son is an ob/gyn in [employer], another son is a English as a second language teacher who lived in [place] 5 years moving to [place].  SH: works at [employer].    Past Medical History:  Diagnosis Date  . Arteriosclerotic cardiovascular disease (ASCVD)    CABG surgery in 1990. MI in 1986; coronary angiography in 2003-critical LAD with patent LIMA; total obstruction of the RCA with patent RIMA; low normal EF.  [name] Atrial flutter, paroxysmal ([place])    asymptomatic; onset in 2010; 4:1 AVB with low-dose metoprolol; moderate left      atrial enlargement and mild left ventricular hypertrophy with normal ejection fraction by echocardiography  . Benign prostatic hypertrophy   . Bradycardia    a. requiring cessation of beta blocker 12/2016.  [name] Chronic anticoagulation   . Hyperlipidemia    Lipid profile in 09/2010:196, 84, 54, 125  . Mobitz type 1 second degree atrioventricular block 12/17/2016  . Nephrolithiasis   . Polymyalgia rheumatica (HCC)      No Known Allergies   Current Outpatient Medications  Medication Sig Dispense Refill  . acetaminophen (TYLENOL) 500 MG tablet Take 1,000 mg by mouth every 8 (eight) hours as needed for mild pain.    [name] atorvastatin  (LIPITOR) 80 MG tablet TAKE 1 TABLET BY MOUTH ONCE DAILY IN THE EVENING 90 tablet 1  . cyclobenzaprine (FLEXERIL) 5 MG tablet Take 1 tablet (5 mg total) by mouth at bedtime. 30 tablet 0  . fluticasone (FLONASE) 50 MCG/ACT nasal spray Place into both nostrils.    . predniSONE (DELTASONE) 10 MG tablet Take 2 tablets (20 mg total) by mouth daily. 15 tablet 0  . Tamsulosin HCl (FLOMAX) 0.4 MG CAPS Take 0.4 mg by mouth daily after supper.     . warfarin (COUMADIN) 5 MG tablet TAKE 1 & 1/2 (ONE & ONE-HALF) TABLETS BY MOUTH ONCE DAILY AT  6PM  OR  AS  DIRECTED  BY  COUMADIN  CLINIC 145 tablet 3   No current facility-administered medications for this visit.     Past Surgical History:  Procedure Laterality Date  . COLONOSCOPY  08/21/01   RMR: Left-sided diverticulum.  Remainder of colonic mucosa appeared normal  . COLONOSCOPY N/A 07/31/2013   Procedure: COLONOSCOPY;  Surgeon: Danie Binder, MD;  [facility];  Service: Endoscopy;  Laterality: N/A;  10:30  . CORONARY ARTERY BYPASS GRAFT  1990  . LUMBAR DISC SURGERY    . TRANSURETHRAL RESECTION OF BLADDER TUMOR N/A 03/01/2017   Procedure: TRANSURETHRAL RESECTION OF BLADDER TUMOR (TURBT);  Surgeon: Franchot Gallo, MD;  Location: [facility];  Service: Urology;  Laterality: N/A;  . TRANSURETHRAL RESECTION OF BLADDER  TUMOR N/A 04/12/2017   Procedure: REPEAT TRANSURETHRAL RESECTION OF BLADDER TUMOR (TURBT);  Surgeon: Franchot Gallo, MD;  Location: [facility];  Service: Urology;  Laterality: N/A;  1 HR [phone] [employer] [place]     No Known Allergies    Family History  Problem Relation Age of Onset  . Heart attack Mother   . Coronary artery disease Brother        CABG + pacemaker  . Pneumonia Brother        infant death  . Colon cancer Neg Hx   . Colon polyps Neg Hx      Social History Mr. Silsby reports that he quit smoking about 38 years ago. He has a 37.50 pack-year smoking history. He has never used smokeless  tobacco. Mr. Fieldhouse reports current alcohol use.   Review of Systems CONSTITUTIONAL: No weight loss, fever, chills, weakness or fatigue.  HEENT: Eyes: No visual loss, blurred vision, double vision or yellow sclerae.No hearing loss, sneezing, congestion, runny nose or sore throat.  SKIN: No rash or itching.  CARDIOVASCULAR:  RESPIRATORY: No shortness of breath, cough or sputum.  GASTROINTESTINAL: No anorexia, nausea, vomiting or diarrhea. No abdominal pain or blood.  GENITOURINARY: No burning on urination, no polyuria NEUROLOGICAL: No headache, dizziness, syncope, paralysis, ataxia, numbness or tingling in the extremities. No change in bowel or bladder control.  MUSCULOSKELETAL: No muscle, back pain, joint pain or stiffness.  LYMPHATICS: No enlarged nodes. No history of splenectomy.  PSYCHIATRIC: No history of depression or anxiety.  ENDOCRINOLOGIC: No reports of sweating, cold or heat intolerance. No polyuria or polydipsia.  [name]   Physical Examination There were no vitals filed for this visit. There were no vitals filed for this visit.  Gen: resting comfortably, no acute distress HEENT: no scleral icterus, pupils equal round and reactive, no palptable cervical adenopathy,  CV Resp: Clear to auscultation bilaterally GI: abdomen is soft, non-tender, non-distended, normal bowel sounds, no hepatosplenomegaly MSK: extremities are warm, no edema.  Skin: warm, no rash Neuro:  no focal deficits Psych: appropriate affect   Diagnostic Studies  Cath 07/2002 FINDINGS:  1. Left main trunk. Medium caliber vessel with mild ________.  2. LAD. This is a medium caliber vessel which supplies a trivial first  diagonal Shane [name] proximal segment, medium caliber second diagonal Shane [name]  thereafter. The LAD then extends to the apex. The LAD has moderate  disease, 50% of the proximal left segment, which then extends into a  narrowing of 70% encompassing the trivial diagonal Shane [name]. The distal   LAD has mild irregularities and is seen to fill predominantly via the  LIMA graft. There is mid narrowing of 30%. The second diagonal Shane [name]  has an ostial narrowing of 50% and fills predominantly via antegrade  flow.  3. Left circumflex artery. This is a medium caliber vessel that supplies a  small first marginal Shane [name] and the proximal segment a larger second  marginal Shane [name]. In the mid-section, there is moderate narrowing of 30-  40% of the proximal segment of the second marginal Shane [name].  4. Right coronary artery. This was a dominant medium caliber vessel that  supplies the posterior descending artery and a posterior ventricular  Shane [name] in its terminal segment. The right coronary artery is 100%  occluded in the mid-section. The distal vessel fills the in situ RIMA  graft anastomosed to the distal right coronary artery. The posterior  descending artery and the posterior ventricular Shane [name] have mild _______  of 30%.  5. RIMA to the  distal right coronary artery is patent. This was an in situ  graft.  6. LIMA to the LAD is patent. This was also an in situ graft.  7. Left ventricle. Normal end-systolic and end-diastolic dimensions.  Normal left ventricular function is well preserved. Ejection fraction 50-  55%. No mitral regurgitation. LV pressure is 120/5. Aortic is 120/65.  LVEDP is 15.  ASSESSMENT AND PLAN: The patient is a 87 year old gentleman with two-vessel  coronary artery disease that is well revascularized surgically. He has well-  preserved LV function. The only concern is the second diagonal Shane [name] which  is compromised by moderate disease in the proximal and mid-LAD. This does  not appear to be critical in nature; however, this disease is presumptively  the reason the patient underwent bypass surgery. In addition, the amount of  myocardium supplied by this diagonal Shane [name] is relatively small and is  unlikely to elicit the symptoms the patient  presented with. We will thus  pursue a conservative course of medical therapy should the patient have  recurrent symptoms or an abnormal stress imaging study. Percutaneous  intervention may be considered to improve flow to the second diagonal  Shane [name].   Jan 2010 Echo SUMMARY - Overall left ventricular systolic function was normal. Left ventricular ejection fraction was estimated , range being 55 % to 60 %. There were no left ventricular regional wall motion abnormalities. Left ventricular wall thickness was mildly increased. There was moderate basal septal hypertrophy. - The aortic valve was mildly calcified. - There was mild fibrocalcific change of the aortic root. - The effective orifice of mitral regurgitation by proximal isovelocity surface area was 0.13 cm^2. The volume of mitral regurgitation by proximal isovelocity surface area was 19 cc. - The left atrium was moderately dilated. - There was mild right ventricular hypertrophy. - The right atrium was mild to moderately dilated.   Assessment and Plan  1. CAD - no symptoms, continue current meds  2. Hyperlipidemia - request labs from pcp, continue statin  3. Aflutter - has not been on av nodal agent due to bradycardia - not interested in NOACs, he will continue coumadin - continue current therapy.   4. Bradycardia/AV block 2nd degreee -Has been evaluated by EP - watchful waiting, remains asymptomatic.      Arnoldo Lenis, M.D.

## 2020-10-16 ENCOUNTER — Ambulatory Visit (INDEPENDENT_AMBULATORY_CARE_PROVIDER_SITE_OTHER): Payer: Medicare HMO | Admitting: *Deleted

## 2020-10-16 DIAGNOSIS — Z5181 Encounter for therapeutic drug level monitoring: Secondary | ICD-10-CM

## 2020-10-16 DIAGNOSIS — I4892 Unspecified atrial flutter: Secondary | ICD-10-CM

## 2020-10-16 LAB — POCT INR: INR: 3.7 — AB (ref 2.0–3.0)

## 2020-10-16 NOTE — Patient Instructions (Signed)
Took prednisone taper for leg pain Hold warfarin tonight then resume 1 1/2 tablets daily.  Eat extra 1-2 serving of greens Recheck in 3 weeks

## 2020-11-06 ENCOUNTER — Ambulatory Visit: Payer: Medicare HMO | Admitting: Cardiology

## 2020-11-06 ENCOUNTER — Ambulatory Visit (INDEPENDENT_AMBULATORY_CARE_PROVIDER_SITE_OTHER): Payer: Medicare HMO | Admitting: *Deleted

## 2020-11-06 DIAGNOSIS — I4892 Unspecified atrial flutter: Secondary | ICD-10-CM

## 2020-11-06 DIAGNOSIS — Z5181 Encounter for therapeutic drug level monitoring: Secondary | ICD-10-CM

## 2020-11-06 LAB — POCT INR: INR: 2.3 (ref 2.0–3.0)

## 2020-11-06 NOTE — Patient Instructions (Signed)
Continue warfarin 1 1/2 tablets daily.  Eat extra 1-2 serving of greens Recheck in 4 weeks   

## 2020-11-25 ENCOUNTER — Ambulatory Visit: Payer: Medicare HMO | Admitting: Cardiology

## 2020-11-25 ENCOUNTER — Other Ambulatory Visit: Payer: Self-pay

## 2020-11-25 ENCOUNTER — Encounter: Payer: Self-pay | Admitting: Cardiology

## 2020-11-25 VITALS — BP 142/74 | HR 74 | Ht 68.0 in | Wt 169.0 lb

## 2020-11-25 DIAGNOSIS — E782 Mixed hyperlipidemia: Secondary | ICD-10-CM

## 2020-11-25 DIAGNOSIS — I4892 Unspecified atrial flutter: Secondary | ICD-10-CM | POA: Diagnosis not present

## 2020-11-25 DIAGNOSIS — I441 Atrioventricular block, second degree: Secondary | ICD-10-CM

## 2020-11-25 DIAGNOSIS — I2510 Atherosclerotic heart disease of native coronary artery without angina pectoris: Secondary | ICD-10-CM | POA: Diagnosis not present

## 2020-11-25 NOTE — Patient Instructions (Signed)
Medication Instructions:  Your physician recommends that you continue on your current medications as directed. Please refer to the Current Medication list given to you today.  *If you need a refill on your cardiac medications before your next appointment, please call your pharmacy*   Lab Work: None If you have labs (blood work) drawn today and your tests are completely normal, you will receive your results only by: [name] MyChart Message (if you have MyChart) OR . A paper copy in the mail If you have any lab test that is abnormal or we need to change your treatment, we will call you to review the results.   Testing/Procedures: None   Follow-Up: At [facility], you and your health needs are our priority.  As part of our continuing mission to provide you with exceptional heart care, we have created designated Provider Care Teams.  These Care Teams include your primary Cardiologist (physician) and Advanced Practice Providers (APPs -  Physician Assistants and Nurse Practitioners) who all work together to provide you with the care you need, when you need it.  We recommend signing up for the patient portal called "MyChart".  Sign up information is provided on this After Visit Summary.  MyChart is used to connect with patients for Virtual Visits (Telemedicine).  Patients are able to view lab/test results, encounter notes, upcoming appointments, etc.  Non-urgent messages can be sent to your provider as well.   To learn more about what you can do with MyChart, go to [email or web address].    Your next appointment:   12 month(s)  The format for your next appointment:   In Person  Provider:   Carlyle Dolly, MD   Other Instructions

## 2020-11-25 NOTE — Progress Notes (Addendum)
Clinical Summary Shane [name] is a 87 y.o.male seen today for follow up of the following medical problems.   1. CAD - prior CABG in 1990 - last cath 2003 as reported below. - Jan 2010 echo LVEF 55-60%    - no recent chest pain. No SOB/DOe - compliant w meds - walkd regularly several miles without troubles.    2. Hyperlipidemia  - upcoming labs with pcp - he is compliant with statin   3. Aflutter - no recent palpitations - no bleeding on coumadin.  - has not required av nodal agents   5. Chronic bradycardia/AV block - 2:1 AV block noted on holter. FOllowed by EP for mobitz type I undergoing watchful waiting.   - no lightheadedness or dizziness.    SH: married 61 years.Son is an ob/gyn in [employer], another son is a English as a second language teacher who lived in [place] 5 years moving to [place].  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    Past Medical History:  Diagnosis Date  . Arteriosclerotic cardiovascular disease (ASCVD)    CABG surgery in 1990. MI in 1986; coronary angiography in 2003-critical LAD with patent LIMA; total obstruction of the RCA with patent RIMA; low normal EF.  [name] Atrial flutter, paroxysmal ([place])    asymptomatic; onset in 2010; 4:1 AVB with low-dose metoprolol; moderate left      atrial enlargement and mild left ventricular hypertrophy with normal ejection fraction by echocardiography  . Benign prostatic hypertrophy   . Bradycardia    a. requiring cessation of beta blocker 12/2016.  [name] Chronic anticoagulation   . Hyperlipidemia    Lipid profile in 09/2010:196, 84, 54, 125  . Mobitz type 1 second degree atrioventricular block 12/17/2016  . Nephrolithiasis   . Polymyalgia rheumatica (HCC)      No Known Allergies   Current Outpatient Medications  Medication Sig Dispense Refill  . acetaminophen (TYLENOL) 500 MG tablet Take 1,000 mg by mouth every 8 (eight) hours as needed for mild pain.    [name] atorvastatin (LIPITOR) 80 MG tablet TAKE 1  TABLET BY MOUTH ONCE DAILY IN THE EVENING 90 tablet 1  . cyclobenzaprine (FLEXERIL) 5 MG tablet Take 1 tablet (5 mg total) by mouth at bedtime. 30 tablet 0  . fluticasone (FLONASE) 50 MCG/ACT nasal spray Place into both nostrils.    . predniSONE (DELTASONE) 10 MG tablet Take 2 tablets (20 mg total) by mouth daily. 15 tablet 0  . Tamsulosin HCl (FLOMAX) 0.4 MG CAPS Take 0.4 mg by mouth daily after supper.     . warfarin (COUMADIN) 5 MG tablet TAKE 1 & 1/2 (ONE & ONE-HALF) TABLETS BY MOUTH ONCE DAILY AT  6PM  OR  AS  DIRECTED  BY  COUMADIN  CLINIC 145 tablet 3   No current facility-administered medications for this visit.     Past Surgical History:  Procedure Laterality Date  . COLONOSCOPY  08/21/01   RMR: Left-sided diverticulum.  Remainder of colonic mucosa appeared normal  . COLONOSCOPY N/A 07/31/2013   Procedure: COLONOSCOPY;  Surgeon: Danie Binder, MD;  [facility];  Service: Endoscopy;  Laterality: N/A;  10:30  . CORONARY ARTERY BYPASS GRAFT  1990  . LUMBAR DISC SURGERY    . TRANSURETHRAL RESECTION OF BLADDER TUMOR N/A 03/01/2017   Procedure: TRANSURETHRAL RESECTION OF BLADDER TUMOR (TURBT);  Surgeon: Franchot Gallo, MD;  Location: [facility];  Service: Urology;  Laterality: N/A;  . TRANSURETHRAL RESECTION OF BLADDER  TUMOR N/A 04/12/2017   Procedure: REPEAT TRANSURETHRAL RESECTION OF BLADDER TUMOR (TURBT);  Surgeon: Franchot Gallo, MD;  Location: [facility];  Service: Urology;  Laterality: N/A;  1 HR [phone] [employer] [place]     No Known Allergies    Family History  Problem Relation Age of Onset  . Heart attack Mother   . Coronary artery disease Brother        CABG + pacemaker  . Pneumonia Brother        infant death  . Colon cancer Neg Hx   . Colon polyps Neg Hx      Social History Shane [name] reports that he quit smoking about 38 years ago. He has a 37.50 pack-year smoking history. He has never used smokeless tobacco. Shane [name] reports current  alcohol use.   Review of Systems CONSTITUTIONAL: No weight loss, fever, chills, weakness or fatigue.  HEENT: Eyes: No visual loss, blurred vision, double vision or yellow sclerae.No hearing loss, sneezing, congestion, runny nose or sore throat.  SKIN: No rash or itching.  CARDIOVASCULAR: per hpi RESPIRATORY: No shortness of breath, cough or sputum.  GASTROINTESTINAL: No anorexia, nausea, vomiting or diarrhea. No abdominal pain or blood.  GENITOURINARY: No burning on urination, no polyuria NEUROLOGICAL: No headache, dizziness, syncope, paralysis, ataxia, numbness or tingling in the extremities. No change in bowel or bladder control.  MUSCULOSKELETAL: No muscle, back pain, joint pain or stiffness.  LYMPHATICS: No enlarged nodes. No history of splenectomy.  PSYCHIATRIC: No history of depression or anxiety.  ENDOCRINOLOGIC: No reports of sweating, cold or heat intolerance. No polyuria or polydipsia.  [name]   Physical Examination Today's Vitals   11/25/20 1438  BP: (!) 142/74  Pulse: 74  SpO2: 95%  Weight: 169 lb (76.7 kg)  Height: 5\' 8"  (1.727 m)   Body mass index is 25.7 kg/m.  Gen: resting comfortably, no acute distress HEENT: no scleral icterus, pupils equal round and reactive, no palptable cervical adenopathy,  CV: RRR, no m/r/g, no jvd Resp: Clear to auscultation bilaterally GI: abdomen is soft, non-tender, non-distended, normal bowel sounds, no hepatosplenomegaly MSK: extremities are warm, no edema.  Skin: warm, no rash Neuro:  no focal deficits Psych: appropriate affect   Diagnostic Studies Cath 07/2002 FINDINGS:  1. Left main trunk. Medium caliber vessel with mild ________.  2. LAD. This is a medium caliber vessel which supplies a trivial first  diagonal branch proximal segment, medium caliber second diagonal branch  thereafter. The LAD then extends to the apex. The LAD has moderate  disease, 50% of the proximal left segment, which then extends into a   narrowing of 70% encompassing the trivial diagonal branch. The distal  LAD has mild irregularities and is seen to fill predominantly via the  LIMA graft. There is mid narrowing of 30%. The second diagonal branch  has an ostial narrowing of 50% and fills predominantly via antegrade  flow.  3. Left circumflex artery. This is a medium caliber vessel that supplies a  small first marginal branch and the proximal segment a larger second  marginal branch. In the mid-section, there is moderate narrowing of 30-  40% of the proximal segment of the second marginal branch.  4. Right coronary artery. This was a dominant medium caliber vessel that  supplies the posterior descending artery and a posterior ventricular  branch in its terminal segment. The right coronary artery is 100%  occluded in the mid-section. The distal vessel fills the in situ RIMA  graft anastomosed to the  distal right coronary artery. The posterior  descending artery and the posterior ventricular branch have mild _______  of 30%.  5. RIMA to the distal right coronary artery is patent. This was an in situ  graft.  6. LIMA to the LAD is patent. This was also an in situ graft.  7. Left ventricle. Normal end-systolic and end-diastolic dimensions.  Normal left ventricular function is well preserved. Ejection fraction 50-  55%. No mitral regurgitation. LV pressure is 120/5. Aortic is 120/65.  LVEDP is 15.  ASSESSMENT AND PLAN: The patient is a 87 year old gentleman with two-vessel  coronary artery disease that is well revascularized surgically. He has well-  preserved LV function. The only concern is the second diagonal branch which  is compromised by moderate disease in the proximal and mid-LAD. This does  not appear to be critical in nature; however, this disease is presumptively  the reason the patient underwent bypass surgery. In addition, the amount of  myocardium supplied by this diagonal branch is  relatively small and is  unlikely to elicit the symptoms the patient presented with. We will thus  pursue a conservative course of medical therapy should the patient have  recurrent symptoms or an abnormal stress imaging study. Percutaneous  intervention may be considered to improve flow to the second diagonal  branch.   Jan 2010 Echo SUMMARY - Overall left ventricular systolic function was normal. Left ventricular ejection fraction was estimated , range being 55 % to 60 %. There were no left ventricular regional wall motion abnormalities. Left ventricular wall thickness was mildly increased. There was moderate basal septal hypertrophy. - The aortic valve was mildly calcified. - There was mild fibrocalcific change of the aortic root. - The effective orifice of mitral regurgitation by proximal isovelocity surface area was 0.13 cm^2. The volume of mitral regurgitation by proximal isovelocity surface area was 19 cc. - The left atrium was moderately dilated. - There was mild right ventricular hypertrophy. - The right atrium was mild to moderately dilated.    Assessment and Plan  1. CAD - no symptoms, continue current meds  2. Hyperlipidemia - continue statin, request pcp labs  3. Aflutter - has not been on av nodal agent due to bradycardia - not interested in NOACs, remains on coumaidn - no symptoms, continue current meds - EKG today shows sinus rhythm with 1st degree AV block  4. Bradycardia/AV block 2nd degreee -Has been evaluated by EP - watchful waiting - no recent symptoms, continue to monitor   F/u 1 year.       Arnoldo Lenis, M.D.

## 2020-12-04 ENCOUNTER — Ambulatory Visit (INDEPENDENT_AMBULATORY_CARE_PROVIDER_SITE_OTHER): Payer: Medicare HMO | Admitting: *Deleted

## 2020-12-04 DIAGNOSIS — Z5181 Encounter for therapeutic drug level monitoring: Secondary | ICD-10-CM

## 2020-12-04 DIAGNOSIS — I4892 Unspecified atrial flutter: Secondary | ICD-10-CM | POA: Diagnosis not present

## 2020-12-04 LAB — POCT INR: INR: 2.5 (ref 2.0–3.0)

## 2020-12-04 NOTE — Patient Instructions (Signed)
Continue warfarin 1 1/2 tablets daily.  Eat extra 1-2 serving of greens Recheck in 4 weeks

## 2021-01-13 ENCOUNTER — Ambulatory Visit (INDEPENDENT_AMBULATORY_CARE_PROVIDER_SITE_OTHER): Payer: Medicare HMO | Admitting: *Deleted

## 2021-01-13 DIAGNOSIS — Z5181 Encounter for therapeutic drug level monitoring: Secondary | ICD-10-CM

## 2021-01-13 DIAGNOSIS — I4892 Unspecified atrial flutter: Secondary | ICD-10-CM | POA: Diagnosis not present

## 2021-01-13 LAB — POCT INR: INR: 2.9 (ref 2.0–3.0)

## 2021-01-13 NOTE — Patient Instructions (Signed)
Continue warfarin 1 1/2 tablets daily.  Eat extra 1-2 serving of greens Recheck in 6 weeks

## 2021-02-03 ENCOUNTER — Other Ambulatory Visit: Payer: Self-pay | Admitting: Cardiology

## 2021-03-04 ENCOUNTER — Ambulatory Visit (INDEPENDENT_AMBULATORY_CARE_PROVIDER_SITE_OTHER): Payer: Medicare HMO | Admitting: *Deleted

## 2021-03-04 DIAGNOSIS — Z5181 Encounter for therapeutic drug level monitoring: Secondary | ICD-10-CM | POA: Diagnosis not present

## 2021-03-04 DIAGNOSIS — I4892 Unspecified atrial flutter: Secondary | ICD-10-CM | POA: Diagnosis not present

## 2021-03-04 LAB — POCT INR: INR: 3.2 — AB (ref 2.0–3.0)

## 2021-03-04 NOTE — Patient Instructions (Signed)
Take warfarin 1/2 tablet tonight then resume 1 1/2 tablets daily.  Eat extra 1-2 serving of greens Recheck in 6 weeks

## 2021-04-16 ENCOUNTER — Ambulatory Visit (INDEPENDENT_AMBULATORY_CARE_PROVIDER_SITE_OTHER): Payer: Medicare HMO | Admitting: *Deleted

## 2021-04-16 DIAGNOSIS — Z5181 Encounter for therapeutic drug level monitoring: Secondary | ICD-10-CM | POA: Diagnosis not present

## 2021-04-16 DIAGNOSIS — I4892 Unspecified atrial flutter: Secondary | ICD-10-CM | POA: Diagnosis not present

## 2021-04-16 LAB — POCT INR: INR: 2.9 (ref 2.0–3.0)

## 2021-04-16 NOTE — Patient Instructions (Signed)
Continue warfarin 1 1/2 tablets daily.  Continue greens Recheck in 6 weeks

## 2021-04-21 ENCOUNTER — Other Ambulatory Visit: Payer: Medicare HMO | Admitting: Urology

## 2021-04-21 DIAGNOSIS — N138 Other obstructive and reflux uropathy: Secondary | ICD-10-CM

## 2021-04-21 DIAGNOSIS — C675 Malignant neoplasm of bladder neck: Secondary | ICD-10-CM

## 2021-04-22 ENCOUNTER — Other Ambulatory Visit: Payer: Self-pay

## 2021-04-22 ENCOUNTER — Ambulatory Visit
Admission: EM | Admit: 2021-04-22 | Discharge: 2021-04-22 | Disposition: A | Discharge status: Home / Self Care | Payer: Medicare HMO | Attending: Family Medicine | Admitting: Family Medicine

## 2021-04-22 ENCOUNTER — Encounter: Payer: Self-pay | Admitting: Emergency Medicine

## 2021-04-22 DIAGNOSIS — S51812A Laceration without foreign body of left forearm, initial encounter: Secondary | ICD-10-CM | POA: Diagnosis not present

## 2021-04-22 NOTE — ED Triage Notes (Signed)
Fell against a metal yard sign.  Cut of left forearm.  Bleeding controlled.  Unsure of when last tetanus shot was.

## 2021-04-23 NOTE — ED Provider Notes (Addendum)
[place] [facility]   IH:[id number] 04/22/21 Arrival Time: 1850  ASSESSMENT & PLAN:  1. Laceration of left forearm, initial encounter    Procedure: Verbal consent obtained. Patient provided with risks and alternatives to the procedure. Wound copiously irrigated with NS then cleansed with betadine. Local anesthesia: Lidocaine 2% with epinephrine. Wound carefully explored. No foreign body, tendon injury, or nonviable tissue were noted. Using sterile technique, 8 interrupted 4-0 Prolene sutures were placed to reapproximate the wound. Procedure tolerated well. No complications. Minimal bleeding. Advised to look for and return for any signs of infection such as redness, swelling, discharge, or worsening pain. Return for suture removal in 8-10 days.  See AVS for d/c information.  Reviewed expectations re: course of current medical issues. Questions answered. Outlined signs and symptoms indicating need for more acute intervention. Patient verbalized understanding. After Visit Summary given.   SUBJECTIVE:  Shane [name] is a 87 y.o. male who presents with a laceration of L prox forearm; today; hit on metal sign. Moderate bleeding. Normal distal sensation.    OBJECTIVE:  Vitals:   04/22/21 1857  BP: (!) 145/67  Pulse: 70  Resp: 16  Temp: 97.8 F (36.6 C)  TempSrc: Oral  SpO2: 94%     General appearance: alert; no distress Skin: v-shaped laceration of L prox forearm; size: approx 4 cm; clean wound edges, no foreign bodies; without active bleeding L arm with normal distal sensation and cap refill Psychological: alert and cooperative; normal mood and affect  Results for orders placed or performed in visit on 04/16/21  POCT INR  Result Value Ref Range   INR 2.9 2.0 - 3.0     No Known Allergies  Past Medical History:  Diagnosis Date   Arteriosclerotic cardiovascular disease (ASCVD)    CABG surgery in 1990. MI in 1986; coronary angiography in 2003-critical LAD with patent  LIMA; total obstruction of the RCA with patent RIMA; low normal EF.   Atrial flutter, paroxysmal ([place])    asymptomatic; onset in 2010; 4:1 AVB with low-dose metoprolol; moderate left      atrial enlargement and mild left ventricular hypertrophy with normal ejection fraction by echocardiography   Benign prostatic hypertrophy    Bradycardia    a. requiring cessation of beta blocker 12/2016.   Chronic anticoagulation    Hyperlipidemia    Lipid profile in 09/2010:196, 84, 54, 125   Mobitz type 1 second degree atrioventricular block 12/17/2016   Nephrolithiasis    Polymyalgia rheumatica ([place])    Social History   Socioeconomic History   Marital status: Married    Spouse name: Not on file   Number of children: 3   Years of education: Not on file   Highest education level: Not on file  Occupational History   Occupation: Retail  Tobacco Use   Smoking status: Former    Packs/day: 1.50    Years: 25.00    Pack years: 37.50    Types: Cigarettes    Quit date: 10/07/1982    Years since quitting: 38.5   Smokeless tobacco: Never  Vaping Use   Vaping Use: Never used  Substance and Sexual Activity   Alcohol use: Yes    Comment: 1 bottle of wine weekly; occasional beer   Drug use: No   Sexual activity: Not on file  Other Topics Concern   Not on file  Social History Narrative   Not on file   Social Determinants of Health   Financial Resource Strain: Not on file  Food  Insecurity: Not on file  Transportation Needs: Not on file  Physical Activity: Not on file  Stress: Not on file  Social Connections: Not on file          Vanessa Kick, MD 04/23/21 [place], [place], MD 04/23/21 1112

## 2021-04-30 ENCOUNTER — Other Ambulatory Visit: Payer: Self-pay

## 2021-04-30 ENCOUNTER — Ambulatory Visit
Admission: EM | Admit: 2021-04-30 | Discharge: 2021-04-30 | Disposition: A | Discharge status: Home / Self Care | Payer: Medicare HMO

## 2021-04-30 NOTE — ED Triage Notes (Signed)
Pt here for suture removal, 8 stiches removed, skin well approximated

## 2021-06-01 ENCOUNTER — Ambulatory Visit (INDEPENDENT_AMBULATORY_CARE_PROVIDER_SITE_OTHER): Payer: Medicare HMO | Admitting: *Deleted

## 2021-06-01 DIAGNOSIS — Z5181 Encounter for therapeutic drug level monitoring: Secondary | ICD-10-CM | POA: Diagnosis not present

## 2021-06-01 DIAGNOSIS — I4892 Unspecified atrial flutter: Secondary | ICD-10-CM

## 2021-06-01 LAB — POCT INR: INR: 3.2 — AB (ref 2.0–3.0)

## 2021-06-01 NOTE — Patient Instructions (Signed)
Take 1/2 tablet tonight then resume 1 1/2 tablets daily.  Continue greens Recheck in 6 weeks

## 2021-06-09 ENCOUNTER — Encounter: Payer: Self-pay | Admitting: Urology

## 2021-06-09 ENCOUNTER — Other Ambulatory Visit: Payer: Self-pay

## 2021-06-09 ENCOUNTER — Ambulatory Visit: Payer: Medicare HMO | Admitting: Urology

## 2021-06-09 VITALS — BP 168/74 | HR 69 | Temp 98.2°F | Ht 68.0 in | Wt 166.2 lb

## 2021-06-09 DIAGNOSIS — C675 Malignant neoplasm of bladder neck: Secondary | ICD-10-CM

## 2021-06-09 LAB — URINALYSIS, ROUTINE W REFLEX MICROSCOPIC
Bilirubin, UA: NEGATIVE
Glucose, UA: NEGATIVE
Ketones, UA: NEGATIVE
Leukocytes,UA: NEGATIVE
Nitrite, UA: NEGATIVE
Protein,UA: NEGATIVE
Specific Gravity, UA: 1.02 (ref 1.005–1.030)
Urobilinogen, Ur: 1 mg/dL (ref 0.2–1.0)
pH, UA: 7 (ref 5.0–7.5)

## 2021-06-09 LAB — MICROSCOPIC EXAMINATION
Bacteria, UA: NONE SEEN
Epithelial Cells (non renal): NONE SEEN /hpf (ref 0–10)
RBC, Urine: NONE SEEN /hpf (ref 0–2)
Renal Epithel, UA: NONE SEEN /hpf
WBC, UA: NONE SEEN /hpf (ref 0–5)

## 2021-06-09 MED ORDER — CIPROFLOXACIN HCL 500 MG PO TABS
500.0000 mg | ORAL_TABLET | Freq: Once | ORAL | Status: AC
  Administered 2021-06-09: 500 mg via ORAL

## 2021-06-09 NOTE — Addendum Note (Signed)
Addended by: Dorisann Frames on: 06/09/2021 02:45 PM   Modules accepted: Orders

## 2021-06-09 NOTE — Progress Notes (Signed)
Urological Symptom Review  Patient is experiencing the following symptoms: N/A   Review of Systems  Gastrointestinal (upper)  : Negative for upper GI symptoms  Gastrointestinal (lower) : Negative for lower GI symptoms  Constitutional : Negative for symptoms  Skin: Negative for skin symptoms  Eyes: Negative for eye symptoms  Ear/Nose/Throat : Negative for Ear/Nose/Throat symptoms  Hematologic/Lymphatic: Negative for Hematologic/Lymphatic symptoms  Cardiovascular : Negative for cardiovascular symptoms  Respiratory : Negative for respiratory symptoms  Endocrine: Negative for endocrine symptoms  Musculoskeletal: Negative for musculoskeletal symptoms  Neurological: Negative for neurological symptoms  Psychologic: Negative for psychiatric symptoms

## 2021-06-09 NOTE — Progress Notes (Signed)
H&P  Chief Complaint: Here for bladder cancer surveillance  History of Present Illness: He underwent TURBT of an extensive bladder neck/anterior bladder wall tumor on 6.26.2018. Mitomycin was left in postoperatively. 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   Repeat TURBT performed on 8.7.2018. Pathology revealed only benign inflammatory changes.   He completed 6 induction treatments of BCG on 10.16.2018.   11.27.2018--cysto showed moderate trabeculation, erythematous urothelium in the bladder neck/trigonal region, most likely related to prior resection and BCG. Cytology revealed no evidence of high grade urothelial carcinoma.   1.22.2019: completed 1st course of maintenance BCG.   4.2.2019: Cysto negative, cytology nondiagnostic (scant cellularity)   4.30.2019: Completed 2nd maintenance BCG   8.13.2019: No recent hematuria. Cystoscopy and cytology were negative.   9.11.2019: He completed his 3rd maintenance BCG series.   10.22.2019: Cysto negative, BCG Rx's discontinued.   5.12.2020: Cysto negative   11.17.2020: Cystoscopy negative  8.17.2021; Cystoscopy negative  10.4.2022: Here for cystoscopy.  He denies any gross hematuria or any urologic concerns Past Medical History:  Diagnosis Date   Arteriosclerotic cardiovascular disease (ASCVD)    CABG surgery in 1990. MI in 1986; coronary angiography in 2003-critical LAD with patent LIMA; total obstruction of the RCA with patent RIMA; low normal EF.   Atrial flutter, paroxysmal ([place])    asymptomatic; onset in 2010; 4:1 AVB with low-dose metoprolol; moderate left      atrial enlargement and mild left ventricular hypertrophy with normal ejection fraction by echocardiography   Benign prostatic hypertrophy    Bradycardia    a. requiring cessation of beta blocker 12/2016.   Chronic anticoagulation    Hyperlipidemia    Lipid profile in 09/2010:196, 84, 54, 125   Mobitz type 1 second degree atrioventricular block  12/17/2016   Nephrolithiasis    Polymyalgia rheumatica ([place])     Past Surgical History:  Procedure Laterality Date   COLONOSCOPY  08/21/01   RMR: Left-sided diverticulum.  Remainder of colonic mucosa appeared normal   COLONOSCOPY N/A 07/31/2013   Procedure: COLONOSCOPY;  Surgeon: Danie Binder, MD;  [facility];  Service: Endoscopy;  Laterality: N/A;  10:30   CORONARY ARTERY BYPASS GRAFT  1990   LUMBAR DISC SURGERY     TRANSURETHRAL RESECTION OF BLADDER TUMOR N/A 03/01/2017   Procedure: TRANSURETHRAL RESECTION OF BLADDER TUMOR (TURBT);  Surgeon: Franchot Gallo, MD;  Location: [facility];  Service: Urology;  Laterality: N/A;   TRANSURETHRAL RESECTION OF BLADDER TUMOR N/A 04/12/2017   Procedure: REPEAT TRANSURETHRAL RESECTION OF BLADDER TUMOR (TURBT);  Surgeon: Franchot Gallo, MD;  Location: [facility];  Service: Urology;  Laterality: N/A;  1 HR [phone] [place]    Home Medications:  Allergies as of 06/09/2021   No Known Allergies      Medication List        Accurate as of June 09, 2021  2:18 PM. If you have any questions, ask your nurse or doctor.          acetaminophen 500 MG tablet Commonly known as: TYLENOL Take 1,000 mg by mouth every 8 (eight) hours as needed for mild pain.   atorvastatin 80 MG tablet Commonly known as: LIPITOR TAKE 1 TABLET BY MOUTH ONCE DAILY IN THE EVENING   tamsulosin 0.4 MG Caps capsule Commonly known as: FLOMAX Take 0.4 mg by mouth daily after supper.   warfarin 5 MG tablet Commonly known as: COUMADIN Take as directed by the anticoagulation clinic. If you are unsure how to take this medication,  talk to your nurse or doctor. Original instructions: TAKE 1 & 1/2 (ONE & ONE-HALF) TABLETS BY MOUTH ONCE DAILY AT  6PM  OR  AS  DIRECTED  BY  COUMADIN  CLINIC        Allergies: No Known Allergies  Family History  Problem Relation Age of Onset   Heart attack Mother    Coronary artery disease Brother         CABG + pacemaker   Pneumonia Brother        infant death   Colon cancer Neg Hx    Colon polyps Neg Hx     Social History:  reports that he quit smoking about 38 years ago. His smoking use included cigarettes. He has a 37.50 pack-year smoking history. He has never used smokeless tobacco. He reports current alcohol use. He reports that he does not use drugs.  ROS: A complete review of systems was performed.  All systems are negative except for pertinent findings as noted.  Physical Exam:  Vital signs in last 24 hours: BP (!) 168/74   Pulse 69   Temp 98.2 F (36.8 C)   Ht 5\' 8"  (1.727 m)   Wt 166 lb 3.2 oz (75.4 kg)   BMI 25.27 kg/m  Constitutional:  Alert and oriented, No acute distress Cardiovascular: Regular rate  Respiratory: Normal respiratory effort Neurologic: Grossly intact, no focal deficits Psychiatric: Normal mood and affect  Cystoscopy Procedure Note:  Indication: Bladder cancer surveillance  After informed consent and discussion of the procedure and its risks, CORDELRO GAUTREAU was positioned and prepped in the standard fashion.  Cystoscopy was performed with a flexible cystoscope.   Findings: Urethra: No lesions/strictures Prostate: Obstructing, bilobar hypertrophy Bladder neck: No contracture Ureteral orifices: Normal Bladder: Multiple moderate to severe trabeculations.  No urothelial abnormalities.  The patient tolerated the procedure well.  Cipro administered     Impression/Assessment:  History of bladder cancer, no evident recurrence today on cystoscopy  Plan:  Bladder washings sent for cytology  I will see back in a year for cystoscopy

## 2021-06-13 LAB — CYTOLOGY, URINE

## 2021-06-15 ENCOUNTER — Telehealth: Payer: Self-pay

## 2021-06-15 NOTE — Telephone Encounter (Signed)
Called and lvm for pt call us back

## 2021-06-15 NOTE — Telephone Encounter (Signed)
-----   Message from Franchot Gallo, MD sent at 06/15/2021  9:42 AM EDT ----- Notify pt--cytology showed no specific cancer cells ----- Message ----- From: Mardelle Matte, CMA Sent: 06/15/2021   9:36 AM EDT To: Franchot Gallo, MD  Please review

## 2021-06-16 NOTE — Telephone Encounter (Signed)
Patient came by office this am- notified of results.

## 2021-07-13 ENCOUNTER — Ambulatory Visit (INDEPENDENT_AMBULATORY_CARE_PROVIDER_SITE_OTHER): Payer: Medicare HMO | Admitting: *Deleted

## 2021-07-13 DIAGNOSIS — I4892 Unspecified atrial flutter: Secondary | ICD-10-CM | POA: Diagnosis not present

## 2021-07-13 DIAGNOSIS — Z5181 Encounter for therapeutic drug level monitoring: Secondary | ICD-10-CM

## 2021-07-13 LAB — POCT INR: INR: 2.6 (ref 2.0–3.0)

## 2021-07-13 NOTE — Patient Instructions (Signed)
Continue warfarin 1 1/2 tablets daily.  Continue greens Recheck in 6 weeks

## 2021-08-15 ENCOUNTER — Other Ambulatory Visit: Payer: Self-pay | Admitting: Cardiology

## 2021-08-24 ENCOUNTER — Ambulatory Visit (INDEPENDENT_AMBULATORY_CARE_PROVIDER_SITE_OTHER): Payer: Medicare HMO | Admitting: *Deleted

## 2021-08-24 ENCOUNTER — Other Ambulatory Visit: Payer: Self-pay

## 2021-08-24 DIAGNOSIS — I4892 Unspecified atrial flutter: Secondary | ICD-10-CM | POA: Diagnosis not present

## 2021-08-24 DIAGNOSIS — Z5181 Encounter for therapeutic drug level monitoring: Secondary | ICD-10-CM | POA: Diagnosis not present

## 2021-08-24 LAB — POCT INR: INR: 2.3 (ref 2.0–3.0)

## 2021-08-24 NOTE — Patient Instructions (Signed)
Continue warfarin 1 1/2 tablets daily.  Continue greens Recheck in 6 weeks

## 2021-10-05 ENCOUNTER — Ambulatory Visit (INDEPENDENT_AMBULATORY_CARE_PROVIDER_SITE_OTHER): Payer: Medicare HMO | Admitting: *Deleted

## 2021-10-05 DIAGNOSIS — Z5181 Encounter for therapeutic drug level monitoring: Secondary | ICD-10-CM | POA: Diagnosis not present

## 2021-10-05 DIAGNOSIS — I4892 Unspecified atrial flutter: Secondary | ICD-10-CM | POA: Diagnosis not present

## 2021-10-05 LAB — POCT INR: INR: 2.6 (ref 2.0–3.0)

## 2021-10-05 NOTE — Patient Instructions (Signed)
Continue warfarin 1 1/2 tablets daily.  Continue greens Recheck in 6 weeks

## 2021-11-03 ENCOUNTER — Ambulatory Visit
Admission: EM | Admit: 2021-11-03 | Discharge: 2021-11-03 | Disposition: A | Discharge status: Home / Self Care | Payer: Medicare HMO | Attending: Student | Admitting: Student

## 2021-11-03 ENCOUNTER — Other Ambulatory Visit: Payer: Self-pay

## 2021-11-03 ENCOUNTER — Encounter: Payer: Self-pay | Admitting: Emergency Medicine

## 2021-11-03 DIAGNOSIS — Z20822 Contact with and (suspected) exposure to covid-19: Secondary | ICD-10-CM

## 2021-11-03 NOTE — ED Provider Notes (Signed)
[facility]    CSN: [id number] Arrival date & time: 11/03/21  1129      History   Chief Complaint No chief complaint on file.   HPI Shane [name] is a 88 y.o. male presenting following covid exposure at home. History former smoker. Currently asymptomatic. Denies fevers/chills, n/v/d, shortness of breath, chest pain, cough, congestion, facial pain, teeth pain, headaches, sore throat, loss of taste/smell, swollen lymph nodes, ear pain, sob, cp.     HPI  Past Medical History:  Diagnosis Date   Arteriosclerotic cardiovascular disease (ASCVD)    CABG surgery in 1990. MI in 1986; coronary angiography in 2003-critical LAD with patent LIMA; total obstruction of the RCA with patent RIMA; low normal EF.   Atrial flutter, paroxysmal ([place])    asymptomatic; onset in 2010; 4:1 AVB with low-dose metoprolol; moderate left      atrial enlargement and mild left ventricular hypertrophy with normal ejection fraction by echocardiography   Benign prostatic hypertrophy    Bradycardia    a. requiring cessation of beta blocker 12/2016.   Chronic anticoagulation    Hyperlipidemia    Lipid profile in 09/2010:196, 84, 54, 125   Mobitz type 1 second degree atrioventricular block 12/17/2016   Nephrolithiasis    Polymyalgia rheumatica [facility]     Patient Active Problem List   Diagnosis Date Noted   Primary cancer of urinary bladder neck ([place]) 03/01/2017   Mobitz type 1 second degree atrioventricular block 12/17/2016   1st degree AV block 12/17/2016   Symptomatic bradycardia 12/16/2016   Encounter for therapeutic drug monitoring 10/03/2013   Colon cancer screening 07/12/2013   Borderline hypertension 10/07/2011   Arteriosclerotic cardiovascular disease (ASCVD)    Hyperlipidemia    Chronic anticoagulation    Atrial flutter, paroxysmal ([place])    Nephrolithiasis 03/26/2009   Polymyalgia rheumatica ([place]) 03/26/2009   BPH (benign prostatic hyperplasia) 03/26/2009    Past Surgical History:   Procedure Laterality Date   COLONOSCOPY  08/21/01   RMR: Left-sided diverticulum.  Remainder of colonic mucosa appeared normal   COLONOSCOPY N/A 07/31/2013   Procedure: COLONOSCOPY;  Surgeon: Danie Binder, MD;  [facility];  Service: Endoscopy;  Laterality: N/A;  10:30   CORONARY ARTERY BYPASS GRAFT  1990   LUMBAR DISC SURGERY     TRANSURETHRAL RESECTION OF BLADDER TUMOR N/A 03/01/2017   Procedure: TRANSURETHRAL RESECTION OF BLADDER TUMOR (TURBT);  Surgeon: Franchot Gallo, MD;  Location: [facility];  Service: Urology;  Laterality: N/A;   TRANSURETHRAL RESECTION OF BLADDER TUMOR N/A 04/12/2017   Procedure: REPEAT TRANSURETHRAL RESECTION OF BLADDER TUMOR (TURBT);  Surgeon: Franchot Gallo, MD;  Location: [facility];  Service: Urology;  Laterality: N/A;  1 HR [phone] [place] Medications    Prior to Admission medications   Medication Sig Start Date End Date Taking? Authorizing Provider  acetaminophen (TYLENOL) 500 MG tablet Take 1,000 mg by mouth every 8 (eight) hours as needed for mild pain.    [provider]  atorvastatin (LIPITOR) 80 MG tablet TAKE 1 TABLET BY MOUTH ONCE DAILY IN THE EVENING 02/03/21   Satira Sark, MD  tamsulosin (FLOMAX) 0.4 MG CAPS capsule Take 0.4 mg by mouth daily after supper.    [provider]  warfarin (COUMADIN) 5 MG tablet TAKE 1 & 1/2 (ONE & ONE-HALF) TABLETS BY MOUTH ONCE DAILY AT  6:00  PM  OR  AS  DIRECTED  BY  COUMADIN  CLINIC 08/17/21  Arnoldo Lenis, MD    Family History Family History  Problem Relation Age of Onset   Heart attack Mother    Coronary artery disease Brother        CABG + pacemaker   Pneumonia Brother        infant death   Colon cancer Neg Hx    Colon polyps Neg Hx     Social History Social History   Tobacco Use   Smoking status: Former    Packs/day: 1.50    Years: 25.00    Pack years: 37.50    Types: Cigarettes    Quit date: 10/07/1982    Years  since quitting: 39.1   Smokeless tobacco: Never  Vaping Use   Vaping Use: Never used  Substance Use Topics   Alcohol use: Yes    Comment: 1 bottle of wine weekly; occasional beer   Drug use: No     Allergies   Patient has no known allergies.   Review of Systems Review of Systems  Constitutional:  Negative for appetite change, chills and fever.  HENT:  Negative for congestion, ear pain, rhinorrhea, sinus pressure, sinus pain and sore throat.   Eyes:  Negative for redness and visual disturbance.  Respiratory:  Negative for cough, chest tightness, shortness of breath and wheezing.   Cardiovascular:  Negative for chest pain and palpitations.  Gastrointestinal:  Negative for abdominal pain, constipation, diarrhea, nausea and vomiting.  Genitourinary:  Negative for dysuria, frequency and urgency.  Musculoskeletal:  Negative for myalgias.  Neurological:  Negative for dizziness, weakness and headaches.  Psychiatric/Behavioral:  Negative for confusion.   All other systems reviewed and are negative.   Physical Exam Triage Vital Signs ED Triage Vitals  Enc Vitals Group     BP 11/03/21 1135 (!) 154/76     Pulse Rate 11/03/21 1135 69     Resp 11/03/21 1135 18     Temp 11/03/21 1135 98.1 F (36.7 C)     Temp Source 11/03/21 1135 Oral     SpO2 11/03/21 1135 94 %     Weight --      Height --      Head Circumference --      Peak Flow --      Pain Score 11/03/21 1136 0     Pain Loc --      Pain Edu? --      Excl. in [place]? --    No data found.  Updated Vital Signs BP (!) 154/76 (BP Location: Right Arm)    Pulse 69    Temp 98.1 F (36.7 C) (Oral)    Resp 18    SpO2 94%   Visual Acuity Right Eye Distance:   Left Eye Distance:   Bilateral Distance:    Right Eye Near:   Left Eye Near:    Bilateral Near:     Physical Exam Vitals reviewed.  Constitutional:      General: He is not in acute distress.    Appearance: Normal appearance. He is not ill-appearing.  HENT:     Head:  Normocephalic and atraumatic.     Right Ear: Tympanic membrane, ear canal and external ear normal. No tenderness. No middle ear effusion. There is no impacted cerumen. Tympanic membrane is not perforated, erythematous, retracted or bulging.     Left Ear: Tympanic membrane, ear canal and external ear normal. No tenderness.  No middle ear effusion. There is no impacted cerumen. Tympanic membrane is not perforated, erythematous,  retracted or bulging.     Nose: Nose normal. No congestion.     Mouth/Throat:     Mouth: Mucous membranes are moist.     Pharynx: Uvula midline. No oropharyngeal exudate or posterior oropharyngeal erythema.  Eyes:     Extraocular Movements: Extraocular movements intact.     Pupils: Pupils are equal, round, and reactive to light.  Cardiovascular:     Rate and Rhythm: Normal rate and regular rhythm.     Heart sounds: Normal heart sounds.  Pulmonary:     Effort: Pulmonary effort is normal.     Breath sounds: Normal breath sounds. No decreased breath sounds, wheezing, rhonchi or rales.  Abdominal:     Palpations: Abdomen is soft.     Tenderness: There is no abdominal tenderness. There is no guarding or rebound.  Lymphadenopathy:     Cervical: No cervical adenopathy.     Right cervical: No superficial cervical adenopathy.    Left cervical: No superficial cervical adenopathy.  Neurological:     General: No focal deficit present.     Mental Status: He is alert and oriented to person, place, and time.  Psychiatric:        Mood and Affect: Mood normal.        Behavior: Behavior normal.        Thought Content: Thought content normal.        Judgment: Judgment normal.     UC Treatments / Results  Labs (all labs ordered are listed, but only abnormal results are displayed) Labs Reviewed  NOVEL CORONAVIRUS, NAA    EKG   Radiology No results found.  Procedures Procedures (including critical care time)  Medications Ordered in UC Medications - No data to  display  Initial Impression / Assessment and Plan / UC Course  I have reviewed the triage vital signs and the nursing notes.  Pertinent labs & imaging results that were available during my care of the patient were reviewed by me and considered in my medical decision making (see chart for details).     This patient is a very pleasant 88 y.o. year old male presenting with exposure to covid - asymptomatic. Today this pt is afebrile nontachycardic nontachypneic, oxygenating well on room air, no wheezes rhonchi or rales. Former smoker but no dx of pulm ds. Clinically well appearing today. Covid PCR sent. If positive he would be a candidate for Molnupiravir.    Final Clinical Impressions(s) / UC Diagnoses   Final diagnoses:  Exposure to COVID-19 virus     Discharge Instructions      -We'll call in 2-3 days with any positive lab results.     ED Prescriptions   None    PDMP not reviewed this encounter.   Hazel Sams, PA-C 11/03/21 1150

## 2021-11-03 NOTE — ED Triage Notes (Signed)
Exposure to covid from wife.  States no symptoms

## 2021-11-03 NOTE — Discharge Instructions (Addendum)
-  We'll call in 2-3 days with any positive lab results.

## 2021-11-04 ENCOUNTER — Telehealth (HOSPITAL_COMMUNITY): Payer: Self-pay | Admitting: Emergency Medicine

## 2021-11-04 LAB — NOVEL CORONAVIRUS, NAA: SARS-CoV-2, NAA: DETECTED — AB

## 2021-11-04 MED ORDER — MOLNUPIRAVIR EUA 200MG CAPSULE: 4.0000 | ORAL_CAPSULE | Freq: Two times a day (BID) | ORAL | 0 refills | Status: AC

## 2021-11-17 ENCOUNTER — Ambulatory Visit (INDEPENDENT_AMBULATORY_CARE_PROVIDER_SITE_OTHER): Payer: Medicare HMO | Admitting: *Deleted

## 2021-11-17 ENCOUNTER — Other Ambulatory Visit: Payer: Self-pay

## 2021-11-17 DIAGNOSIS — I4892 Unspecified atrial flutter: Secondary | ICD-10-CM

## 2021-11-17 DIAGNOSIS — Z5181 Encounter for therapeutic drug level monitoring: Secondary | ICD-10-CM

## 2021-11-17 LAB — POCT INR: INR: 3.2 — AB (ref 2.0–3.0)

## 2021-11-17 NOTE — Patient Instructions (Signed)
Hold warfarin tonight then resume 1 1/2 tablets daily.  ?Continue greens ?Recheck in 6 weeks  ?

## 2021-12-29 ENCOUNTER — Ambulatory Visit (INDEPENDENT_AMBULATORY_CARE_PROVIDER_SITE_OTHER): Payer: Medicare HMO | Admitting: *Deleted

## 2021-12-29 DIAGNOSIS — Z5181 Encounter for therapeutic drug level monitoring: Secondary | ICD-10-CM

## 2021-12-29 DIAGNOSIS — I4892 Unspecified atrial flutter: Secondary | ICD-10-CM | POA: Diagnosis not present

## 2021-12-29 LAB — POCT INR: INR: 4.1 — AB (ref 2.0–3.0)

## 2021-12-29 NOTE — Patient Instructions (Signed)
Hold warfarin tonight then decrease dose to 1 1/2 tablets daily except 1 tablet on Mondays and Thursdays.  ?Continue greens ?Recheck in 3 weeks  ?

## 2022-01-20 ENCOUNTER — Ambulatory Visit (INDEPENDENT_AMBULATORY_CARE_PROVIDER_SITE_OTHER): Payer: Medicare HMO | Admitting: *Deleted

## 2022-01-20 DIAGNOSIS — I4892 Unspecified atrial flutter: Secondary | ICD-10-CM

## 2022-01-20 DIAGNOSIS — Z5181 Encounter for therapeutic drug level monitoring: Secondary | ICD-10-CM | POA: Diagnosis not present

## 2022-01-20 LAB — POCT INR: INR: 3 (ref 2.0–3.0)

## 2022-01-20 NOTE — Patient Instructions (Signed)
Continue warfarin 1 1/2 tablets daily except 1 tablet on Mondays and Thursdays.  ?Continue greens ?Recheck in 4 weeks  ?

## 2022-01-22 ENCOUNTER — Other Ambulatory Visit: Payer: Self-pay | Admitting: Cardiology

## 2022-02-04 ENCOUNTER — Other Ambulatory Visit: Payer: Self-pay | Admitting: Cardiology

## 2022-02-17 ENCOUNTER — Ambulatory Visit (INDEPENDENT_AMBULATORY_CARE_PROVIDER_SITE_OTHER): Payer: Medicare HMO | Admitting: *Deleted

## 2022-02-17 DIAGNOSIS — I4892 Unspecified atrial flutter: Secondary | ICD-10-CM

## 2022-02-17 DIAGNOSIS — Z5181 Encounter for therapeutic drug level monitoring: Secondary | ICD-10-CM | POA: Diagnosis not present

## 2022-02-17 LAB — POCT INR: INR: 2.4 (ref 2.0–3.0)

## 2022-02-17 NOTE — Patient Instructions (Signed)
Continue warfarin 1 1/2 tablets daily except 1 tablet on Mondays and Thursdays.  Continue greens Recheck in 5 weeks

## 2022-02-20 ENCOUNTER — Other Ambulatory Visit: Payer: Self-pay | Admitting: Cardiology

## 2022-02-22 ENCOUNTER — Other Ambulatory Visit: Payer: Self-pay | Admitting: *Deleted

## 2022-02-22 ENCOUNTER — Telehealth: Payer: Self-pay | Admitting: Internal Medicine

## 2022-02-22 MED ORDER — WARFARIN SODIUM 5 MG PO TABS: ORAL_TABLET | ORAL | 6 refills | Status: DC

## 2022-02-22 NOTE — Telephone Encounter (Addendum)
Pt wife walked in and states that pt needs refill. She states that patient is out and needs refill today.

## 2022-02-22 NOTE — Telephone Encounter (Signed)
Warfarin refill sent; this is a duplicate, will not refill.

## 2022-02-22 NOTE — Telephone Encounter (Signed)
error 

## 2022-03-24 ENCOUNTER — Ambulatory Visit (INDEPENDENT_AMBULATORY_CARE_PROVIDER_SITE_OTHER): Payer: Medicare HMO | Admitting: *Deleted

## 2022-03-24 DIAGNOSIS — I4892 Unspecified atrial flutter: Secondary | ICD-10-CM

## 2022-03-24 DIAGNOSIS — Z5181 Encounter for therapeutic drug level monitoring: Secondary | ICD-10-CM | POA: Diagnosis not present

## 2022-03-24 LAB — POCT INR: INR: 4 — AB (ref 2.0–3.0)

## 2022-03-24 NOTE — Patient Instructions (Signed)
Hold warfarin tonight then resume 1 1/2 tablets daily except 1 tablet on Mondays and Thursdays.  Continue greens Recheck in 3 weeks

## 2022-03-31 ENCOUNTER — Other Ambulatory Visit: Payer: Self-pay | Admitting: Cardiology

## 2022-04-04 ENCOUNTER — Other Ambulatory Visit: Payer: Self-pay | Admitting: Cardiology

## 2022-04-15 ENCOUNTER — Ambulatory Visit (INDEPENDENT_AMBULATORY_CARE_PROVIDER_SITE_OTHER): Payer: Medicare HMO | Admitting: *Deleted

## 2022-04-15 DIAGNOSIS — I4892 Unspecified atrial flutter: Secondary | ICD-10-CM | POA: Diagnosis not present

## 2022-04-15 DIAGNOSIS — Z5181 Encounter for therapeutic drug level monitoring: Secondary | ICD-10-CM

## 2022-04-15 LAB — POCT INR: INR: 4.2 — AB (ref 2.0–3.0)

## 2022-04-15 NOTE — Patient Instructions (Signed)
Hold warfarin tonight then decrease dose to 1 tablet daily except 1 1/2 tablets on Sundays, Tuesdays and Thursdays.  Continue greens Recheck in 3 weeks

## 2022-05-01 ENCOUNTER — Other Ambulatory Visit: Payer: Self-pay | Admitting: Cardiology

## 2022-05-06 ENCOUNTER — Ambulatory Visit: Payer: Medicare HMO | Attending: Cardiology | Admitting: *Deleted

## 2022-05-06 ENCOUNTER — Telehealth: Payer: Self-pay | Admitting: Cardiology

## 2022-05-06 DIAGNOSIS — I4892 Unspecified atrial flutter: Secondary | ICD-10-CM

## 2022-05-06 DIAGNOSIS — Z5181 Encounter for therapeutic drug level monitoring: Secondary | ICD-10-CM | POA: Diagnosis not present

## 2022-05-06 LAB — POCT INR: INR: 4.4 — AB (ref 2.0–3.0)

## 2022-05-06 MED ORDER — ATORVASTATIN CALCIUM 80 MG PO TABS: 80.0000 mg | ORAL_TABLET | Freq: Every day | ORAL | 0 refills | Status: DC

## 2022-05-06 NOTE — Patient Instructions (Signed)
Hold warfarin tonight then decrease dose to 1 tablet daily Continue greens Recheck in 3 weeks

## 2022-05-06 NOTE — Telephone Encounter (Signed)
Please send refills on atorvastatin (LIPITOR) 80 MG tablet [[id number]]   Has apt in Nov

## 2022-05-21 ENCOUNTER — Telehealth: Payer: Self-pay | Admitting: Cardiology

## 2022-05-21 NOTE — Telephone Encounter (Signed)
Pharmacy received okay from Pharm D.

## 2022-05-21 NOTE — Telephone Encounter (Signed)
Pt c/o medication issue:  1. Name of Medication: warfarin (COUMADIN) 5 MG tablet  2. How are you currently taking this medication (dosage and times per day)?    3. Are you having a reaction (difficulty breathing--STAT)? no  4. What is your medication issue? they are calling in to see if they can do another generic for this medicaion warfarin (COUMADIN) 5 MG tablet. because the manufactory they were using are unable to get the medication through.   (Megan from [facility] said it was okay)

## 2022-05-27 ENCOUNTER — Ambulatory Visit: Payer: Medicare HMO | Attending: Cardiology | Admitting: *Deleted

## 2022-05-27 DIAGNOSIS — I4892 Unspecified atrial flutter: Secondary | ICD-10-CM

## 2022-05-27 DIAGNOSIS — Z5181 Encounter for therapeutic drug level monitoring: Secondary | ICD-10-CM | POA: Diagnosis not present

## 2022-05-27 LAB — POCT INR: INR: 1.6 — AB (ref 2.0–3.0)

## 2022-05-27 NOTE — Patient Instructions (Signed)
Description    Take 1.5 tablets of warfarin today and tomorrow.   Then START taking warfarin 1 tablet daily except for 1.5 tablets on Thursdays. Recheck INR in 2 weeks.

## 2022-06-10 ENCOUNTER — Encounter: Payer: Self-pay | Admitting: Nurse Practitioner

## 2022-06-10 ENCOUNTER — Ambulatory Visit: Payer: Medicare HMO | Attending: Cardiology | Admitting: *Deleted

## 2022-06-10 DIAGNOSIS — Z5181 Encounter for therapeutic drug level monitoring: Secondary | ICD-10-CM

## 2022-06-10 DIAGNOSIS — I4892 Unspecified atrial flutter: Secondary | ICD-10-CM | POA: Diagnosis not present

## 2022-06-10 LAB — POCT INR: INR: 2 (ref 2.0–3.0)

## 2022-06-10 NOTE — Patient Instructions (Signed)
Increase warfarin to 1 tablet daily except for 1.5 tablets on Mondays and Thursdays. Recheck INR in 4 weeks.

## 2022-07-12 ENCOUNTER — Ambulatory Visit: Payer: Medicare HMO | Attending: Cardiology | Admitting: *Deleted

## 2022-07-12 DIAGNOSIS — Z5181 Encounter for therapeutic drug level monitoring: Secondary | ICD-10-CM

## 2022-07-12 DIAGNOSIS — I4892 Unspecified atrial flutter: Secondary | ICD-10-CM

## 2022-07-12 LAB — POCT INR: INR: 2 (ref 2.0–3.0)

## 2022-07-12 NOTE — Patient Instructions (Signed)
Continue warfarin 1 tablet daily except for 1.5 tablets on Mondays and Thursdays. Recheck INR in 2.5 weeks.

## 2022-07-25 NOTE — Progress Notes (Unsigned)
Cardiology Office Note:    Date:  07/27/2022   ID:  Shane [name], DOB [birth year], MRN [id number]  PCP:  Asencion Noble, [place] [facility] Cardiologist:  Carlyle Dolly, MD     Referring MD: Asencion Noble, MD   CC: Here for overdue 1 year follow-up  History of Present Illness:    Shane [name] is a 88 y.o. male with a hx of the following:  CAD, s/p CABG in 1990, MI in 1996 Paroxysmal atrial flutter (chronic AC) First-degree AV block, Mobitz type I second-degree AV block BPH Hyperlipidemia Symptomatic bradycardia  Previous cardiovascular surgery of CABG in 1990, previously had an MI in 1996, and underwent coronary angiography in 2003 that found critical LAD with patent LIMA, total obstruction of the RCA with patent RIMA, EF was low normal.  Onset of paroxysmal atrial flutter in 2010 (asymptomatic).  2010 echo revealed EF 55 to 60%, no RWMA, moderate basal septal hypertrophy noted, mildly calcified aortic valve, mild fibrocalcific change of aortic root, mild to moderately dilated right atrium, mild RVH, moderately dilated left atrium, volume of MR by proximal ICA velocities surface area was 19 cc. Previously unable to tolerate beta-blocker as this caused bradycardia for him in 2018.  Last seen by Dr. Carlyle Dolly on November 25, 2020 and was overall doing very well.  Was compliant with his medications and was walking regularly, several miles without any difficulty.  He was compliant with his medication and remained on chronic Coumadin.   Today he presents for overdue 1 year follow-up.  He states he is doing very well and feels great.  He denies any cardiac complaints or concerns.  He denies any chest pain, shortness of breath, palpitations, syncope, presyncope, dizziness, orthopnea, PND, swelling, significant weight changes, acute bleeding, or claudication.  He stays very active and walks 3 miles per day.  Was evaluated at the Coumadin clinic before visit, tolerating  medication well.  Denies any bleeding issues.  He is requesting a refill of Coumadin.   Denies any other questions or concerns.  Past Medical History:  Diagnosis Date   Arteriosclerotic cardiovascular disease (ASCVD)    CABG surgery in 1990. MI in 1986; coronary angiography in 2003-critical LAD with patent LIMA; total obstruction of the RCA with patent RIMA; low normal EF.   Atrial flutter, paroxysmal ([place])    asymptomatic; onset in 2010; 4:1 AVB with low-dose metoprolol; moderate left      atrial enlargement and mild left ventricular hypertrophy with normal ejection fraction by echocardiography   Benign prostatic hypertrophy    Bifascicular block    EKG 07/27/22: SB with SA, first-degree AV block, RBBB, left posterior fascicular block (bifascicular block) .   Bradycardia    a. requiring cessation of beta blocker 12/2016.   Chronic anticoagulation    Current use of long term anticoagulation    Remains on chronic Coumadin   Hyperlipidemia    Lipid profile in 09/2010:196, 84, 54, 125   Mobitz type 1 second degree atrioventricular block 12/17/2016   Nephrolithiasis    Polymyalgia rheumatica ([place])     Past Surgical History:  Procedure Laterality Date   COLONOSCOPY  08/21/01   RMR: Left-sided diverticulum.  Remainder of colonic mucosa appeared normal   COLONOSCOPY N/A 07/31/2013   Procedure: COLONOSCOPY;  Surgeon: Danie Binder, MD;  [facility];  Service: Endoscopy;  Laterality: N/A;  10:30   CORONARY ARTERY BYPASS GRAFT  1990   LUMBAR DISC SURGERY  TRANSURETHRAL RESECTION OF BLADDER TUMOR N/A 03/01/2017   Procedure: TRANSURETHRAL RESECTION OF BLADDER TUMOR (TURBT);  Surgeon: Franchot Gallo, MD;  Location: [facility];  Service: Urology;  Laterality: N/A;   TRANSURETHRAL RESECTION OF BLADDER TUMOR N/A 04/12/2017   Procedure: REPEAT TRANSURETHRAL RESECTION OF BLADDER TUMOR (TURBT);  Surgeon: Franchot Gallo, MD;  Location: [facility];  Service: Urology;  Laterality: N/A;  1  HR [phone] [employer] [id number]    Current Medications: Current Meds  Medication Sig   acetaminophen (TYLENOL) 500 MG tablet Take 1,000 mg by mouth every 8 (eight) hours as needed for mild pain.   atorvastatin (LIPITOR) 80 MG tablet Take 1 tablet (80 mg total) by mouth daily.   tamsulosin (FLOMAX) 0.4 MG CAPS capsule Take 0.4 mg by mouth daily after supper.   warfarin (COUMADIN) 5 MG tablet TAKE 1 & 1/2 (ONE & ONE-HALF) TABLETS BY MOUTH ONCE DAILY AT  6:00  PM  OR  AS  DIRECTED  BY  COUMADIN  CLINIC     Allergies:   Patient has no known allergies.   Social History   Socioeconomic History   Marital status: Married    Spouse name: Not on file   Number of children: 3   Years of education: Not on file   Highest education level: Not on file  Occupational History   Occupation: Retail  Tobacco Use   Smoking status: Former    Packs/day: 1.50    Years: 25.00    Total pack years: 37.50    Types: Cigarettes    Quit date: 10/07/1982    Years since quitting: 39.8    Passive exposure: Never   Smokeless tobacco: Never  Vaping Use   Vaping Use: Never used  Substance and Sexual Activity   Alcohol use: Yes    Comment: 1 bottle of wine weekly; occasional beer   Drug use: No   Sexual activity: Not on file  Other Topics Concern   Not on file  Social History Narrative   Not on file   Social Determinants of Health   Financial Resource Strain: Not on file  Food Insecurity: Not on file  Transportation Needs: Not on file  Physical Activity: Not on file  Stress: Not on file  Social Connections: Not on file     Family History: The patient's family history includes Coronary artery disease in his brother; Heart attack in his mother; Pneumonia in his brother. There is no history of Colon cancer or Colon polyps.  ROS:   Review of Systems  Constitutional: Negative.   HENT:  Positive for hearing loss. Negative for congestion, ear discharge, ear pain, nosebleeds, sinus pain, sore  throat and tinnitus.   Eyes: Negative.   Respiratory: Negative.  Negative for stridor.   Cardiovascular: Negative.   Gastrointestinal: Negative.   Genitourinary: Negative.   Musculoskeletal: Negative.   Skin: Negative.   Neurological: Negative.   Endo/Heme/Allergies: Negative.   Psychiatric/Behavioral: Negative.      Please see the history of present illness.    All other systems reviewed and are negative.  EKGs/Labs/Other Studies Reviewed:    The following studies were reviewed today:   EKG:  EKG is ordered today and demonstrates sinus bradycardia, 50 bpm, with marked sinus arrhythmia with first-degree AV block, right bundle branch block, and left posterior fascicular block (bifascicular block), otherwise no acute ischemic changes.  30-day event monitor on June 09, 2017: Tracings show sinus rhythm. Occasional second degree type I Desiree Hane) block and 2:1 AV block in  early AM hours presumably while sleeping, at these times heart rates in 30s No symptoms reported  Myoview on Feb 03, 2005:  This is an abnormal perfusion scan in a patient with known coronary artery disease. There is distinct fixed defect consistent with an old myocardial infarction; however, without any significant ischemia. Excellent exercise tolerance with appropriate blood pressure response and no ischemic ST-T wave changes. This is a low risk scan.    Recent Labs: No results found for requested labs within last 365 days.  Recent Lipid Panel    Component Value Date/Time   CHOL 124 12/25/2011 0800   TRIG 40 12/25/2011 0800   TRIG 84 09/21/2010 0000   HDL 49 12/25/2011 0800   CHOLHDL 2.5 12/25/2011 0800   VLDL 8 12/25/2011 0800   LDLCALC 67 12/25/2011 0800   LDLCALC 125 09/21/2010 0000     Risk Assessment/Calculations:    CHA2DS2-VASc Score = 3  This indicates a 3.2% annual risk of stroke. The patient's score is based upon: CHF History: 0 HTN History: 0 Diabetes History: 0 Stroke History:  0 Vascular Disease History: 1 Age Score: 2 Gender Score: 0     Physical Exam:    VS:  BP 132/82 (BP Location: Right Arm, Patient Position: Sitting, Cuff Size: Normal)   Pulse 66   Ht '5\' 8"'$  (1.727 m)   Wt 166 lb 9.6 oz (75.6 kg)   SpO2 97%   BMI 25.33 kg/m     Wt Readings from Last 3 Encounters:  07/27/22 166 lb 9.6 oz (75.6 kg)  06/09/21 166 lb 3.2 oz (75.4 kg)  11/25/20 169 lb (76.7 kg)     GEN: Well nourished, well developed in no acute distress HEENT: Normal NECK: No JVD; No carotid bruits CARDIAC: S1/S2, slow rate and overall regular rhythm, no murmurs, rubs, gallops; 2+ peripheral pulses throughout, strong and equal bilaterally RESPIRATORY:  Clear and diminished to auscultation without rales, wheezing or rhonchi  MUSCULOSKELETAL:  No edema; No deformity  SKIN: Thin skin, warm and dry NEUROLOGIC:  Alert and oriented x 3 PSYCHIATRIC:  Normal, pleasant affect   ASSESSMENT:    1. Coronary artery disease involving native coronary artery of native heart without angina pectoris   2. Atrial flutter, paroxysmal (HCC)   3. Current use of long term anticoagulation   4. Hyperlipidemia, unspecified hyperlipidemia type   5. Sinus bradycardia   6. First degree AV block   7. 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 Bifascicular block    PLAN:    In order of problems listed above:  CAD, s/p CABG in 1996 and MI in 1996 Stable with no anginal symptoms. No indication for ischemic evaluation.  Continue Lipitor.  Currently not on beta-blocker due to history of bradycardia. Heart healthy diet and regular cardiovascular exercise encouraged.   Paroxysmal a flutter, long-term anticoagulation Denies any tachycardia or palpitations.  Onset was in 2010 and was asymptomatic at the time.  Currently in sinus bradycardia with underlying sinus arrhythmia as well on EKG.  Not on any beta-blocker or heart rate lowering therapy due to heart rate at 50 bpm.  He is  asymptomatic with this.  CHA2DS2-VASc score is 3.  Continue to follow-up with Coumadin clinic and adjust dose as instructed.  Denies any bleeding issues while on Coumadin.  Continue Coumadin and we will refill this medication today. Heart healthy diet and regular cardiovascular exercise encouraged.   Hyperlipidemia Labs from March 2023 revealed total cholesterol  145, HDL 65, LDL 68, and triglycerides 59.  His labs are at goal.  Continue Lipitor 80 mg daily. Heart healthy diet and regular cardiovascular exercise encouraged.    Sinus bradycardia, first degree AV block, RBBB, left posterior fascicular block (bifascicular block) Longstanding history of sinus bradycardia in the past.  Currently not on any AV nodal blocking agents to lower heart rate.  Also has had past history of right bundle branch block, first degree AV block; however I do not see history of left posterior fascicular block or bifascicular block, these findings are noted on twelve-lead EKG today.  He is asymptomatic with this and defers any further workup including 2D echocardiogram to evaluate for any wall motion abnormalities or cardiac monitor.  He has seen EP in the past.  Patient politely declines any further workup or consultation at this time.  He is requesting to monitor this for now and we will reevaluate at next office visit.  If he becomes symptomatic at future office visit, plan to arrange 14-day monitor, check electrolytes, and/or arrange follow-up with EP. ED precautions discussed.  Disposition: Follow-up with me or Dr. Carlyle Dolly in 6 months or sooner if anything changes.   Medication Adjustments/Labs and Tests Ordered: Current medicines are reviewed at length with the patient today.  Concerns regarding medicines are outlined above.  Orders Placed This Encounter  Procedures   EKG 12-Lead   No orders of the defined types were placed in this encounter.   Patient Instructions  Medication Instructions:  Your  physician recommends that you continue on your current medications as directed. Please refer to the Current Medication list given to you today.  Labwork: none  Testing/Procedures: none  Follow-Up: Your physician recommends that you schedule a follow-up appointment in: 6 months  Any Other Special Instructions Will Be Listed Below (If Applicable).  If you need a refill on your cardiac medications before your next appointment, please call your pharmacy.   SignedFinis Bud, NP  07/27/2022 12:10 PM    [facility]

## 2022-07-27 ENCOUNTER — Ambulatory Visit: Payer: Medicare HMO | Attending: Nurse Practitioner | Admitting: Nurse Practitioner

## 2022-07-27 ENCOUNTER — Encounter: Payer: Self-pay | Admitting: Nurse Practitioner

## 2022-07-27 ENCOUNTER — Ambulatory Visit (INDEPENDENT_AMBULATORY_CARE_PROVIDER_SITE_OTHER): Payer: Medicare HMO | Admitting: *Deleted

## 2022-07-27 VITALS — BP 132/82 | HR 66 | Ht 68.0 in | Wt 166.6 lb

## 2022-07-27 DIAGNOSIS — R001 Bradycardia, unspecified: Secondary | ICD-10-CM

## 2022-07-27 DIAGNOSIS — Z7901 Long term (current) use of anticoagulants: Secondary | ICD-10-CM | POA: Diagnosis not present

## 2022-07-27 DIAGNOSIS — I452 Bifascicular block: Secondary | ICD-10-CM

## 2022-07-27 DIAGNOSIS — I4892 Unspecified atrial flutter: Secondary | ICD-10-CM

## 2022-07-27 DIAGNOSIS — E785 Hyperlipidemia, unspecified: Secondary | ICD-10-CM | POA: Diagnosis not present

## 2022-07-27 DIAGNOSIS — I2510 Atherosclerotic heart disease of native coronary artery without angina pectoris: Secondary | ICD-10-CM

## 2022-07-27 DIAGNOSIS — I445 Left posterior fascicular block: Secondary | ICD-10-CM | POA: Insufficient documentation

## 2022-07-27 DIAGNOSIS — Z5181 Encounter for therapeutic drug level monitoring: Secondary | ICD-10-CM | POA: Diagnosis not present

## 2022-07-27 DIAGNOSIS — I440 Atrioventricular block, first degree: Secondary | ICD-10-CM

## 2022-07-27 LAB — POCT INR: INR: 1.3 — AB (ref 2.0–3.0)

## 2022-07-27 MED ORDER — WARFARIN SODIUM 5 MG PO TABS: ORAL_TABLET | ORAL | 5 refills | Status: DC

## 2022-07-27 NOTE — Patient Instructions (Addendum)

## 2022-07-27 NOTE — Patient Instructions (Signed)
Take warfarin 2 tablets tonight then increase dose to 1.5 tablets daily except for 1 tablet on Sundays, Tuesdays and Thursdays.  Recheck INR in 2 weeks.

## 2022-07-28 ENCOUNTER — Ambulatory Visit: Payer: Medicare HMO | Admitting: Nurse Practitioner

## 2022-08-09 ENCOUNTER — Other Ambulatory Visit: Payer: Self-pay | Admitting: Cardiology

## 2022-08-11 ENCOUNTER — Ambulatory Visit: Payer: Medicare HMO | Attending: Cardiology | Admitting: *Deleted

## 2022-08-11 DIAGNOSIS — I4892 Unspecified atrial flutter: Secondary | ICD-10-CM | POA: Diagnosis not present

## 2022-08-11 DIAGNOSIS — Z5181 Encounter for therapeutic drug level monitoring: Secondary | ICD-10-CM

## 2022-08-11 LAB — POCT INR: INR: 1.7 — AB (ref 2.0–3.0)

## 2022-08-11 NOTE — Patient Instructions (Signed)
Increase warfarin to 1 1/2 tablets daily.  Recheck INR in 2 weeks.

## 2022-08-26 ENCOUNTER — Ambulatory Visit: Payer: Medicare HMO | Attending: Cardiology | Admitting: *Deleted

## 2022-08-26 DIAGNOSIS — I4892 Unspecified atrial flutter: Secondary | ICD-10-CM

## 2022-08-26 DIAGNOSIS — Z5181 Encounter for therapeutic drug level monitoring: Secondary | ICD-10-CM

## 2022-08-26 LAB — POCT INR: POC INR: 2.4

## 2022-08-26 NOTE — Patient Instructions (Signed)
Description   Continue taking warfarin to 1 1/2 tablets daily.  Recheck INR in 3 weeks.

## 2022-09-16 ENCOUNTER — Ambulatory Visit: Payer: Medicare HMO | Attending: Cardiology | Admitting: *Deleted

## 2022-09-16 DIAGNOSIS — Z5181 Encounter for therapeutic drug level monitoring: Secondary | ICD-10-CM

## 2022-09-16 DIAGNOSIS — I4892 Unspecified atrial flutter: Secondary | ICD-10-CM | POA: Diagnosis not present

## 2022-09-16 LAB — POCT INR: INR: 2.3 (ref 2.0–3.0)

## 2022-09-16 NOTE — Patient Instructions (Signed)
Continue taking warfarin 1 1/2 tablets daily.  Recheck INR in 4 weeks.

## 2022-10-14 ENCOUNTER — Ambulatory Visit: Payer: Medicare HMO | Attending: Cardiology | Admitting: *Deleted

## 2022-10-14 DIAGNOSIS — I4892 Unspecified atrial flutter: Secondary | ICD-10-CM

## 2022-10-14 DIAGNOSIS — Z5181 Encounter for therapeutic drug level monitoring: Secondary | ICD-10-CM | POA: Diagnosis not present

## 2022-10-14 LAB — POCT INR: INR: 3 (ref 2.0–3.0)

## 2022-10-14 NOTE — Patient Instructions (Signed)
Continue taking warfarin 1 1/2 tablets daily.  Recheck INR in 6 weeks.

## 2022-11-21 ENCOUNTER — Ambulatory Visit (INDEPENDENT_AMBULATORY_CARE_PROVIDER_SITE_OTHER): Payer: Medicare HMO

## 2022-11-21 ENCOUNTER — Ambulatory Visit
Admission: EM | Admit: 2022-11-21 | Discharge: 2022-11-21 | Disposition: A | Discharge status: Home / Self Care | Payer: Medicare HMO | Attending: Nurse Practitioner | Admitting: Nurse Practitioner

## 2022-11-21 ENCOUNTER — Encounter: Payer: Self-pay | Admitting: Emergency Medicine

## 2022-11-21 DIAGNOSIS — M25561 Pain in right knee: Secondary | ICD-10-CM | POA: Diagnosis not present

## 2022-11-21 MED ORDER — DICLOFENAC SODIUM 1 % EX GEL: 4.0000 g | Freq: Four times a day (QID) | CUTANEOUS | 0 refills | Status: DC

## 2022-11-21 NOTE — Discharge Instructions (Addendum)
The x-ray does not show a fracture or dislocation.  The x-ray does show osteoarthritis. Apply medication as prescribed. Wear the knee brace as needed to help provide compression and support. Continue over-the-counter Tylenol as needed for pain.  Recommend Tylenol arthritis strength 650 mg tablets every 8 hours as needed. May apply ice as needed to help with swelling.  Apply for 20 minutes, remove for 1 hour, then repeat as needed. Gentle stretching exercises have been provided for you to perform when you are sitting. If symptoms do not improve, please follow-up with your primary care physician for further evaluation and possible referral to orthopedics. Follow-up as needed.

## 2022-11-21 NOTE — ED Provider Notes (Signed)
[facility]    CSN: [id number] Arrival date & time: 11/21/22  1311      History   Chief Complaint No chief complaint on file.   HPI Shane [name] is a 89 y.o. male.   The history is provided by the patient.   The patient presents for complaints of right knee pain has been present for the past several days.  Patient states that he mowed his yard using a self-propelled push lawnmower.  He states after he mowed the yard, he developed pain in the right knee.  Patient denies any injury, fall, trauma, numbness, tingling or inability to bear weight.  Patient states that the pain does go down to the top of his right lower leg.  He has been taking Tylenol for his symptoms.  He states he finds good relief with the Tylenol.  He denies previous history of arthritis.  Past Medical History:  Diagnosis Date   Arteriosclerotic cardiovascular disease (ASCVD)    CABG surgery in 1990. MI in 1986; coronary angiography in 2003-critical LAD with patent LIMA; total obstruction of the RCA with patent RIMA; low normal EF.   Atrial flutter, paroxysmal ([place])    asymptomatic; onset in 2010; 4:1 AVB with low-dose metoprolol; moderate left      atrial enlargement and mild left ventricular hypertrophy with normal ejection fraction by echocardiography   Benign prostatic hypertrophy    Bifascicular block    EKG 07/27/22: SB with SA, first-degree AV block, RBBB, left posterior fascicular block (bifascicular block) .   Bradycardia    a. requiring cessation of beta blocker 12/2016.   Chronic anticoagulation    Current use of long term anticoagulation    Remains on chronic Coumadin   Hyperlipidemia    Lipid profile in 09/2010:196, 84, 54, 125   Mobitz type 1 second degree atrioventricular block 12/17/2016   Nephrolithiasis    Polymyalgia rheumatica ([place])     Patient Active Problem List   Diagnosis Date Noted   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   Primary cancer of urinary bladder neck  ([place]) 03/01/2017   Mobitz type 1 second degree atrioventricular block 12/17/2016   1st degree AV block 12/17/2016   Symptomatic bradycardia 12/16/2016   Encounter for therapeutic drug monitoring 10/03/2013   Colon cancer screening 07/12/2013   Borderline hypertension 10/07/2011   Arteriosclerotic cardiovascular disease (ASCVD)    Hyperlipidemia    Chronic anticoagulation    Atrial flutter, paroxysmal ([place])    Nephrolithiasis 03/26/2009   Polymyalgia rheumatica ([place]) 03/26/2009   BPH (benign prostatic hyperplasia) 03/26/2009    Past Surgical History:  Procedure Laterality Date   COLONOSCOPY  08/21/01   RMR: Left-sided diverticulum.  Remainder of colonic mucosa appeared normal   COLONOSCOPY N/A 07/31/2013   Procedure: COLONOSCOPY;  Surgeon: Danie Binder, MD;  [facility];  Service: Endoscopy;  Laterality: N/A;  10:30   CORONARY ARTERY BYPASS GRAFT  1990   LUMBAR DISC SURGERY     TRANSURETHRAL RESECTION OF BLADDER TUMOR N/A 03/01/2017   Procedure: TRANSURETHRAL RESECTION OF BLADDER TUMOR (TURBT);  Surgeon: Franchot Gallo, MD;  Location: [facility];  Service: Urology;  Laterality: N/A;   TRANSURETHRAL RESECTION OF BLADDER TUMOR N/A 04/12/2017   Procedure: REPEAT TRANSURETHRAL RESECTION OF BLADDER TUMOR (TURBT);  Surgeon: Franchot Gallo, MD;  Location: [facility];  Service: Urology;  Laterality: N/A;  1 HR [phone] [place] Medications    Prior to Admission medications  Medication Sig Start Date End Date Taking? Authorizing Provider  diclofenac Sodium (VOLTAREN) 1 % GEL Apply 4 g topically 4 (four) times daily. 11/21/22  Yes Tyson Parkison-Warren, Alda Lea, NP  acetaminophen (TYLENOL) 500 MG tablet Take 1,000 mg by mouth every 8 (eight) hours as needed for mild pain.    [provider]  atorvastatin (LIPITOR) 80 MG tablet Take 1 tablet by mouth once daily 08/09/22   Finis Bud, NP  tamsulosin (FLOMAX) 0.4 MG CAPS capsule Take 0.4 mg  by mouth daily after supper.    [provider]  warfarin (COUMADIN) 5 MG tablet Take 1 to 1 1/2 tablets daily or as directed by coumadin clinic. 07/27/22   Arnoldo Lenis, MD    Family History Family History  Problem Relation Age of Onset   Heart attack Mother    Coronary artery disease Brother        CABG + pacemaker   Pneumonia Brother        infant death   Colon cancer Neg Hx    Colon polyps Neg Hx     Social History Social History   Tobacco Use   Smoking status: Former    Packs/day: 1.50    Years: 25.00    Additional pack years: 0.00    Total pack years: 37.50    Types: Cigarettes    Quit date: 10/07/1982    Years since quitting: 40.1    Passive exposure: Never   Smokeless tobacco: Never  Vaping Use   Vaping Use: Never used  Substance Use Topics   Alcohol use: Yes    Comment: 1 bottle of wine weekly; occasional beer   Drug use: No     Allergies   Patient has no known allergies.   Review of Systems Review of Systems Per HPI  Physical Exam Triage Vital Signs ED Triage Vitals  Enc Vitals Group     BP 11/21/22 1504 (!) 175/84     Pulse Rate 11/21/22 1504 (!) 58     Resp 11/21/22 1504 18     Temp 11/21/22 1504 (!) 97.4 F (36.3 C)     Temp Source 11/21/22 1504 Oral     SpO2 11/21/22 1504 93 %     Weight --      Height --      Head Circumference --      Peak Flow --      Pain Score 11/21/22 1505 5     Pain Loc --      Pain Edu? --      Excl. in [place]? --    No data found.  Updated Vital Signs BP (!) 175/84 (BP Location: Right Arm)   Pulse (!) 58   Temp (!) 97.4 F (36.3 C) (Oral)   Resp 18   SpO2 93%   Visual Acuity Right Eye Distance:   Left Eye Distance:   Bilateral Distance:    Right Eye Near:   Left Eye Near:    Bilateral Near:     Physical Exam Vitals and nursing note reviewed.  Constitutional:      General: He is not in acute distress.    Appearance: Normal appearance.  HENT:     Head: Normocephalic.  Eyes:      Extraocular Movements: Extraocular movements intact.     Pupils: Pupils are equal, round, and reactive to light.  Pulmonary:     Effort: Pulmonary effort is normal.  Musculoskeletal:     Cervical back: Normal range  of motion.     Right knee: No swelling, deformity or erythema. Normal range of motion. Tenderness present over the MCL. Normal pulse.  Skin:    General: Skin is warm and dry.  Neurological:     General: No focal deficit present.     Mental Status: He is alert and oriented to person, place, and time.  Psychiatric:        Mood and Affect: Mood normal.        Behavior: Behavior normal.      UC Treatments / Results  Labs (all labs ordered are listed, but only abnormal results are displayed) Labs Reviewed - No data to display  EKG   Radiology DG Knee Complete 4 Views Right  Result Date: 11/21/2022 CLINICAL DATA:  Right knee pain 1 week. EXAM: RIGHT KNEE - COMPLETE 4+ VIEW COMPARISON:  None Available. FINDINGS: Mild diffuse trabecular prominence mild tricompartmental osteoarthritic change. No acute fracture or dislocation. No significant joint effusion. Calcified plaque over the femoral, popliteal and lower leg arteries. IMPRESSION: 1. No acute findings. 2. Mild tricompartmental osteoarthritic change. Electronically Signed   By: Marin Olp M.D.   On: 11/21/2022 16:15    Procedures Procedures (including critical care time)  Medications Ordered in UC Medications - No data to display  Initial Impression / Assessment and Plan / UC Course  I have reviewed the triage vital signs and the nursing notes.  Pertinent labs & imaging results that were available during my care of the patient were reviewed by me and considered in my medical decision making (see chart for details).  The patient is well-appearing, he is hypertensive and mildly bradycardic, but is in no acute distress.  X-ray is consistent with osteoarthritis.  Hinged knee brace was provided to the patient to  provide patient with compression and support.  Patient was prescribed diclofenac 1% gel to apply to the right knee to help with pain or discomfort.  Patient can also continue Tylenol as needed.  Supportive care recommendations were provided to the patient to include the use of ice, and use of stretching to help with knee pain.  Patient was advised that if symptoms do not improve, he will need to follow-up with his primary care physician for further evaluation.  Patient is in agreement with this plan of care and verbalizes understanding.  All questions were answered.  Patient stable for discharge.   Final Clinical Impressions(s) / UC Diagnoses   Final diagnoses:  Right knee pain, unspecified chronicity     Discharge Instructions      The x-ray does not show a fracture or dislocation.  The x-ray does show osteoarthritis. Apply medication as prescribed. Wear the knee brace as needed to help provide compression and support. Continue over-the-counter Tylenol as needed for pain.  Recommend Tylenol arthritis strength 650 mg tablets every 8 hours as needed. May apply ice as needed to help with swelling.  Apply for 20 minutes, remove for 1 hour, then repeat as needed. Gentle stretching exercises have been provided for you to perform when you are sitting. If symptoms do not improve, please follow-up with your primary care physician for further evaluation and possible referral to orthopedics. Follow-up as needed.     ED Prescriptions     Medication Sig Dispense Auth. Provider   diclofenac Sodium (VOLTAREN) 1 % GEL Apply 4 g topically 4 (four) times daily. 150 g Jacaden Forbush-Warren, Alda Lea, NP      PDMP not reviewed this encounter.   Lollie Sails  J, NP 11/21/22 1625

## 2022-11-21 NOTE — ED Triage Notes (Signed)
Pain in right leg since Monday.  Pain around knee area.  Pain when walking.  States he noticed the pain after mowing. Has been taking tylenol

## 2022-11-25 ENCOUNTER — Ambulatory Visit: Payer: Medicare HMO | Attending: Cardiology

## 2022-11-25 DIAGNOSIS — Z5181 Encounter for therapeutic drug level monitoring: Secondary | ICD-10-CM | POA: Diagnosis not present

## 2022-11-25 DIAGNOSIS — I4892 Unspecified atrial flutter: Secondary | ICD-10-CM | POA: Diagnosis not present

## 2022-11-25 LAB — POCT INR: INR: 6.5 — AB (ref 2.0–3.0)

## 2022-11-25 NOTE — Patient Instructions (Addendum)
    Description   Skip 3 dosages of Warfarin, then start taking 1.5 tablets daily except 1 tablet on Sundays and Thursdays.  Eat some extra green leafy foods today and tomorrow, then resume consistent green leafy vegetable intake 3 days weekly. Go to the ED with bleeding problems.  Recheck in 1 week.

## 2022-12-02 ENCOUNTER — Ambulatory Visit: Payer: Medicare HMO | Attending: Cardiology | Admitting: *Deleted

## 2022-12-02 DIAGNOSIS — Z5181 Encounter for therapeutic drug level monitoring: Secondary | ICD-10-CM

## 2022-12-02 DIAGNOSIS — I4892 Unspecified atrial flutter: Secondary | ICD-10-CM | POA: Diagnosis not present

## 2022-12-02 LAB — POCT INR: INR: 1.7 — AB (ref 2.0–3.0)

## 2022-12-02 NOTE — Patient Instructions (Signed)
Restart warfarin 1 1/2 tablets daily Continue green leafy vegetable intake 3 days weekly.  Recheck in 2 wks

## 2022-12-16 ENCOUNTER — Ambulatory Visit: Payer: Medicare HMO | Attending: Cardiology | Admitting: *Deleted

## 2022-12-16 DIAGNOSIS — Z5181 Encounter for therapeutic drug level monitoring: Secondary | ICD-10-CM

## 2022-12-16 DIAGNOSIS — I4892 Unspecified atrial flutter: Secondary | ICD-10-CM | POA: Diagnosis not present

## 2022-12-16 LAB — POCT INR: INR: 2.9 (ref 2.0–3.0)

## 2022-12-16 NOTE — Patient Instructions (Signed)
Continue warfarin 1 1/2 tablets daily Continue green leafy vegetable intake 3 days weekly.  Recheck in 4 wks

## 2022-12-21 ENCOUNTER — Ambulatory Visit: Payer: Medicare HMO | Admitting: Urology

## 2022-12-21 ENCOUNTER — Encounter: Payer: Self-pay | Admitting: Urology

## 2022-12-21 VITALS — BP 119/72 | HR 75 | Ht 68.0 in | Wt 152.0 lb

## 2022-12-21 DIAGNOSIS — C679 Malignant neoplasm of bladder, unspecified: Secondary | ICD-10-CM

## 2022-12-21 DIAGNOSIS — Z8551 Personal history of malignant neoplasm of bladder: Secondary | ICD-10-CM | POA: Diagnosis not present

## 2022-12-21 LAB — URINALYSIS, ROUTINE W REFLEX MICROSCOPIC
Bilirubin, UA: NEGATIVE
Glucose, UA: NEGATIVE
Ketones, UA: NEGATIVE
Leukocytes,UA: NEGATIVE
Nitrite, UA: NEGATIVE
Protein,UA: NEGATIVE
Specific Gravity, UA: 1.02 (ref 1.005–1.030)
Urobilinogen, Ur: 2 mg/dL — ABNORMAL HIGH (ref 0.2–1.0)
pH, UA: 7 (ref 5.0–7.5)

## 2022-12-21 LAB — MICROSCOPIC EXAMINATION: Bacteria, UA: NONE SEEN

## 2022-12-21 MED ORDER — CIPROFLOXACIN HCL 500 MG PO TABS
500.0000 mg | ORAL_TABLET | Freq: Once | ORAL | Status: AC
  Administered 2022-12-21: 500 mg via ORAL

## 2022-12-21 NOTE — Progress Notes (Signed)
History of Present Illness: Shane [name] underwent TURBT of an extensive bladder neck/anterior bladder wall tumor on 6.26.2018. Mitomycin was left in postoperatively. 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   Repeat TURBT performed on 8.7.2018. Pathology revealed only benign inflammatory changes.   Shane [name] completed 6 induction treatments of BCG on 10.16.2018.   11.27.2018--cysto showed moderate trabeculation, erythematous urothelium in the bladder neck/trigonal region, most likely related to prior resection and BCG. Cytology revealed no evidence of high grade urothelial carcinoma.   1.22.2019: completed 1st course of maintenance BCG.   4.2.2019: Cysto negative, cytology nondiagnostic (scant cellularity)   4.30.2019: Completed 2nd maintenance BCG   8.13.2019: No recent hematuria. Cystoscopy and cytology were negative.   9.11.2019: Shane [name] completed his 3rd maintenance BCG series.   10.22.2019: Cysto negative, BCG Rx's discontinued.   5.12.2020: Cysto negative   11.17.2020: Cystoscopy negative   8.17.2021; Cystoscopy negative   10.4.2022: Cystoscopy negative.  Cytology inconclusive.  4.16.2024: It has been a year and a half since Shane [name] came in.  Shane [name] thinks that Dr. Ouida Sills might have sent him back.  Shane [name] denies any gross hematuria or significant voiding issues.  Still on tamsulosin.  Past Medical History:  Diagnosis Date   Arteriosclerotic cardiovascular disease (ASCVD)    CABG surgery in 1990. MI in 1986; coronary angiography in 2003-critical LAD with patent LIMA; total obstruction of the RCA with patent RIMA; low normal EF.   Atrial flutter, paroxysmal    asymptomatic; onset in 2010; 4:1 AVB with low-dose metoprolol; moderate left      atrial enlargement and mild left ventricular hypertrophy with normal ejection fraction by echocardiography   Benign prostatic hypertrophy    Bifascicular block    EKG 07/27/22: SB with SA, first-degree AV block, RBBB, left posterior fascicular  block (bifascicular block) .   Bradycardia    a. requiring cessation of beta blocker 12/2016.   Chronic anticoagulation    Current use of long term anticoagulation    Remains on chronic Coumadin   Hyperlipidemia    Lipid profile in 09/2010:196, 84, 54, 125   Mobitz type 1 second degree atrioventricular block 12/17/2016   Nephrolithiasis    Polymyalgia rheumatica     Past Surgical History:  Procedure Laterality Date   COLONOSCOPY  08/21/01   RMR: Left-sided diverticulum.  Remainder of colonic mucosa appeared normal   COLONOSCOPY N/A 07/31/2013   Procedure: COLONOSCOPY;  Surgeon: West Bali, MD;  Location: [facility];  Service: Endoscopy;  Laterality: N/A;  10:30   CORONARY ARTERY BYPASS GRAFT  1990   LUMBAR DISC SURGERY     TRANSURETHRAL RESECTION OF BLADDER TUMOR N/A 03/01/2017   Procedure: TRANSURETHRAL RESECTION OF BLADDER TUMOR (TURBT);  Surgeon: Marcine Matar, MD;  Location: [facility];  Service: Urology;  Laterality: N/A;   TRANSURETHRAL RESECTION OF BLADDER TUMOR N/A 04/12/2017   Procedure: REPEAT TRANSURETHRAL RESECTION OF BLADDER TUMOR (TURBT);  Surgeon: Marcine Matar, MD;  Location: [facility];  Service: Urology;  Laterality: N/A;  1 HR [phone] [employer] [id number]    Home Medications:  Allergies as of 12/21/2022   No Known Allergies      Medication List        Accurate as of December 21, 2022  3:46 PM. If you have any questions, ask your nurse or doctor.          acetaminophen 500 MG tablet Commonly known as: TYLENOL Take 1,000 mg by mouth every 8 (eight) hours as needed for mild pain.   atorvastatin 80 MG  tablet Commonly known as: LIPITOR Take 1 tablet by mouth once daily   diclofenac Sodium 1 % Gel Commonly known as: VOLTAREN Apply 4 g topically 4 (four) times daily.   tamsulosin 0.4 MG Caps capsule Commonly known as: FLOMAX Take 0.4 mg by mouth daily after supper.   warfarin 5 MG tablet Commonly known as: COUMADIN Take as directed by  the anticoagulation clinic. If you are unsure how to take this medication, talk to your nurse or doctor. Original instructions: Take 1 to 1 1/2 tablets daily or as directed by coumadin clinic.        Allergies: No Known Allergies  Family History  Problem Relation Age of Onset   Heart attack Mother    Coronary artery disease Brother        CABG + pacemaker   Pneumonia Brother        infant death   Colon cancer Neg Hx    Colon polyps Neg Hx     Social History:  reports that Shane [name] quit smoking about 40 years ago. His smoking use included cigarettes. Shane [name] has a 37.50 pack-year smoking history. Shane [name] has never been exposed to tobacco smoke. Shane [name] has never used smokeless tobacco. Shane [name] reports current alcohol use. Shane [name] reports that Shane [name] does not use drugs.  ROS: A complete review of systems was performed.  All systems are negative except for pertinent findings as noted.  Physical Exam:  Vital signs in last 24 hours: BP 119/72   Pulse 75   Ht 5\' 8"  (1.727 m)   Wt 152 lb (68.9 kg)   BMI 23.11 kg/m  Constitutional:  Alert and oriented, No acute distress Cardiovascular: Regular rate  Respiratory: Normal respiratory effort GI: Abdomen is soft, nontender, nondistended, no abdominal masses. No CVAT.  Genitourinary: Normal male phallus, testes are descended bilaterally and non-tender and without masses, scrotum is normal in appearance without lesions or masses, perineum is normal on inspection. Lymphatic: No lymphadenopathy Neurologic: Grossly intact, no focal deficits Psychiatric: Normal mood and affect  I have reviewed prior pt notes  I have reviewed notes from referring/previous physicians--prior labs reviewd  I have reviewed urinalysis results--microhematuria  I have independently reviewed prior imaging   Cystoscopy Procedure Note:  Indication: Surveillance for bladder cancer   After informed consent and discussion of the procedure and its risks, Shane [name] was positioned and prepped  in the standard fashion.  Cystoscopy was performed with a flexible cystoscope.   Findings: Urethra:No stricture/lesion Prostate:Non obstructing Ureteral orifices:normal Bladder:3+ trabecs. No urothelial lesions/stones  The patient tolerated the procedure well.     Impression/Assessment:  History of NMIBC--no evidence of recurrence  Plan:  Cytology sent  OV 1 year for cysto

## 2023-01-13 ENCOUNTER — Ambulatory Visit: Payer: Medicare HMO | Attending: Cardiology | Admitting: *Deleted

## 2023-01-13 DIAGNOSIS — I4892 Unspecified atrial flutter: Secondary | ICD-10-CM | POA: Diagnosis not present

## 2023-01-13 DIAGNOSIS — Z5181 Encounter for therapeutic drug level monitoring: Secondary | ICD-10-CM | POA: Diagnosis not present

## 2023-01-13 LAB — POCT INR: INR: 2.4 (ref 2.0–3.0)

## 2023-01-13 NOTE — Patient Instructions (Signed)
Continue warfarin 1 1/2 tablets daily Continue green leafy vegetable intake 3 days weekly.  Recheck in 6 wks

## 2023-01-26 ENCOUNTER — Ambulatory Visit: Payer: Medicare HMO | Attending: Cardiology | Admitting: Cardiology

## 2023-01-26 VITALS — BP 142/70 | HR 64 | Ht 68.0 in | Wt 165.0 lb

## 2023-01-26 DIAGNOSIS — R001 Bradycardia, unspecified: Secondary | ICD-10-CM

## 2023-01-26 DIAGNOSIS — I2510 Atherosclerotic heart disease of native coronary artery without angina pectoris: Secondary | ICD-10-CM | POA: Diagnosis not present

## 2023-01-26 DIAGNOSIS — E782 Mixed hyperlipidemia: Secondary | ICD-10-CM | POA: Diagnosis not present

## 2023-01-26 DIAGNOSIS — I4892 Unspecified atrial flutter: Secondary | ICD-10-CM | POA: Diagnosis not present

## 2023-01-26 NOTE — Progress Notes (Signed)
Clinical Summary Mr. Fuquay is a 89 y.o.male seen today for follow up of the following medical problems.    1. CAD - prior CABG in 1990 - last cath 2003 as reported below. - Jan 2010 echo LVEF 55-60%         - walks 2-3 miles a day without symptoms - no chest pain, no SOB/DOE - compliant with meds   2. Hyperlipidemia - 12/2022 TC 143 TG 62 HDL 63 LDL 67     3. Aflutter - has not required av nodal agents  - no palpitations - no bleeding on coumadin. Has not wanted to change to DOAC   5. Chronic bradycardia/AV block - 2:1 AV block noted on holter. FOllowed by EP for mobitz type I undergoing watchful waiting.    - no reent symptoms.      SH: married 63 years. Son is an ob/gyn in [employer], another son is a Charity fundraiser who lived in [place] 5 years moving to [place].    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 Recently just stopped working.   Past Medical History:  Diagnosis Date   Arteriosclerotic cardiovascular disease (ASCVD)    CABG surgery in 1990. MI in 1986; coronary angiography in 2003-critical LAD with patent LIMA; total obstruction of the RCA with patent RIMA; low normal EF.   Atrial flutter, paroxysmal (HCC)    asymptomatic; onset in 2010; 4:1 AVB with low-dose metoprolol; moderate left      atrial enlargement and mild left ventricular hypertrophy with normal ejection fraction by echocardiography   Benign prostatic hypertrophy    Bifascicular block    EKG 07/27/22: SB with SA, first-degree AV block, RBBB, left posterior fascicular block (bifascicular block) .   Bradycardia    a. requiring cessation of beta blocker 12/2016.   Chronic anticoagulation    Current use of long term anticoagulation    Remains on chronic Coumadin   Hyperlipidemia    Lipid profile in 09/2010:196, 84, 54, 125   Mobitz type 1 second degree atrioventricular block 12/17/2016   Nephrolithiasis    Polymyalgia rheumatica (HCC)      No Known Allergies   Current  Outpatient Medications  Medication Sig Dispense Refill   acetaminophen (TYLENOL) 500 MG tablet Take 1,000 mg by mouth every 8 (eight) hours as needed for mild pain.     atorvastatin (LIPITOR) 80 MG tablet Take 1 tablet by mouth once daily 90 tablet 3   diclofenac Sodium (VOLTAREN) 1 % GEL Apply 4 g topically 4 (four) times daily. 150 g 0   tamsulosin (FLOMAX) 0.4 MG CAPS capsule Take 0.4 mg by mouth daily after supper.     warfarin (COUMADIN) 5 MG tablet Take 1 to 1 1/2 tablets daily or as directed by coumadin clinic. 40 tablet 5   No current facility-administered medications for this visit.     Past Surgical History:  Procedure Laterality Date   COLONOSCOPY  08/21/01   RMR: Left-sided diverticulum.  Remainder of colonic mucosa appeared normal   COLONOSCOPY N/A 07/31/2013   Procedure: COLONOSCOPY;  Surgeon: West Bali, MD;  [facility];  Service: Endoscopy;  Laterality: N/A;  10:30   CORONARY ARTERY BYPASS GRAFT  1990   LUMBAR DISC SURGERY     TRANSURETHRAL RESECTION OF BLADDER TUMOR N/A 03/01/2017   Procedure: TRANSURETHRAL RESECTION OF BLADDER TUMOR (TURBT);  Surgeon: Marcine Matar, MD;  Location: [facility];  Service: Urology;  Laterality: N/A;  TRANSURETHRAL RESECTION OF BLADDER TUMOR N/A 04/12/2017   Procedure: REPEAT TRANSURETHRAL RESECTION OF BLADDER TUMOR (TURBT);  Surgeon: Marcine Matar, MD;  Location: [facility];  Service: Urology;  Laterality: N/A;  1 HR [phone] [employer] MEDICARE-[id number]     No Known Allergies    Family History  Problem Relation Age of Onset   Heart attack Mother    Coronary artery disease Brother        CABG + pacemaker   Pneumonia Brother        infant death   Colon cancer Neg Hx    Colon polyps Neg Hx      Social History Mr. Birts reports that he quit smoking about 40 years ago. His smoking use included cigarettes. He has a 37.50 pack-year smoking history. He has never been exposed to tobacco smoke. He has never used  smokeless tobacco. Mr. Regehr reports current alcohol use.   Review of Systems CONSTITUTIONAL: No weight loss, fever, chills, weakness or fatigue.  HEENT: Eyes: No visual loss, blurred vision, double vision or yellow sclerae.No hearing loss, sneezing, congestion, runny nose or sore throat.  SKIN: No rash or itching.  CARDIOVASCULAR: per hpi RESPIRATORY: No shortness of breath, cough or sputum.  GASTROINTESTINAL: No anorexia, nausea, vomiting or diarrhea. No abdominal pain or blood.  GENITOURINARY: No burning on urination, no polyuria NEUROLOGICAL: No headache, dizziness, syncope, paralysis, ataxia, numbness or tingling in the extremities. No change in bowel or bladder control.  MUSCULOSKELETAL: No muscle, back pain, joint pain or stiffness.  LYMPHATICS: No enlarged nodes. No history of splenectomy.  PSYCHIATRIC: No history of depression or anxiety.  ENDOCRINOLOGIC: No reports of sweating, cold or heat intolerance. No polyuria or polydipsia.  [name]   Physical Examination Today's Vitals   01/26/23 0957  BP: (!) 142/70  Pulse: 64  SpO2: 97%  Weight: 165 lb (74.8 kg)  Height: 5\' 8"  (1.727 m)   Body mass index is 25.09 kg/m.  Gen: resting comfortably, no acute distress HEENT: no scleral icterus, pupils equal round and reactive, no palptable cervical adenopathy,  CV: RRR, no m/rg, no jvd Resp: Clear to auscultation bilaterally GI: abdomen is soft, non-tender, non-distended, normal bowel sounds, no hepatosplenomegaly MSK: extremities are warm, no edema.  Skin: warm, no rash Neuro:  no focal deficits Psych: appropriate affect   Diagnostic Studies  Cath 07/2002 FINDINGS:   1. Left main trunk. Medium caliber vessel with mild ________.   2. LAD. This is a medium caliber vessel which supplies a trivial first   diagonal Lyndell Gillyard proximal segment, medium caliber second diagonal Kameisha Malicki   thereafter. The LAD then extends to the apex. The LAD has moderate   disease, 50% of the proximal left  segment, which then extends into a   narrowing of 70% encompassing the trivial diagonal Manson Luckadoo. The distal   LAD has mild irregularities and is seen to fill predominantly via the   LIMA graft. There is mid narrowing of 30%. The second diagonal Ballard Budney   has an ostial narrowing of 50% and fills predominantly via antegrade   flow.   3. Left circumflex artery. This is a medium caliber vessel that supplies a   small first marginal [place] and the proximal segment a larger second   marginal Marcey Persad. In the mid-section, there is moderate narrowing of 30-   40% of the proximal segment of the second marginal Kerem Gilmer.   4. Right coronary artery. This was a dominant medium caliber vessel that   supplies the posterior descending artery  and a posterior ventricular   Kiam Bransfield in its terminal segment. The right coronary artery is 100%   occluded in the mid-section. The distal vessel fills the in situ RIMA   graft anastomosed to the distal right coronary artery. The posterior   descending artery and the posterior ventricular Latashia Koch have mild _______   of 30%.   5. RIMA to the distal right coronary artery is patent. This was an in situ   graft.   6. LIMA to the LAD is patent. This was also an in situ graft.   7. Left ventricle. Normal end-systolic and end-diastolic dimensions.   Normal left ventricular function is well preserved. Ejection fraction 50-   55%. No mitral regurgitation. LV pressure is 120/5. Aortic is 120/65.   LVEDP is 15.   ASSESSMENT AND PLAN: The patient is a 89 year old gentleman with two-vessel   coronary artery disease that is well revascularized surgically. He has well-   preserved LV function. The only concern is the second diagonal Aira Sallade which   is compromised by moderate disease in the proximal and mid-LAD. This does   not appear to be critical in nature; however, this disease is presumptively   the reason the patient underwent bypass surgery. In addition, the amount of   myocardium  supplied by this diagonal Varina Hulon is relatively small and is   unlikely to elicit the symptoms the patient presented with. We will thus   pursue a conservative course of medical therapy should the patient have   recurrent symptoms or an abnormal stress imaging study. Percutaneous   intervention may be considered to improve flow to the second diagonal   Arland Usery.     Jan 2010 Echo SUMMARY - Overall left ventricular systolic function was normal. Left ventricular ejection fraction was estimated , range being 55 % to 60 %. There were no left ventricular regional wall motion abnormalities. Left ventricular wall thickness was mildly increased. There was moderate basal septal hypertrophy. - The aortic valve was mildly calcified. - There was mild fibrocalcific change of the aortic root. - The effective orifice of mitral regurgitation by proximal isovelocity surface area was 0.13 cm^2. The volume of mitral regurgitation by proximal isovelocity surface area was 19 cc. - The left atrium was moderately dilated. - There was mild right ventricular hypertrophy. - The right atrium was mild to moderately dilated.   Assessment and Plan  1. CAD - no recent symptmos - continue current meds  2. Hyperlipidemia - continue statin, request pcp labs   3. Aflutter - has not been on av nodal agent due to bradycardia - not interested in NOACs, remains on coumaidn - no symptmos, continue current meds  4. Bradycardia/AV block 2nd degree -Has been evaluated by EP - watchful waiting - no symptoms, conitnue to monitor at this time  F/u 6 months      Antoine Poche, M.D.

## 2023-01-26 NOTE — Patient Instructions (Signed)
Medication Instructions:  Your physician recommends that you continue on your current medications as directed. Please refer to the Current Medication list given to you today.   Labwork: None  Testing/Procedures: None  Follow-Up: Your physician recommends that you schedule a follow-up appointment in: 6 month follow up  Any Other Special Instructions Will Be Listed Below (If Applicable).  If you need a refill on your cardiac medications before your next appointment, please call your pharmacy.  

## 2023-02-24 ENCOUNTER — Ambulatory Visit: Payer: Medicare HMO | Attending: Cardiology | Admitting: *Deleted

## 2023-02-24 DIAGNOSIS — I4892 Unspecified atrial flutter: Secondary | ICD-10-CM

## 2023-02-24 DIAGNOSIS — Z5181 Encounter for therapeutic drug level monitoring: Secondary | ICD-10-CM

## 2023-02-24 LAB — POCT INR: INR: 2.6 (ref 2.0–3.0)

## 2023-02-24 NOTE — Patient Instructions (Signed)
Continue warfarin 1 1/2 tablets daily Continue green leafy vegetable intake 3 days weekly.  Recheck in 6 wks 

## 2023-02-28 ENCOUNTER — Other Ambulatory Visit: Payer: Self-pay | Admitting: Cardiology

## 2023-03-01 NOTE — Telephone Encounter (Signed)
Refill request for warfarin:  Last INR was 2.6 on 02/24/23 Next INR due 04/07/23 LOV was 01/26/23  Refill approved.

## 2023-04-07 ENCOUNTER — Ambulatory Visit: Payer: Medicare HMO | Attending: Cardiology | Admitting: *Deleted

## 2023-04-07 DIAGNOSIS — I4892 Unspecified atrial flutter: Secondary | ICD-10-CM

## 2023-04-07 DIAGNOSIS — Z5181 Encounter for therapeutic drug level monitoring: Secondary | ICD-10-CM | POA: Diagnosis not present

## 2023-04-07 LAB — POCT INR: INR: 3 (ref 2.0–3.0)

## 2023-04-07 NOTE — Patient Instructions (Signed)
Continue warfarin 1 1/2 tablets daily Continue green leafy vegetable intake 3 days weekly.  Recheck in 6 wks

## 2023-05-19 ENCOUNTER — Ambulatory Visit: Payer: Medicare HMO | Attending: Cardiology | Admitting: *Deleted

## 2023-05-19 ENCOUNTER — Other Ambulatory Visit (HOSPITAL_COMMUNITY)
Admission: RE | Admit: 2023-05-19 | Discharge: 2023-05-19 | Disposition: A | Discharge status: Home / Self Care | Payer: Medicare HMO | Source: Ambulatory Visit | Attending: Cardiology | Admitting: Cardiology

## 2023-05-19 DIAGNOSIS — Z5181 Encounter for therapeutic drug level monitoring: Secondary | ICD-10-CM | POA: Diagnosis not present

## 2023-05-19 DIAGNOSIS — I4892 Unspecified atrial flutter: Secondary | ICD-10-CM | POA: Diagnosis present

## 2023-05-19 LAB — POCT INR: INR: 8 — AB (ref 2.0–3.0)

## 2023-05-19 LAB — PROTIME-INR
INR: 6.3 (ref 0.8–1.2)
Prothrombin Time: 55.6 s — ABNORMAL HIGH (ref 11.4–15.2)

## 2023-05-19 NOTE — Patient Instructions (Addendum)
Hold warfarin x 3 days then decrease dose to 1 1/2 tablets daily except 1 tablet on Sundays, Tuesdays and Thursdays Recheck in 1 week Called and gave instructions to granddaughter (CG) per request of pt's son.  She will tell pt and make sure he understands. Called pt also.  No answer/no voicemail

## 2023-05-26 ENCOUNTER — Ambulatory Visit: Payer: Medicare HMO | Attending: Cardiology

## 2023-05-26 DIAGNOSIS — I4892 Unspecified atrial flutter: Secondary | ICD-10-CM

## 2023-05-26 DIAGNOSIS — Z5181 Encounter for therapeutic drug level monitoring: Secondary | ICD-10-CM | POA: Diagnosis not present

## 2023-05-26 LAB — POCT INR: INR: 1.3 — AB (ref 2.0–3.0)

## 2023-05-26 NOTE — Patient Instructions (Signed)
Description   Take an extra 1/2 tablet today (1.5 tablets) and tomorrow (2 tablets), then resume same dosage of Warfarin 1.5 tablets daily except 1 tablet on Sundays, Tuesdays and Thursdays Recheck in 1 week

## 2023-06-02 ENCOUNTER — Ambulatory Visit: Payer: Medicare HMO | Attending: Cardiology | Admitting: *Deleted

## 2023-06-02 DIAGNOSIS — I4892 Unspecified atrial flutter: Secondary | ICD-10-CM

## 2023-06-02 DIAGNOSIS — Z5181 Encounter for therapeutic drug level monitoring: Secondary | ICD-10-CM | POA: Diagnosis not present

## 2023-06-02 LAB — POCT INR: INR: 1.8 — AB (ref 2.0–3.0)

## 2023-06-02 NOTE — Patient Instructions (Signed)
Take warfarin 1 1/2 tablets tonight then increase dose to 1 1/2 tablets daily except 1 tablet on Sundays and Thursdays Recheck in 3 week

## 2023-06-22 ENCOUNTER — Ambulatory Visit: Payer: Medicare HMO | Attending: Cardiology | Admitting: *Deleted

## 2023-06-22 DIAGNOSIS — I4892 Unspecified atrial flutter: Secondary | ICD-10-CM | POA: Diagnosis not present

## 2023-06-22 DIAGNOSIS — Z5181 Encounter for therapeutic drug level monitoring: Secondary | ICD-10-CM | POA: Diagnosis not present

## 2023-06-22 LAB — POCT INR: INR: 3.3 — AB (ref 2.0–3.0)

## 2023-06-22 NOTE — Patient Instructions (Signed)
Hold warfarin tonight then resume 1 1/2 tablets daily except 1 tablet on Sundays and Thursdays Recheck in 3 weeks

## 2023-06-30 ENCOUNTER — Emergency Department (HOSPITAL_COMMUNITY): Payer: Medicare HMO

## 2023-06-30 ENCOUNTER — Inpatient Hospital Stay (HOSPITAL_COMMUNITY)
Admission: EM | Admit: 2023-06-30 | Discharge: 2023-07-06 | DRG: 243 | Disposition: A | Discharge status: Home / Self Care | Payer: Medicare HMO | Attending: Family Medicine | Admitting: Family Medicine

## 2023-06-30 ENCOUNTER — Encounter (HOSPITAL_COMMUNITY): Payer: Self-pay

## 2023-06-30 ENCOUNTER — Other Ambulatory Visit: Payer: Self-pay

## 2023-06-30 DIAGNOSIS — R001 Bradycardia, unspecified: Secondary | ICD-10-CM | POA: Diagnosis not present

## 2023-06-30 DIAGNOSIS — I441 Atrioventricular block, second degree: Secondary | ICD-10-CM | POA: Diagnosis present

## 2023-06-30 DIAGNOSIS — I495 Sick sinus syndrome: Secondary | ICD-10-CM | POA: Diagnosis not present

## 2023-06-30 DIAGNOSIS — N400 Enlarged prostate without lower urinary tract symptoms: Secondary | ICD-10-CM | POA: Diagnosis present

## 2023-06-30 DIAGNOSIS — Z8249 Family history of ischemic heart disease and other diseases of the circulatory system: Secondary | ICD-10-CM

## 2023-06-30 DIAGNOSIS — I2510 Atherosclerotic heart disease of native coronary artery without angina pectoris: Secondary | ICD-10-CM | POA: Diagnosis not present

## 2023-06-30 DIAGNOSIS — I4892 Unspecified atrial flutter: Secondary | ICD-10-CM | POA: Diagnosis not present

## 2023-06-30 DIAGNOSIS — E041 Nontoxic single thyroid nodule: Secondary | ICD-10-CM | POA: Diagnosis present

## 2023-06-30 DIAGNOSIS — Z951 Presence of aortocoronary bypass graft: Secondary | ICD-10-CM

## 2023-06-30 DIAGNOSIS — F32A Depression, unspecified: Secondary | ICD-10-CM | POA: Diagnosis present

## 2023-06-30 DIAGNOSIS — I5032 Chronic diastolic (congestive) heart failure: Secondary | ICD-10-CM | POA: Diagnosis present

## 2023-06-30 DIAGNOSIS — Z87891 Personal history of nicotine dependence: Secondary | ICD-10-CM

## 2023-06-30 DIAGNOSIS — I110 Hypertensive heart disease with heart failure: Secondary | ICD-10-CM | POA: Diagnosis present

## 2023-06-30 DIAGNOSIS — R55 Syncope and collapse: Secondary | ICD-10-CM

## 2023-06-30 DIAGNOSIS — Z79899 Other long term (current) drug therapy: Secondary | ICD-10-CM

## 2023-06-30 DIAGNOSIS — E785 Hyperlipidemia, unspecified: Secondary | ICD-10-CM | POA: Diagnosis present

## 2023-06-30 DIAGNOSIS — W010XXA Fall on same level from slipping, tripping and stumbling without subsequent striking against object, initial encounter: Secondary | ICD-10-CM | POA: Diagnosis present

## 2023-06-30 DIAGNOSIS — Y92007 Garden or yard of unspecified non-institutional (private) residence as the place of occurrence of the external cause: Secondary | ICD-10-CM

## 2023-06-30 DIAGNOSIS — I252 Old myocardial infarction: Secondary | ICD-10-CM

## 2023-06-30 DIAGNOSIS — H9190 Unspecified hearing loss, unspecified ear: Secondary | ICD-10-CM | POA: Diagnosis present

## 2023-06-30 DIAGNOSIS — R5383 Other fatigue: Secondary | ICD-10-CM | POA: Diagnosis present

## 2023-06-30 DIAGNOSIS — M4802 Spinal stenosis, cervical region: Secondary | ICD-10-CM | POA: Diagnosis present

## 2023-06-30 DIAGNOSIS — R030 Elevated blood-pressure reading, without diagnosis of hypertension: Secondary | ICD-10-CM | POA: Diagnosis present

## 2023-06-30 DIAGNOSIS — E782 Mixed hyperlipidemia: Secondary | ICD-10-CM | POA: Diagnosis present

## 2023-06-30 DIAGNOSIS — Z8551 Personal history of malignant neoplasm of bladder: Secondary | ICD-10-CM

## 2023-06-30 DIAGNOSIS — I480 Paroxysmal atrial fibrillation: Secondary | ICD-10-CM | POA: Diagnosis present

## 2023-06-30 DIAGNOSIS — Z7901 Long term (current) use of anticoagulants: Secondary | ICD-10-CM

## 2023-06-30 DIAGNOSIS — M353 Polymyalgia rheumatica: Secondary | ICD-10-CM | POA: Diagnosis present

## 2023-06-30 DIAGNOSIS — R7401 Elevation of levels of liver transaminase levels: Secondary | ICD-10-CM | POA: Diagnosis present

## 2023-06-30 LAB — CBC WITH DIFFERENTIAL/PLATELET
Abs Immature Granulocytes: 0.07 10*3/uL (ref 0.00–0.07)
Basophils Absolute: 0 10*3/uL (ref 0.0–0.1)
Basophils Relative: 0 %
Eosinophils Absolute: 0 10*3/uL (ref 0.0–0.5)
Eosinophils Relative: 0 %
HCT: 40.4 % (ref 39.0–52.0)
Hemoglobin: 12.8 g/dL — ABNORMAL LOW (ref 13.0–17.0)
Immature Granulocytes: 1 %
Lymphocytes Relative: 4 %
Lymphs Abs: 0.6 10*3/uL — ABNORMAL LOW (ref 0.7–4.0)
MCH: 30.7 pg (ref 26.0–34.0)
MCHC: 31.7 g/dL (ref 30.0–36.0)
MCV: 96.9 fL (ref 80.0–100.0)
Monocytes Absolute: 0.9 10*3/uL (ref 0.1–1.0)
Monocytes Relative: 6 %
Neutro Abs: 12.6 10*3/uL — ABNORMAL HIGH (ref 1.7–7.7)
Neutrophils Relative %: 89 %
Platelets: 219 10*3/uL (ref 150–400)
RBC: 4.17 MIL/uL — ABNORMAL LOW (ref 4.22–5.81)
RDW: 15.9 % — ABNORMAL HIGH (ref 11.5–15.5)
WBC: 14.1 10*3/uL — ABNORMAL HIGH (ref 4.0–10.5)
nRBC: 0 % (ref 0.0–0.2)

## 2023-06-30 LAB — URINALYSIS, ROUTINE W REFLEX MICROSCOPIC
Bacteria, UA: NONE SEEN
Bilirubin Urine: NEGATIVE
Glucose, UA: NEGATIVE mg/dL
Ketones, ur: 5 mg/dL — AB
Leukocytes,Ua: NEGATIVE
Nitrite: NEGATIVE
Protein, ur: 100 mg/dL — AB
Specific Gravity, Urine: 1.026 (ref 1.005–1.030)
pH: 5 (ref 5.0–8.0)

## 2023-06-30 LAB — PROTIME-INR
INR: 3.3 — ABNORMAL HIGH (ref 0.8–1.2)
Prothrombin Time: 33.5 s — ABNORMAL HIGH (ref 11.4–15.2)

## 2023-06-30 LAB — BASIC METABOLIC PANEL
Anion gap: 8 (ref 5–15)
BUN: 24 mg/dL — ABNORMAL HIGH (ref 8–23)
CO2: 25 mmol/L (ref 22–32)
Calcium: 8.9 mg/dL (ref 8.9–10.3)
Chloride: 107 mmol/L (ref 98–111)
Creatinine, Ser: 0.58 mg/dL — ABNORMAL LOW (ref 0.61–1.24)
GFR, Estimated: >60 mL/min (ref >60)
Glucose, Bld: 117 mg/dL — ABNORMAL HIGH (ref 70–99)
Potassium: 3.6 mmol/L (ref 3.5–5.1)
Sodium: 140 mmol/L (ref 135–145)

## 2023-06-30 LAB — TSH: TSH: 0.337 u[IU]/mL — ABNORMAL LOW (ref 0.350–4.500)

## 2023-06-30 MED ORDER — POLYETHYLENE GLYCOL 3350 17 G PO PACK
17.0000 g | PACK | Freq: Every day | ORAL | Status: DC | PRN
Start: 2023-06-30 — End: 2023-07-06

## 2023-06-30 MED ORDER — ESCITALOPRAM OXALATE 10 MG PO TABS
5.0000 mg | ORAL_TABLET | Freq: Every evening | ORAL | Status: DC
  Administered 2023-06-30 – 2023-07-05 (×6): 5 mg via ORAL
  Filled 2023-06-30 (×6): qty 1

## 2023-06-30 MED ORDER — ALBUTEROL SULFATE (2.5 MG/3ML) 0.083% IN NEBU: 2.5000 mg | INHALATION_SOLUTION | RESPIRATORY_TRACT | Status: DC | PRN

## 2023-06-30 MED ORDER — PHYTONADIONE 5 MG PO TABS
5.0000 mg | ORAL_TABLET | Freq: Once | ORAL | Status: AC
  Administered 2023-06-30: 5 mg via ORAL
  Filled 2023-06-30: qty 1

## 2023-06-30 MED ORDER — TAMSULOSIN HCL 0.4 MG PO CAPS
0.4000 mg | ORAL_CAPSULE | Freq: Every day | ORAL | Status: DC
  Administered 2023-06-30 – 2023-07-05 (×6): 0.4 mg via ORAL
  Filled 2023-06-30 (×6): qty 1

## 2023-06-30 MED ORDER — HYDRALAZINE HCL 20 MG/ML IJ SOLN
2.0000 mg | Freq: Four times a day (QID) | INTRAMUSCULAR | Status: DC | PRN
Start: 2023-06-30 — End: 2023-07-06

## 2023-06-30 MED ORDER — ACETAMINOPHEN 650 MG RE SUPP: 650.0000 mg | Freq: Four times a day (QID) | RECTAL | Status: DC | PRN

## 2023-06-30 MED ORDER — ACETAMINOPHEN 325 MG PO TABS
650.0000 mg | ORAL_TABLET | Freq: Four times a day (QID) | ORAL | Status: DC | PRN
Start: 2023-06-30 — End: 2023-07-04
  Administered 2023-07-01 – 2023-07-02 (×2): 650 mg via ORAL
  Filled 2023-06-30 (×2): qty 2

## 2023-06-30 MED ORDER — ATORVASTATIN CALCIUM 80 MG PO TABS
80.0000 mg | ORAL_TABLET | Freq: Every day | ORAL | Status: DC
  Administered 2023-06-30 – 2023-07-04 (×5): 80 mg via ORAL
  Filled 2023-06-30 (×4): qty 1
  Filled 2023-06-30: qty 2

## 2023-06-30 NOTE — ED Notes (Signed)
Patient transported to X-ray 

## 2023-06-30 NOTE — ED Notes (Signed)
Cardiology at bedside.

## 2023-06-30 NOTE — Consult Note (Signed)
Cardiology Consultation:   Patient ID: Shane [name]; [id number]; 05-12-1933   Admit date: 06/30/2023 Date of Consult: 06/30/2023  Primary Care Provider: [name], MD Primary Cardiologist: Dina Rich, MD  History of Present Illness:   Mr. Rehbein is an 89 y.o. male presenting to the [facility] ER after a fall at home.  He is here with two family members including a granddaughter who is his healthcare power of attorney.  He states that he "tripped" while he was walking outside to check his well, was on the ground for a "few hours" as he could not get up, tried to crawl to his car.  His wife is in an assisted living facility, and when he did not show up for lunch she contacted his sister who went to check on him and found him on the ground.  His granddaughter mentions that he has been fatigued with activity, has had other falls as well.  He does not admit to losing consciousness, but was too weak to get up when he fell.  He is noted to be in second-degree type II heart block by telemetry and ECG.  Cardiovascular history is reviewed below.  He follows with Dr. Wyline Mood and was last seen in May.  He has a history of symptomatic bradycardia necessitating discontinuation of beta-blocker several years ago.  Was seen by EP in 2018 with plan for watchful waiting as he had an episode of 2:1 heart block by cardiac monitor only during early morning hours.  He has abrasions on the back of his head, his elbows, and his knees which are currently dressed.  Head CT reports no acute intracranial abnormality.  ROS:  Pertinent review in history of present illness.  Hearing loss.  No exertional chest pain.  No orthopnea or PND.  Past Medical History:  Diagnosis Date   Arteriosclerotic cardiovascular disease (ASCVD)    CABG surgery in 1990. MI in 1986; coronary angiography in 2003-critical LAD with patent LIMA; total obstruction of the RCA with patent RIMA; low normal EF.   Atrial flutter, paroxysmal  (HCC)    asymptomatic; onset in 2010; 4:1 AVB with low-dose metoprolol; moderate left      atrial enlargement and mild left ventricular hypertrophy with normal ejection fraction by echocardiography   Benign prostatic hypertrophy    Bifascicular block    EKG 07/27/22: SB with SA, first-degree AV block, RBBB, left posterior fascicular block (bifascicular block) .   Bradycardia    a. requiring cessation of beta blocker 12/2016.   Chronic anticoagulation    Current use of long term anticoagulation    Remains on chronic Coumadin   Hyperlipidemia    Lipid profile in 09/2010:196, 84, 54, 125   Mobitz type 1 second degree atrioventricular block 12/17/2016   Nephrolithiasis    Polymyalgia rheumatica (HCC)     Past Surgical History:  Procedure Laterality Date   COLONOSCOPY  08/21/01   RMR: Left-sided diverticulum.  Remainder of colonic mucosa appeared normal   COLONOSCOPY N/A 07/31/2013   Procedure: COLONOSCOPY;  Surgeon: West Bali, MD;  Location: [facility];  Service: Endoscopy;  Laterality: N/A;  10:30   CORONARY ARTERY BYPASS GRAFT  1990   LUMBAR DISC SURGERY     TRANSURETHRAL RESECTION OF BLADDER TUMOR N/A 03/01/2017   Procedure: TRANSURETHRAL RESECTION OF BLADDER TUMOR (TURBT);  Surgeon: Marcine Matar, MD;  Location: [facility];  Service: Urology;  Laterality: N/A;   TRANSURETHRAL RESECTION OF BLADDER TUMOR N/A 04/12/2017   Procedure: REPEAT  TRANSURETHRAL RESECTION OF BLADDER TUMOR (TURBT);  Surgeon: Marcine Matar, MD;  Location: [facility];  Service: Urology;  Laterality: N/A;  1 HR [phone] [id number] [id number]     Outpatient Medications: No current facility-administered medications on file prior to encounter.   Current Outpatient Medications on File Prior to Encounter  Medication Sig Dispense Refill   escitalopram (LEXAPRO) 5 MG tablet Take 5 mg by mouth every morning.     acetaminophen (TYLENOL) 500 MG tablet Take 1,000 mg by mouth every 8 (eight) hours as needed for  mild pain.     atorvastatin (LIPITOR) 80 MG tablet Take 1 tablet by mouth once daily 90 tablet 3   diclofenac Sodium (VOLTAREN) 1 % GEL Apply 4 g topically 4 (four) times daily. 150 g 0   tamsulosin (FLOMAX) 0.4 MG CAPS capsule Take 0.4 mg by mouth daily after supper.     warfarin (COUMADIN) 5 MG tablet TAKE 1 & 1/2 (ONE & ONE-HALF) TABLETS BY MOUTH ONCE DAILY AT  6  PM  OR  AS  DIRECTED  BY  COUMADIN  CLINIC 45 tablet 5      Allergies:   No Known Allergies  Social History:   Social History   Tobacco Use   Smoking status: Former    Current packs/day: 0.00    Average packs/day: 1.5 packs/day for 25.0 years (37.5 ttl pk-yrs)    Types: Cigarettes    Start date: 10/07/1957    Quit date: 10/07/1982    Years since quitting: 40.7    Passive exposure: Never   Smokeless tobacco: Never  Substance Use Topics   Alcohol use: Yes    Comment: 1 bottle of wine weekly; occasional beer    Family History:   The patient's family history includes Coronary artery disease in his brother; Heart attack in his mother; Pneumonia in his brother. There is no history of Colon cancer or Colon polyps.  Physical Exam/Data:   Vitals:   06/30/23 1515 06/30/23 1530 06/30/23 1545 06/30/23 1615  BP: (!) 146/53 (!) 146/50 (!) 155/55 (!) 162/64  Pulse: (!) 36 (!) 38 (!) 36   Resp: 14 14 16 14   Temp:      TempSrc:      SpO2: 91% 95% 99%   Weight:      Height:       No intake or output data in the 24 hours ending 06/30/23 1633 Filed Weights   06/30/23 1259  Weight: 68 kg   Body mass index is 23.49 kg/m.   Gen: Patient is in no distress. HEENT: Conjunctiva and lids normal, oropharynx clear. Neck: Supple, no elevated JVP or carotid bruits. Lungs: Clear to auscultation, nonlabored breathing at rest. Cardiac: Regular rate and rhythm, no S3, 1/6 systolic murmur, no pericardial rub. Abdomen: Soft, nontender, bowel sounds present. Extremities: No pitting edema, dressings on bilateral elbows and knees. Skin:  Warm and dry. Musculoskeletal: No kyphosis. Neuropsychiatric: Alert and oriented x3, affect grossly appropriate.  EKG:  An ECG dated 06/30/2023 was personally reviewed today and demonstrated:  Sinus rhythm with right bundle branch block, left posterior fascicular block, and second-degree type II heart block (best seen in lead V1).  Telemetry:  I personally reviewed telemetry which shows sinus rhythm with second-degree type II heart block.  Relevant CV Studies:  No recent cardiac testing for review.  Laboratory Data:  Chemistry Recent Labs  Lab 06/30/23 1411  NA 140  K 3.6  CL 107  CO2 25  GLUCOSE 117*  BUN 24*  CREATININE 0.58*  CALCIUM 8.9  GFRNONAA >60  ANIONGAP 8     Hematology Recent Labs  Lab 06/30/23 1411  WBC 14.1*  RBC 4.17*  HGB 12.8*  HCT 40.4  MCV 96.9  MCH 30.7  MCHC 31.7  RDW 15.9*  PLT 219    Lipid Panel     Component Value Date/Time   CHOL 124 12/25/2011 0800   TRIG 40 12/25/2011 0800   TRIG 84 09/21/2010 0000   HDL 49 12/25/2011 0800   CHOLHDL 2.5 12/25/2011 0800   VLDL 8 12/25/2011 0800   LDLCALC 67 12/25/2011 0800   LDLCALC 125 09/21/2010 0000    Radiology/Studies:  DG Lumbar Spine Complete  Result Date: 06/30/2023 CLINICAL DATA:  Pain after fall. EXAM: LUMBAR SPINE - COMPLETE 4+ VIEW COMPARISON:  None Available. FINDINGS: Five non-rib-bearing lumbar vertebra. No traumatic subluxation. Trace grade 1 degenerative anterolisthesis of L4 on L5. Normal vertebral body heights. The posterior elements are intact. Moderate degenerative disc disease diffusely with disc space narrowing and spurring. Prominent lower lumbar facet hypertrophy. Advanced atherosclerosis. IMPRESSION: 1. No fracture or subluxation of the lumbar spine. 2. Moderate diffuse degenerative disc disease and lower lumbar facet hypertrophy. Electronically Signed   By: Narda Rutherford M.D.   On: 06/30/2023 15:13   DG Knee Complete 4 Views Right  Result Date:  06/30/2023 CLINICAL DATA:  Pain after fall EXAM: RIGHT KNEE - COMPLETE 4+ VIEW COMPARISON:  None Available. FINDINGS: No evidence of fracture, dislocation, or joint effusion. Minor patellofemoral spurring. Chondrocalcinosis. Small quadriceps tendon enthesophyte. There are vascular calcifications. Soft tissues are otherwise unremarkable. IMPRESSION: 1. No fracture or subluxation of the right knee. 2. Chondrocalcinosis. Electronically Signed   By: Narda Rutherford M.D.   On: 06/30/2023 15:12   DG Knee Complete 4 Views Left  Result Date: 06/30/2023 CLINICAL DATA:  Pain after fall. EXAM: LEFT KNEE - COMPLETE 4+ VIEW COMPARISON:  None Available. FINDINGS: No evidence of fracture, dislocation, or joint effusion. Minor patellofemoral degenerative change. There are vascular calcifications. Soft tissues are otherwise unremarkable. IMPRESSION: 1. No fracture or subluxation of the left knee. 2. Minor patellofemoral degenerative change. Electronically Signed   By: Narda Rutherford M.D.   On: 06/30/2023 15:11   CT Head Wo Contrast  Result Date: 06/30/2023 CLINICAL DATA:  Head trauma, GCS=15, loss of consciousness (LOC) (Ped 0-17y); Neck trauma (Age >= 65y) EXAM: CT HEAD WITHOUT CONTRAST CT CERVICAL SPINE WITHOUT CONTRAST TECHNIQUE: Multidetector CT imaging of the head and cervical spine was performed following the standard protocol without intravenous contrast. Multiplanar CT image reconstructions of the cervical spine were also generated. RADIATION DOSE REDUCTION: This exam was performed according to the departmental dose-optimization program which includes automated exposure control, adjustment of the mA and/or kV according to patient size and/or use of iterative reconstruction technique. COMPARISON:  None Available. FINDINGS: CT HEAD FINDINGS Brain: No hemorrhage. No hydrocephalus. No extra-axial fluid collection. No CT evidence of an acute cortical infarct. No mass effect. No mass lesion. Vascular: No hyperdense  vessel or unexpected calcification. Skull: Normal. Negative for fracture or focal lesion. Sinuses/Orbits: No middle ear or mastoid effusion. Paranasal sinuses clear. Bilateral lens replacement. Orbits are otherwise unremarkable. Other: None. CT CERVICAL SPINE FINDINGS Alignment: Normal. Skull base and vertebrae: No acute fracture. No primary bone lesion or focal pathologic process. Soft tissues and spinal canal: No prevertebral fluid or swelling. No visible canal hematoma. Disc levels: No evidence of high-grade spinal canal stenosis. There is likely severe left-sided neural foraminal stenosis at  C5-C6 secondary to an eccentric left disc bulge. Upper chest: Negative. Other: 5 cm left thyroid nodule. Recommend further evaluation with a dedicated thyroid ultrasound, if not previously performed. IMPRESSION: 1. No acute intracranial abnormality. 2. No acute fracture or traumatic subluxation of the cervical spine. 3. Likely severe left-sided neural foraminal stenosis at C5-C6. 4. 5 cm left thyroid nodule. Recommend further evaluation with a dedicated thyroid ultrasound, if not previously performed. Electronically Signed   By: Lorenza Cambridge M.D.   On: 06/30/2023 14:17   CT Cervical Spine Wo Contrast  Result Date: 06/30/2023 CLINICAL DATA:  Head trauma, GCS=15, loss of consciousness (LOC) (Ped 0-17y); Neck trauma (Age >= 65y) EXAM: CT HEAD WITHOUT CONTRAST CT CERVICAL SPINE WITHOUT CONTRAST TECHNIQUE: Multidetector CT imaging of the head and cervical spine was performed following the standard protocol without intravenous contrast. Multiplanar CT image reconstructions of the cervical spine were also generated. RADIATION DOSE REDUCTION: This exam was performed according to the departmental dose-optimization program which includes automated exposure control, adjustment of the mA and/or kV according to patient size and/or use of iterative reconstruction technique. COMPARISON:  None Available. FINDINGS: CT HEAD FINDINGS  Brain: No hemorrhage. No hydrocephalus. No extra-axial fluid collection. No CT evidence of an acute cortical infarct. No mass effect. No mass lesion. Vascular: No hyperdense vessel or unexpected calcification. Skull: Normal. Negative for fracture or focal lesion. Sinuses/Orbits: No middle ear or mastoid effusion. Paranasal sinuses clear. Bilateral lens replacement. Orbits are otherwise unremarkable. Other: None. CT CERVICAL SPINE FINDINGS Alignment: Normal. Skull base and vertebrae: No acute fracture. No primary bone lesion or focal pathologic process. Soft tissues and spinal canal: No prevertebral fluid or swelling. No visible canal hematoma. Disc levels: No evidence of high-grade spinal canal stenosis. There is likely severe left-sided neural foraminal stenosis at C5-C6 secondary to an eccentric left disc bulge. Upper chest: Negative. Other: 5 cm left thyroid nodule. Recommend further evaluation with a dedicated thyroid ultrasound, if not previously performed. IMPRESSION: 1. No acute intracranial abnormality. 2. No acute fracture or traumatic subluxation of the cervical spine. 3. Likely severe left-sided neural foraminal stenosis at C5-C6. 4. 5 cm left thyroid nodule. Recommend further evaluation with a dedicated thyroid ultrasound, if not previously performed. Electronically Signed   By: Lorenza Cambridge M.D.   On: 06/30/2023 14:17    Assessment and Plan:   1.  Second-degree type II heart block with symptomatic bradycardia.  Patient presented after fall, not entirely clear that he had frank syncope, but too weak to get up and maintaining heart rate in the high 30s to low 40s with clear evidence of conduction system disease by ECG.  Patient's granddaughter indicates that he has had fatigue recently as well.  Prior history of symptomatic bradycardia with discontinuation of beta-blocker back in 2018 as discussed above.  2.  History of paroxysmal atrial flutter, on Coumadin for stroke prophylaxis with CHA2DS2-VASc  score of 3.  He has declined to transition to [facility] per chart review.  3.  Multivessel CAD status post CABG in 1990.  Cardiac catheterization in 2003 demonstrated patent LIMA to LAD and patent RIMA to RCA.  No recent assessment of LVEF.  He does not report any angina at this time.  4.  Mixed hyperlipidemia, on Lipitor.  Discussed situation with patient and family members present.  Plan is for him to be admitted to the hospitalist team at [facility] and pursue EP consultation regarding pacemaker implantation.  Plan to keep him n.p.o. after midnight.  He needs  an echocardiogram as part of his workup.  For questions or updates, please contact [facility] Please consult [email or web address] for contact info under   Signed, Nona Dell, MD  06/30/2023 4:33 PM

## 2023-06-30 NOTE — H&P (Signed)
TRH H&P   Patient Demographics:    Shane [name], is a 89 y.o. male  MRN: [id number]   DOB - [birth year]  Admit Date - 06/30/2023  Outpatient Primary MD for the patient is [name], MD  Referring MD/NP/PA: PA Upstill    Patient coming from: home  Chief Complaint  Patient presents with   Fall      HPI:    Shane [name]  is a 89 y.o. male, past medical history of coronary artery disease/CABG, a flutter, on warfarin, declined DOAC in the past, history of bradycardia, for which his beta-blocker has been discontinued in 2018, chronic anticoagulation on warfarin, hyperlipidemia. -Patient presents to ED secondary to fall, reports he fell in the yard this morning, reports he fell forward, into his elbows, and knees, he does not remember head trauma, no headache, no nausea, no vomiting, no loss of consciousness, daughter at bedside report he was found by his neighbors 2 hours after he fell, still in [place], being unable to get up, he is on warfarin,Family reports he is fatigued with activity, he is with known history of bradycardia in the past, with his beta-blockers has been discontinued -In ED imaging with no evidence of acute trauma or fracture, but he was found to be with significant bradycardia, cardiology were consulted, commendation to add transferred to [facility] for consideration of this maker, Dr. Diona Browner discussed with EP team who will evaluate tomorrow, patient on warfarin, INR 3.3, he received 5 mg of p.o. vitamin K in anticipation for possible procedure tomorrow.  Review of systems:     A full 10 point Review of Systems was done, except as stated above, all other Review of Systems were negative.   With Past History of the following :    Past Medical History:  Diagnosis Date   Arteriosclerotic cardiovascular disease (ASCVD)    CABG surgery in 1990. MI in 1986;  coronary angiography in 2003-critical LAD with patent LIMA; total obstruction of the RCA with patent RIMA; low normal EF.   Atrial flutter, paroxysmal (HCC)    asymptomatic; onset in 2010; 4:1 AVB with low-dose metoprolol; moderate left      atrial enlargement and mild left ventricular hypertrophy with normal ejection fraction by echocardiography   Benign prostatic hypertrophy    Bifascicular block    EKG 07/27/22: SB with SA, first-degree AV block, RBBB, left posterior fascicular block (bifascicular block) .   Bradycardia    a. requiring cessation of beta blocker 12/2016.   Chronic anticoagulation    Current use of long term anticoagulation    Remains on chronic Coumadin   Hyperlipidemia    Lipid profile in 09/2010:196, 84, 54, 125   Mobitz type 1 second degree atrioventricular block 12/17/2016   Nephrolithiasis    Polymyalgia rheumatica (HCC)       Past Surgical History:  Procedure Laterality Date   COLONOSCOPY  08/21/01   RMR: Left-sided diverticulum.  Remainder of colonic mucosa appeared normal   COLONOSCOPY N/A 07/31/2013   Procedure: COLONOSCOPY;  Surgeon: West Bali, MD;  Location: [facility];  Service: Endoscopy;  Laterality: N/A;  10:30   CORONARY ARTERY BYPASS GRAFT  1990   LUMBAR DISC SURGERY     TRANSURETHRAL RESECTION OF BLADDER TUMOR N/A 03/01/2017   Procedure: TRANSURETHRAL RESECTION OF BLADDER TUMOR (TURBT);  Surgeon: Marcine Matar, MD;  Location: [facility];  Service: Urology;  Laterality: N/A;   TRANSURETHRAL RESECTION OF BLADDER TUMOR N/A 04/12/2017   Procedure: REPEAT TRANSURETHRAL RESECTION OF BLADDER TUMOR (TURBT);  Surgeon: Marcine Matar, MD;  Location: [facility];  Service: Urology;  Laterality: N/A;  1 HR [id number] [employer] [id number]      Social History:     Social History   Tobacco Use   Smoking status: Former    Current packs/day: 0.00    Average packs/day: 1.5 packs/day for 25.0 years (37.5 ttl pk-yrs)    Types: Cigarettes    Start  date: 10/07/1957    Quit date: 10/07/1982    Years since quitting: 40.7    Passive exposure: Never   Smokeless tobacco: Never  Substance Use Topics   Alcohol use: Yes    Comment: 1 bottle of wine weekly; occasional beer       Family History :     Family History  Problem Relation Age of Onset   Heart attack Mother    Coronary artery disease Brother        CABG + pacemaker   Pneumonia Brother        infant death   Colon cancer Neg Hx    Colon polyps Neg Hx       Home Medications:   Prior to Admission medications   Medication Sig Start Date End Date Taking? Authorizing Provider  acetaminophen (TYLENOL) 500 MG tablet Take 1,000 mg by mouth every 8 (eight) hours as needed for mild pain.   Yes [provider]  atorvastatin (LIPITOR) 80 MG tablet Take 1 tablet by mouth once daily Patient taking differently: Take 80 mg by mouth every evening. 08/09/22  Yes Sharlene Dory, NP  escitalopram (LEXAPRO) 5 MG tablet Take 5 mg by mouth every evening. 06/13/23  Yes [provider]  tamsulosin (FLOMAX) 0.4 MG CAPS capsule Take 0.4 mg by mouth daily after supper.   Yes [provider]  warfarin (COUMADIN) 5 MG tablet TAKE 1 & 1/2 (ONE & ONE-HALF) TABLETS BY MOUTH ONCE DAILY AT  6  PM  OR  AS  DIRECTED  BY  COUMADIN  CLINIC Patient taking differently: Take 5-7.5 mg by mouth one time only at 4 PM. Take 1.5 every day except Thursdays and Sundays which are just 1 tablet. 03/01/23  Yes BranchDorothe Pea, MD     Allergies:    No Known Allergies   Physical Exam:   Vitals  Blood pressure (!) 150/58, pulse (!) 36, temperature 97.8 F (36.6 C), temperature source Oral, resp. rate 16, height 5\' 7"  (1.702 m), weight 68 kg, SpO2 99%.   1. General Early male, laying in bed, no apparent distress, hard of hearing  2. Normal affect and insight, Not Suicidal or Homicidal, Awake Alert, Oriented X 3.  3. No F.N deficits, ALL C.Nerves Intact, Strength 5/5 all 4 extremities,  Sensation intact all 4 extremities, Plantars down going.  4. Ears and Eyes appear Normal, Conjunctivae clear, PERRLA. Moist Oral Mucosa.  5. Supple Neck,  No JVD, No cervical lymphadenopathy appriciated, No Carotid Bruits.  6. Symmetrical Chest wall movement, Good air movement bilaterally, CTAB.  7.  Bradycardic, No Gallops, Rubs or Murmurs, No Parasternal Heave.  8. Positive Bowel Sounds, Abdomen Soft, No tenderness, No organomegaly appriciated,No rebound -guarding or rigidity.  9.  No Cyanosis, Normal Skin Turgor,  multiple bruises, bilateral knees, bandage, and in the right shoulder area  10. Good muscle tone,  joints appear normal , no effusions, Normal ROM.    Data Review:    CBC Recent Labs  Lab 06/30/23 1411  WBC 14.1*  HGB 12.8*  HCT 40.4  PLT 219  MCV 96.9  MCH 30.7  MCHC 31.7  RDW 15.9*  LYMPHSABS 0.6*  MONOABS 0.9  EOSABS 0.0  BASOSABS 0.0   ------------------------------------------------------------------------------------------------------------------  Chemistries  Recent Labs  Lab 06/30/23 1411  NA 140  K 3.6  CL 107  CO2 25  GLUCOSE 117*  BUN 24*  CREATININE 0.58*  CALCIUM 8.9   ------------------------------------------------------------------------------------------------------------------ estimated creatinine clearance is 59.7 mL/min (A) (by C-G formula based on SCr of 0.58 mg/dL (L)). ------------------------------------------------------------------------------------------------------------------ No results for input(s): "TSH", "T4TOTAL", "T3FREE", "THYROIDAB" in the last 72 hours.  Invalid input(s): "FREET3"  Coagulation profile Recent Labs  Lab 06/30/23 1411  INR 3.3*   ------------------------------------------------------------------------------------------------------------------- No results for input(s): "DDIMER" in the last 72  hours. -------------------------------------------------------------------------------------------------------------------  Cardiac Enzymes No results for input(s): "CKMB", "TROPONINI", "MYOGLOBIN" in the last 168 hours.  Invalid input(s): "CK" ------------------------------------------------------------------------------------------------------------------ No results found for: "BNP"   ---------------------------------------------------------------------------------------------------------------  Urinalysis    Component Value Date/Time   COLORURINE YELLOW 06/30/2023 1557   APPEARANCEUR HAZY (A) 06/30/2023 1557   APPEARANCEUR Clear 12/21/2022 1456   LABSPEC 1.026 06/30/2023 1557   PHURINE 5.0 06/30/2023 1557   GLUCOSEU NEGATIVE 06/30/2023 1557   HGBUR LARGE (A) 06/30/2023 1557   BILIRUBINUR NEGATIVE 06/30/2023 1557   BILIRUBINUR Negative 12/21/2022 1456   KETONESUR 5 (A) 06/30/2023 1557   PROTEINUR 100 (A) 06/30/2023 1557   NITRITE NEGATIVE 06/30/2023 1557   LEUKOCYTESUR NEGATIVE 06/30/2023 1557    ----------------------------------------------------------------------------------------------------------------   Imaging Results:    DG Lumbar Spine Complete  Result Date: 06/30/2023 CLINICAL DATA:  Pain after fall. EXAM: LUMBAR SPINE - COMPLETE 4+ VIEW COMPARISON:  None Available. FINDINGS: Five non-rib-bearing lumbar vertebra. No traumatic subluxation. Trace grade 1 degenerative anterolisthesis of L4 on L5. Normal vertebral body heights. The posterior elements are intact. Moderate degenerative disc disease diffusely with disc space narrowing and spurring. Prominent lower lumbar facet hypertrophy. Advanced atherosclerosis. IMPRESSION: 1. No fracture or subluxation of the lumbar spine. 2. Moderate diffuse degenerative disc disease and lower lumbar facet hypertrophy. Electronically Signed   By: Narda Rutherford M.D.   On: 06/30/2023 15:13   DG Knee Complete 4 Views Right  Result  Date: 06/30/2023 CLINICAL DATA:  Pain after fall EXAM: RIGHT KNEE - COMPLETE 4+ VIEW COMPARISON:  None Available. FINDINGS: No evidence of fracture, dislocation, or joint effusion. Minor patellofemoral spurring. Chondrocalcinosis. Small quadriceps tendon enthesophyte. There are vascular calcifications. Soft tissues are otherwise unremarkable. IMPRESSION: 1. No fracture or subluxation of the right knee. 2. Chondrocalcinosis. Electronically Signed   By: Narda Rutherford M.D.   On: 06/30/2023 15:12   DG Knee Complete 4 Views Left  Result Date: 06/30/2023 CLINICAL DATA:  Pain after fall. EXAM: LEFT KNEE - COMPLETE 4+ VIEW COMPARISON:  None Available. FINDINGS: No evidence of fracture, dislocation, or joint effusion. Minor patellofemoral degenerative change. There are vascular calcifications. Soft tissues are otherwise unremarkable.  IMPRESSION: 1. No fracture or subluxation of the left knee. 2. Minor patellofemoral degenerative change. Electronically Signed   By: Narda Rutherford M.D.   On: 06/30/2023 15:11   CT Head Wo Contrast  Result Date: 06/30/2023 CLINICAL DATA:  Head trauma, GCS=15, loss of consciousness (LOC) (Ped 0-17y); Neck trauma (Age >= 65y) EXAM: CT HEAD WITHOUT CONTRAST CT CERVICAL SPINE WITHOUT CONTRAST TECHNIQUE: Multidetector CT imaging of the head and cervical spine was performed following the standard protocol without intravenous contrast. Multiplanar CT image reconstructions of the cervical spine were also generated. RADIATION DOSE REDUCTION: This exam was performed according to the departmental dose-optimization program which includes automated exposure control, adjustment of the mA and/or kV according to patient size and/or use of iterative reconstruction technique. COMPARISON:  None Available. FINDINGS: CT HEAD FINDINGS Brain: No hemorrhage. No hydrocephalus. No extra-axial fluid collection. No CT evidence of an acute cortical infarct. No mass effect. No mass lesion. Vascular: No  hyperdense vessel or unexpected calcification. Skull: Normal. Negative for fracture or focal lesion. Sinuses/Orbits: No middle ear or mastoid effusion. Paranasal sinuses clear. Bilateral lens replacement. Orbits are otherwise unremarkable. Other: None. CT CERVICAL SPINE FINDINGS Alignment: Normal. Skull base and vertebrae: No acute fracture. No primary bone lesion or focal pathologic process. Soft tissues and spinal canal: No prevertebral fluid or swelling. No visible canal hematoma. Disc levels: No evidence of high-grade spinal canal stenosis. There is likely severe left-sided neural foraminal stenosis at C5-C6 secondary to an eccentric left disc bulge. Upper chest: Negative. Other: 5 cm left thyroid nodule. Recommend further evaluation with a dedicated thyroid ultrasound, if not previously performed. IMPRESSION: 1. No acute intracranial abnormality. 2. No acute fracture or traumatic subluxation of the cervical spine. 3. Likely severe left-sided neural foraminal stenosis at C5-C6. 4. 5 cm left thyroid nodule. Recommend further evaluation with a dedicated thyroid ultrasound, if not previously performed. Electronically Signed   By: Lorenza Cambridge M.D.   On: 06/30/2023 14:17   CT Cervical Spine Wo Contrast  Result Date: 06/30/2023 CLINICAL DATA:  Head trauma, GCS=15, loss of consciousness (LOC) (Ped 0-17y); Neck trauma (Age >= 65y) EXAM: CT HEAD WITHOUT CONTRAST CT CERVICAL SPINE WITHOUT CONTRAST TECHNIQUE: Multidetector CT imaging of the head and cervical spine was performed following the standard protocol without intravenous contrast. Multiplanar CT image reconstructions of the cervical spine were also generated. RADIATION DOSE REDUCTION: This exam was performed according to the departmental dose-optimization program which includes automated exposure control, adjustment of the mA and/or kV according to patient size and/or use of iterative reconstruction technique. COMPARISON:  None Available. FINDINGS: CT HEAD  FINDINGS Brain: No hemorrhage. No hydrocephalus. No extra-axial fluid collection. No CT evidence of an acute cortical infarct. No mass effect. No mass lesion. Vascular: No hyperdense vessel or unexpected calcification. Skull: Normal. Negative for fracture or focal lesion. Sinuses/Orbits: No middle ear or mastoid effusion. Paranasal sinuses clear. Bilateral lens replacement. Orbits are otherwise unremarkable. Other: None. CT CERVICAL SPINE FINDINGS Alignment: Normal. Skull base and vertebrae: No acute fracture. No primary bone lesion or focal pathologic process. Soft tissues and spinal canal: No prevertebral fluid or swelling. No visible canal hematoma. Disc levels: No evidence of high-grade spinal canal stenosis. There is likely severe left-sided neural foraminal stenosis at C5-C6 secondary to an eccentric left disc bulge. Upper chest: Negative. Other: 5 cm left thyroid nodule. Recommend further evaluation with a dedicated thyroid ultrasound, if not previously performed. IMPRESSION: 1. No acute intracranial abnormality. 2. No acute fracture or traumatic subluxation of  the cervical spine. 3. Likely severe left-sided neural foraminal stenosis at C5-C6. 4. 5 cm left thyroid nodule. Recommend further evaluation with a dedicated thyroid ultrasound, if not previously performed. Electronically Signed   By: Lorenza Cambridge M.D.   On: 06/30/2023 14:17     EKG Vent. rate 37 BPM PR interval 292 ms QRS duration 167 ms QT/QTcB 673/528 ms P-R-T axes 23 90 59 Sinus bradycardia Prolonged PR interval RBBB and LPFB  Assessment & Plan:    Principal Problem:   Bradycardia Active Problems:   Polymyalgia rheumatica (HCC)   BPH (benign prostatic hyperplasia)   Arteriosclerotic cardiovascular disease (ASCVD)   Hyperlipidemia   Chronic anticoagulation   Atrial flutter, paroxysmal (HCC)   Borderline hypertension  Symptomatic  Bradycardia with second-degree type II heart block - he presents with fall, he denis syncope  or pre Syncope - not on AV blocking agents - will check TSH - heart rate in the high 30s to low 40s  - cards input greatly appreciated , recommendation transferred to [facility] for evaluation by EP service for possible pacemaker, Dr. Diona Browner discussed with EP, they will evaluate tomorrow, patient will be n.p.o. after midnight in case he is a candidate for pacemaker insertion, INR 3.3, will give 5 mg of p.o. vitamin K t. -Is currently asymptomatic, so no indication for atropine or dopamine, but if he becomes symptomatic then will initiate.  Fall -Very likely in the setting of his bradycardia, x-ray with no evidence of fractures, will consult PT when bradycardia resolves and it is safe to do so.   Paroxysmal atrial flutter -On warfarin for anticoagulation, see discussion below  Chronic anticoagulation  - INR 3.3, will give 5 mg of p.o. vitamin K anticipation for procedure tomorrow, repeat INR in AM.   CAD -Status post CABG -Denies any chest pain or shortness of breath -2D echo is pending to evaluate for EF  Hyperlipidemia -Continue with statin    DVT Prophylaxis >> on warfarin  AM Labs Ordered, also please review Full Orders  Family Communication: Admission, patients condition and plan of care including tests being ordered have been discussed with the patient and granddaughter who indicate understanding and agree with the plan and Code Status.  Code Status full code  Likely DC to home  Consults called: Cardiology  Admission status: Observation  Time spent in minutes : 70 minutes   Huey Bienenstock M.D on 06/30/2023 at 7:40 PM   [facility] - Office  [phone]

## 2023-06-30 NOTE — ED Triage Notes (Signed)
Fall around 0900 Walking on gravel in yard  Landed on knees and elbows  Denies hitting head Pt is on coumadin  Noted wrapped skin tears to arms and legs.  Pt moving all extremities freely in triage.  Denies pain  Denies numbness in hands and feet.   Noted bruising to that back of the head in triage.

## 2023-06-30 NOTE — ED Notes (Signed)
Pt states his pill got stuck in his throat. Pt instructed not to eat or drink anything until a swallow study can be completed.  Provider made aware. Pt's VSS, denies SOB.

## 2023-06-30 NOTE — ED Notes (Signed)
ED Provider at bedside. 

## 2023-06-30 NOTE — ED Provider Notes (Signed)
[place] EMERGENCY DEPARTMENT AT [facility] Provider Note   CSN: [id number] Arrival date & time: 06/30/23  1248     History  Chief Complaint  Patient presents with   Shane [name] is a 89 y.o. male.  Patient fell in the yard this morning, reporting that he fell forward onto elbows and knees. He did not remember hitting his head.  No headache, nausea, vomiting or LOC. Daughter at bedside states he was found by a neighbor about 2 hours after he fell still in the yard being unable to get up. Anticoagulated on Coumadin.   The history is provided by the patient and a relative. No language interpreter was used.  Fall       Home Medications Prior to Admission medications   Medication Sig Start Date End Date Taking? Authorizing Provider  acetaminophen (TYLENOL) 500 MG tablet Take 1,000 mg by mouth every 8 (eight) hours as needed for mild pain.    [provider]  atorvastatin (LIPITOR) 80 MG tablet Take 1 tablet by mouth once daily 08/09/22   Sharlene Dory, NP  diclofenac Sodium (VOLTAREN) 1 % GEL Apply 4 g topically 4 (four) times daily. 11/21/22   Leath-Warren, Sadie Haber, NP  tamsulosin (FLOMAX) 0.4 MG CAPS capsule Take 0.4 mg by mouth daily after supper.    [provider]  warfarin (COUMADIN) 5 MG tablet TAKE 1 & 1/2 (ONE & ONE-HALF) TABLETS BY MOUTH ONCE DAILY AT  6  PM  OR  AS  DIRECTED  BY  COUMADIN  CLINIC 03/01/23   Antoine Poche, MD      Allergies    Patient has no known allergies.    Review of Systems   Review of Systems  Physical Exam Updated Vital Signs BP (!) 146/50   Pulse (!) 38   Temp 97.8 F (36.6 C) (Oral)   Resp 14   Ht 5\' 7"  (1.702 m)   Wt 68 kg   SpO2 95%   BMI 23.49 kg/m  Physical Exam Vitals and nursing note reviewed.  Constitutional:      General: He is not in acute distress.    Appearance: He is not ill-appearing.  HENT:     Head:     Comments: Bruising noted to parietal scalp. No bleeding.   Eyes:     Pupils: Pupils are equal, round, and reactive to light.  Neck:     Comments: No midline cervical tenderness. Full pain-free ROM of the neck.  Cardiovascular:     Rate and Rhythm: Normal rate.     Heart sounds: No murmur heard. Pulmonary:     Effort: Pulmonary effort is normal.  Abdominal:     Palpations: Abdomen is soft.     Tenderness: There is no abdominal tenderness.  Musculoskeletal:     Cervical back: Normal range of motion and neck supple.     Comments: Swelling to bilateral knees anteriorly with bruising. Skin tears to proximal lower legs bilaterally. There are skin tears to bilateral proximal forearms as well. No bony tenderness of the extremities. FROM without limitation. No other spinal tenderness, however appears uncomfortable with lower back flexion.   Neurological:     General: No focal deficit present.     Mental Status: He is alert and oriented to person, place, and time.     Sensory: No sensory deficit.     ED Results / Procedures / Treatments   Labs (all labs ordered are listed,  but only abnormal results are displayed) Labs Reviewed  PROTIME-INR - Abnormal; Notable for the following components:      Result Value   Prothrombin Time 33.5 (*)    INR 3.3 (*)    All other components within normal limits  CBC WITH DIFFERENTIAL/PLATELET - Abnormal; Notable for the following components:   WBC 14.1 (*)    RBC 4.17 (*)    Hemoglobin 12.8 (*)    RDW 15.9 (*)    Neutro Abs 12.6 (*)    Lymphs Abs 0.6 (*)    All other components within normal limits  BASIC METABOLIC PANEL - Abnormal; Notable for the following components:   Glucose, Bld 117 (*)    BUN 24 (*)    Creatinine, Ser 0.58 (*)    All other components within normal limits  URINALYSIS, ROUTINE W REFLEX MICROSCOPIC   Results for orders placed or performed during the hospital encounter of 06/30/23 (from the past 24 hour(s))  Protime-INR     Status: Abnormal   Collection Time: 06/30/23  2:11 PM   Result Value Ref Range   Prothrombin Time 33.5 (H) 11.4 - 15.2 seconds   INR 3.3 (H) 0.8 - 1.2  CBC with Differential     Status: Abnormal   Collection Time: 06/30/23  2:11 PM  Result Value Ref Range   WBC 14.1 (H) 4.0 - 10.5 K/uL   RBC 4.17 (L) 4.22 - 5.81 MIL/uL   Hemoglobin 12.8 (L) 13.0 - 17.0 g/dL   HCT 53.6 64.4 - 03.4 %   MCV 96.9 80.0 - 100.0 fL   MCH 30.7 26.0 - 34.0 pg   MCHC 31.7 30.0 - 36.0 g/dL   RDW 74.2 (H) 59.5 - 63.8 %   Platelets 219 150 - 400 K/uL   nRBC 0.0 0.0 - 0.2 %   Neutrophils Relative % 89 %   Neutro Abs 12.6 (H) 1.7 - 7.7 K/uL   Lymphocytes Relative 4 %   Lymphs Abs 0.6 (L) 0.7 - 4.0 K/uL   Monocytes Relative 6 %   Monocytes Absolute 0.9 0.1 - 1.0 K/uL   Eosinophils Relative 0 %   Eosinophils Absolute 0.0 0.0 - 0.5 K/uL   Basophils Relative 0 %   Basophils Absolute 0.0 0.0 - 0.1 K/uL   Immature Granulocytes 1 %   Abs Immature Granulocytes 0.07 0.00 - 0.07 K/uL  Basic metabolic panel     Status: Abnormal   Collection Time: 06/30/23  2:11 PM  Result Value Ref Range   Sodium 140 135 - 145 mmol/L   Potassium 3.6 3.5 - 5.1 mmol/L   Chloride 107 98 - 111 mmol/L   CO2 25 22 - 32 mmol/L   Glucose, Bld 117 (H) 70 - 99 mg/dL   BUN 24 (H) 8 - 23 mg/dL   Creatinine, Ser 7.56 (L) 0.61 - 1.24 mg/dL   Calcium 8.9 8.9 - 43.3 mg/dL   GFR, Estimated >29 >51 mL/min   Anion gap 8 5 - 15     EKG EKG Interpretation Date/Time:  Thursday June 30 2023 14:41:29 EDT Ventricular Rate:  37 PR Interval:  292 QRS Duration:  167 QT Interval:  673 QTC Calculation: 528 R Axis:   90  Text Interpretation: Sinus bradycardia Prolonged PR interval RBBB and LPFB Confirmed by Meridee Score [phone]) on 06/30/2023 3:06:49 PM  Radiology DG Lumbar Spine Complete  Result Date: 06/30/2023 CLINICAL DATA:  Pain after fall. EXAM: LUMBAR SPINE - COMPLETE 4+ VIEW COMPARISON:  None Available.  FINDINGS: Five non-rib-bearing lumbar vertebra. No traumatic subluxation. Trace  grade 1 degenerative anterolisthesis of L4 on L5. Normal vertebral body heights. The posterior elements are intact. Moderate degenerative disc disease diffusely with disc space narrowing and spurring. Prominent lower lumbar facet hypertrophy. Advanced atherosclerosis. IMPRESSION: 1. No fracture or subluxation of the lumbar spine. 2. Moderate diffuse degenerative disc disease and lower lumbar facet hypertrophy. Electronically Signed   By: Narda Rutherford M.D.   On: 06/30/2023 15:13   DG Knee Complete 4 Views Right  Result Date: 06/30/2023 CLINICAL DATA:  Pain after fall EXAM: RIGHT KNEE - COMPLETE 4+ VIEW COMPARISON:  None Available. FINDINGS: No evidence of fracture, dislocation, or joint effusion. Minor patellofemoral spurring. Chondrocalcinosis. Small quadriceps tendon enthesophyte. There are vascular calcifications. Soft tissues are otherwise unremarkable. IMPRESSION: 1. No fracture or subluxation of the right knee. 2. Chondrocalcinosis. Electronically Signed   By: Narda Rutherford M.D.   On: 06/30/2023 15:12   DG Knee Complete 4 Views Left  Result Date: 06/30/2023 CLINICAL DATA:  Pain after fall. EXAM: LEFT KNEE - COMPLETE 4+ VIEW COMPARISON:  None Available. FINDINGS: No evidence of fracture, dislocation, or joint effusion. Minor patellofemoral degenerative change. There are vascular calcifications. Soft tissues are otherwise unremarkable. IMPRESSION: 1. No fracture or subluxation of the left knee. 2. Minor patellofemoral degenerative change. Electronically Signed   By: Narda Rutherford M.D.   On: 06/30/2023 15:11   CT Head Wo Contrast  Result Date: 06/30/2023 CLINICAL DATA:  Head trauma, GCS=15, loss of consciousness (LOC) (Ped 0-17y); Neck trauma (Age >= 65y) EXAM: CT HEAD WITHOUT CONTRAST CT CERVICAL SPINE WITHOUT CONTRAST TECHNIQUE: Multidetector CT imaging of the head and cervical spine was performed following the standard protocol without intravenous contrast. Multiplanar CT image  reconstructions of the cervical spine were also generated. RADIATION DOSE REDUCTION: This exam was performed according to the departmental dose-optimization program which includes automated exposure control, adjustment of the mA and/or kV according to patient size and/or use of iterative reconstruction technique. COMPARISON:  None Available. FINDINGS: CT HEAD FINDINGS Brain: No hemorrhage. No hydrocephalus. No extra-axial fluid collection. No CT evidence of an acute cortical infarct. No mass effect. No mass lesion. Vascular: No hyperdense vessel or unexpected calcification. Skull: Normal. Negative for fracture or focal lesion. Sinuses/Orbits: No middle ear or mastoid effusion. Paranasal sinuses clear. Bilateral lens replacement. Orbits are otherwise unremarkable. Other: None. CT CERVICAL SPINE FINDINGS Alignment: Normal. Skull base and vertebrae: No acute fracture. No primary bone lesion or focal pathologic process. Soft tissues and spinal canal: No prevertebral fluid or swelling. No visible canal hematoma. Disc levels: No evidence of high-grade spinal canal stenosis. There is likely severe left-sided neural foraminal stenosis at C5-C6 secondary to an eccentric left disc bulge. Upper chest: Negative. Other: 5 cm left thyroid nodule. Recommend further evaluation with a dedicated thyroid ultrasound, if not previously performed. IMPRESSION: 1. No acute intracranial abnormality. 2. No acute fracture or traumatic subluxation of the cervical spine. 3. Likely severe left-sided neural foraminal stenosis at C5-C6. 4. 5 cm left thyroid nodule. Recommend further evaluation with a dedicated thyroid ultrasound, if not previously performed. Electronically Signed   By: Lorenza Cambridge M.D.   On: 06/30/2023 14:17   CT Cervical Spine Wo Contrast  Result Date: 06/30/2023 CLINICAL DATA:  Head trauma, GCS=15, loss of consciousness (LOC) (Ped 0-17y); Neck trauma (Age >= 65y) EXAM: CT HEAD WITHOUT CONTRAST CT CERVICAL SPINE WITHOUT  CONTRAST TECHNIQUE: Multidetector CT imaging of the head and cervical spine was performed following  the standard protocol without intravenous contrast. Multiplanar CT image reconstructions of the cervical spine were also generated. RADIATION DOSE REDUCTION: This exam was performed according to the departmental dose-optimization program which includes automated exposure control, adjustment of the mA and/or kV according to patient size and/or use of iterative reconstruction technique. COMPARISON:  None Available. FINDINGS: CT HEAD FINDINGS Brain: No hemorrhage. No hydrocephalus. No extra-axial fluid collection. No CT evidence of an acute cortical infarct. No mass effect. No mass lesion. Vascular: No hyperdense vessel or unexpected calcification. Skull: Normal. Negative for fracture or focal lesion. Sinuses/Orbits: No middle ear or mastoid effusion. Paranasal sinuses clear. Bilateral lens replacement. Orbits are otherwise unremarkable. Other: None. CT CERVICAL SPINE FINDINGS Alignment: Normal. Skull base and vertebrae: No acute fracture. No primary bone lesion or focal pathologic process. Soft tissues and spinal canal: No prevertebral fluid or swelling. No visible canal hematoma. Disc levels: No evidence of high-grade spinal canal stenosis. There is likely severe left-sided neural foraminal stenosis at C5-C6 secondary to an eccentric left disc bulge. Upper chest: Negative. Other: 5 cm left thyroid nodule. Recommend further evaluation with a dedicated thyroid ultrasound, if not previously performed. IMPRESSION: 1. No acute intracranial abnormality. 2. No acute fracture or traumatic subluxation of the cervical spine. 3. Likely severe left-sided neural foraminal stenosis at C5-C6. 4. 5 cm left thyroid nodule. Recommend further evaluation with a dedicated thyroid ultrasound, if not previously performed. Electronically Signed   By: Lorenza Cambridge M.D.   On: 06/30/2023 14:17    Procedures Procedures    Medications  Ordered in ED Medications - No data to display  ED Course/ Medical Decision Making/ A&P                                 Medical Decision Making This patient presents to the ED for concern of fall, unable to get up, coagulopathy, this involves an extensive number of treatment options, and is a complaint that carries with it a high risk of complications and morbidity.  The differential diagnosis includes head injury including bleed, skull fracture, extremity fracture, arrhythmia, infection, stroke   Co morbidities that complicate the patient evaluation  Coagulopathy on Coumadin, h/o atrial flutter, CAD, polymyalgia rheumatica,    Additional history obtained:  Additional history obtained from Daughter at bedside External records from outside source obtained and reviewed including 5/22 office visit with [facility] noting chronic bradycardia, stopped beta blockers in 2018 for same, history of Mobitz type 1 followed by EP.    Lab Tests:  I Ordered, and personally interpreted labs.  The pertinent results include:  WBC 14, likely due to fall. No fever, symptoms of infection. UA pending. INR slightly high for a-fib/flutter at 3.3.     Imaging Studies ordered:  I ordered imaging studies including Head CT, neck CT, bil knee, lumbar  radiologist interpretation - all negative.    Cardiac Monitoring: / EKG:  The patient was maintained on a cardiac monitor.  I personally viewed and interpreted the cardiac monitored which showed an underlying rhythm of: Significant bradycardia of 36. 2:1 AV block seen previously.    Consultations Obtained:  I requested consultation with the Dr. Diona Browner, cardiology,  and discussed lab and imaging findings as well as pertinent plan - they recommend: admit to [facility] with hospitalist. Cardiology to consult.    Problem List / ED Course / Critical interventions / Medication management  Imaging obtained - all negative  for internal  injury Medication for bradycardia not indicated currently. Cardiology to offer consultation   Social Determinants of Health:  Caring for wife who is in facilty [facility])   Test / Admission - Considered:  Admit to [facility] per cardiology. Hospitalist paged.     Amount and/or Complexity of Data Reviewed Labs: ordered. Radiology: ordered. ECG/medicine tests: ordered.           Final Clinical Impression(s) / ED Diagnoses Final diagnoses:  Symptomatic bradycardia  Syncope, unspecified syncope type    Rx / DC Orders ED Discharge Orders     None         Danne Harbor 06/30/23 1557    Cathren Laine, MD 07/04/23 [phone]

## 2023-07-01 ENCOUNTER — Observation Stay (HOSPITAL_COMMUNITY): Payer: Medicare HMO

## 2023-07-01 DIAGNOSIS — I480 Paroxysmal atrial fibrillation: Secondary | ICD-10-CM | POA: Diagnosis present

## 2023-07-01 DIAGNOSIS — M353 Polymyalgia rheumatica: Secondary | ICD-10-CM | POA: Diagnosis present

## 2023-07-01 DIAGNOSIS — R001 Bradycardia, unspecified: Secondary | ICD-10-CM | POA: Diagnosis present

## 2023-07-01 DIAGNOSIS — N138 Other obstructive and reflux uropathy: Secondary | ICD-10-CM

## 2023-07-01 DIAGNOSIS — Z7901 Long term (current) use of anticoagulants: Secondary | ICD-10-CM | POA: Diagnosis not present

## 2023-07-01 DIAGNOSIS — N401 Enlarged prostate with lower urinary tract symptoms: Secondary | ICD-10-CM | POA: Diagnosis not present

## 2023-07-01 DIAGNOSIS — R030 Elevated blood-pressure reading, without diagnosis of hypertension: Secondary | ICD-10-CM | POA: Diagnosis not present

## 2023-07-01 DIAGNOSIS — I5032 Chronic diastolic (congestive) heart failure: Secondary | ICD-10-CM | POA: Diagnosis present

## 2023-07-01 DIAGNOSIS — I110 Hypertensive heart disease with heart failure: Secondary | ICD-10-CM | POA: Diagnosis present

## 2023-07-01 DIAGNOSIS — E041 Nontoxic single thyroid nodule: Secondary | ICD-10-CM | POA: Diagnosis present

## 2023-07-01 DIAGNOSIS — Y92007 Garden or yard of unspecified non-institutional (private) residence as the place of occurrence of the external cause: Secondary | ICD-10-CM | POA: Diagnosis not present

## 2023-07-01 DIAGNOSIS — M4802 Spinal stenosis, cervical region: Secondary | ICD-10-CM | POA: Diagnosis present

## 2023-07-01 DIAGNOSIS — Z8249 Family history of ischemic heart disease and other diseases of the circulatory system: Secondary | ICD-10-CM | POA: Diagnosis not present

## 2023-07-01 DIAGNOSIS — I441 Atrioventricular block, second degree: Secondary | ICD-10-CM | POA: Diagnosis present

## 2023-07-01 DIAGNOSIS — I2510 Atherosclerotic heart disease of native coronary artery without angina pectoris: Secondary | ICD-10-CM

## 2023-07-01 DIAGNOSIS — E782 Mixed hyperlipidemia: Secondary | ICD-10-CM | POA: Diagnosis present

## 2023-07-01 DIAGNOSIS — Z951 Presence of aortocoronary bypass graft: Secondary | ICD-10-CM | POA: Diagnosis not present

## 2023-07-01 DIAGNOSIS — Z8551 Personal history of malignant neoplasm of bladder: Secondary | ICD-10-CM | POA: Diagnosis not present

## 2023-07-01 DIAGNOSIS — R5383 Other fatigue: Secondary | ICD-10-CM | POA: Diagnosis present

## 2023-07-01 DIAGNOSIS — I4892 Unspecified atrial flutter: Secondary | ICD-10-CM | POA: Diagnosis present

## 2023-07-01 DIAGNOSIS — I252 Old myocardial infarction: Secondary | ICD-10-CM | POA: Diagnosis not present

## 2023-07-01 DIAGNOSIS — W010XXA Fall on same level from slipping, tripping and stumbling without subsequent striking against object, initial encounter: Secondary | ICD-10-CM | POA: Diagnosis present

## 2023-07-01 DIAGNOSIS — I495 Sick sinus syndrome: Secondary | ICD-10-CM | POA: Diagnosis present

## 2023-07-01 DIAGNOSIS — Z79899 Other long term (current) drug therapy: Secondary | ICD-10-CM | POA: Diagnosis not present

## 2023-07-01 DIAGNOSIS — R7401 Elevation of levels of liver transaminase levels: Secondary | ICD-10-CM | POA: Diagnosis present

## 2023-07-01 DIAGNOSIS — E7849 Other hyperlipidemia: Secondary | ICD-10-CM

## 2023-07-01 DIAGNOSIS — R55 Syncope and collapse: Secondary | ICD-10-CM

## 2023-07-01 DIAGNOSIS — Z87891 Personal history of nicotine dependence: Secondary | ICD-10-CM | POA: Diagnosis not present

## 2023-07-01 DIAGNOSIS — H9190 Unspecified hearing loss, unspecified ear: Secondary | ICD-10-CM | POA: Diagnosis present

## 2023-07-01 DIAGNOSIS — F32A Depression, unspecified: Secondary | ICD-10-CM | POA: Diagnosis present

## 2023-07-01 DIAGNOSIS — N400 Enlarged prostate without lower urinary tract symptoms: Secondary | ICD-10-CM | POA: Diagnosis present

## 2023-07-01 LAB — ECHOCARDIOGRAM COMPLETE
AR max vel: 2.89 cm2
AV Area VTI: 2.62 cm2
AV Area mean vel: 2.62 cm2
AV Mean grad: 4 mm[Hg]
AV Peak grad: 7.6 mm[Hg]
Ao pk vel: 1.38 m/s
Area-P 1/2: 2.96 cm2
Height: 67 in
S' Lateral: 3 cm
Weight: 2400 [oz_av]

## 2023-07-01 LAB — CBC
HCT: 41.1 % (ref 39.0–52.0)
Hemoglobin: 12.6 g/dL — ABNORMAL LOW (ref 13.0–17.0)
MCH: 29.5 pg (ref 26.0–34.0)
MCHC: 30.7 g/dL (ref 30.0–36.0)
MCV: 96.3 fL (ref 80.0–100.0)
Platelets: 200 10*3/uL (ref 150–400)
RBC: 4.27 MIL/uL (ref 4.22–5.81)
RDW: 15.9 % — ABNORMAL HIGH (ref 11.5–15.5)
WBC: 8.3 10*3/uL (ref 4.0–10.5)
nRBC: 0 % (ref 0.0–0.2)

## 2023-07-01 LAB — BASIC METABOLIC PANEL
Anion gap: 8 (ref 5–15)
BUN: 21 mg/dL (ref 8–23)
CO2: 24 mmol/L (ref 22–32)
Calcium: 8.7 mg/dL — ABNORMAL LOW (ref 8.9–10.3)
Chloride: 107 mmol/L (ref 98–111)
Creatinine, Ser: 0.56 mg/dL — ABNORMAL LOW (ref 0.61–1.24)
GFR, Estimated: >60 mL/min (ref >60)
Glucose, Bld: 99 mg/dL (ref 70–99)
Potassium: 3.8 mmol/L (ref 3.5–5.1)
Sodium: 139 mmol/L (ref 135–145)

## 2023-07-01 LAB — PROTIME-INR
INR: 2.3 — ABNORMAL HIGH (ref 0.8–1.2)
Prothrombin Time: 25.3 s — ABNORMAL HIGH (ref 11.4–15.2)

## 2023-07-01 LAB — TROPONIN I (HIGH SENSITIVITY)
Troponin I (High Sensitivity): 95 ng/L — ABNORMAL HIGH (ref <18)
Troponin I (High Sensitivity): 96 ng/L — ABNORMAL HIGH (ref <18)

## 2023-07-01 NOTE — ED Notes (Signed)
Called report, nurse was on lunch break, nurse will call back as soon as she returns.

## 2023-07-01 NOTE — Progress Notes (Signed)
Returned calls x 2 to provided number @ [phone] with no responses. Received patient and report from [facility]. Patient A/O x3; Very hard on hearing. No family present at the time of admission/transfer. Awaiting Provider for additional orders.

## 2023-07-01 NOTE — ED Notes (Signed)
Family updated as to patient's status. Spoke with granddaughter Barbarann Ehlers on pt's status. States she will be here to visit after getting her children to school.  Please call with any updates if pt leaves prior to her arrival.

## 2023-07-01 NOTE — Plan of Care (Signed)
  Problem: Education: Goal: Knowledge of General Education information will improve Description: Including pain rating scale, medication(s)/side effects and non-pharmacologic comfort measures Outcome: Progressing   Problem: Clinical Measurements: Goal: Diagnostic test results will improve Outcome: Progressing Goal: Respiratory complications will improve Outcome: Progressing Goal: Cardiovascular complication will be avoided Outcome: Progressing   Problem: Pain Management: Goal: General experience of comfort will improve Outcome: Progressing

## 2023-07-01 NOTE — Progress Notes (Signed)
Progress Note   Patient: Shane [name] DOB: Nov 12, 1932 DOA: 06/30/2023     0 DOS: the patient was seen and examined on 07/01/2023   Brief hospital admission course: As per H&P written by Dr. Randol Kern on 06/30/2023. Shane [name]  is a 89 y.o. male, past medical history of coronary artery disease/CABG, a flutter, on warfarin, declined DOAC in the past, history of bradycardia, for which his beta-blocker has been discontinued in 2018, chronic anticoagulation on warfarin, hyperlipidemia. -Patient presents to ED secondary to fall, reports he fell in the yard this morning, reports he fell forward, into his elbows, and knees, he does not remember head trauma, no headache, no nausea, no vomiting, no loss of consciousness, daughter at bedside report he was found by his neighbors 2 hours after he fell, still in [place], being unable to get up, he is on warfarin,Family reports he is fatigued with activity, he is with known history of bradycardia in the past, with his beta-blockers has been discontinued -In ED imaging with no evidence of acute trauma or fracture, but he was found to be with significant bradycardia, cardiology were consulted, commendation to add transferred to [facility] for consideration of this maker, Dr. Diona Browner discussed with EP team who will evaluate tomorrow, patient on warfarin, INR 3.3, he received 5 mg of p.o. vitamin K in anticipation for possible procedure tomorrow.  Assessment and Plan: 1-symptomatic bradycardia with second-degree type II heart block and underlying history of atrial flutter on chronic warfarin. -Patient qualified for tachybradycardia syndrome. -While at rest vital signs on today's evaluation within normal limits and patient asymptomatic. -Following cardiology service recommendation patient will be kept n.p.o. for now with plan to transfer to [facility] for evaluation by EP and possible pacemaker implantation. -Continue supportive care and  pursuit echocardiogram evaluation.  2-fall -In the setting of tachybradycardia syndrome with second-degree AV block. -Continue telemetry monitoring -Physical therapy evaluation once cardiology work up/intervention completed.  3-paroxysmal atrial flutter -Now with what appears to be tachybradycardia syndrome -Patient on warfarin for anticoagulation as outpatient -Currently discontinued on hold in anticipation for pacemaker implantation.  4-chronic anticoagulation -CHADSVASC score 3 -Continue to hold warfarin at the moment in anticipation for procedure. -INR was 3.3 at time of admission -5 mg vitamin K provided at the end of admission and current INR 2.3  5-history of coronary artery disease -No chest pain, no nausea or vomiting -Patient is status post CABG -Continue risk factor modification -Continue telemetry monitoring and follow echo results  6-mixed hyperlipidemia -continue lipitor  7-history of BPH -Continue Flomax.  8-HTN -Continue as needed hydralazine.  9-hx of depression -Stable mood currently appreciated -Continue the use of Lexapro.  Subjective:  Afebrile, no chest pain, no nausea, no vomiting, no dizziness or lightheadedness.  Patient expressed no palpitations.  Physical Exam: Vitals:   07/01/23 0330 07/01/23 0430 07/01/23 0500 07/01/23 0553  BP: (!) 135/55 (!) 132/54 132/60   Pulse: (!) 42 (!) 41 (!) 48   Resp: 17 15 15    Temp:    98.3 F (36.8 C)  TempSrc:    Oral  SpO2: 93% 95% 95%   Weight:      Height:       General exam: Alert, awake, oriented x 3; no chest pain, no nausea, no vomiting, no shortness of breath, no palpitations. Respiratory system: Clear to auscultation. Respiratory effort normal.  Good saturation on room air. Cardiovascular system: Rate controlled; no rubs, no gallops, positive for murmur.  No JVD. Gastrointestinal  system: Abdomen is nondistended, soft and nontender. No organomegaly or masses felt. Normal bowel sounds  heard. Central nervous system: Moving 4 limbs spontaneously.  No focal neurological deficits. Extremities: No cyanosis or clubbing. Skin: No petechiae. Psychiatry: Judgement and insight appear normal. Mood & affect appropriate.   Data Reviewed: Pro time-INR: 25.3 and 2.3 respectively NWG:NFAO 8.3, hemoglobin 12.6 and platelet count 200K Basic metabolic panel: Sodium 139, potassium 3.8, chloride 107, bicarb 24, BUN 21, glucose 99, creatinine 0.6 and GFR >60  Family Communication: No family at bedside.  Disposition: Status is: Observation The patient will require care spanning > 2 midnights and should be moved to inpatient because: Continue treatment and management for type II second-degree AV block.   Planned Discharge Destination: Home   Time spent: 50 minutes  Author: Vassie Loll, MD 07/01/2023 7:28 AM  For on call review [email or web address].

## 2023-07-01 NOTE — Progress Notes (Signed)
SLP Cancellation Note  Patient Details Name: Shane [name] MRN: [id number] DOB: 10-29-1932   Cancelled treatment:       Reason Eval/Treat Not Completed: SLP screened. Per RN, pt previously passed [place] swallow screen and is not having difficulty on regular diet. No needs identified, will sign off. Please re-consult if needs arise.    Gwynneth Aliment, M.A., CF-SLP Speech Language Pathology, Acute Rehabilitation Services  Secure Chat preferred [phone]  07/01/2023, 3:51 PM

## 2023-07-01 NOTE — Progress Notes (Addendum)
Patient Name: Shane [name] Date of Encounter: 07/01/2023 [facility] Cardiologist: Dina Rich, MD   Interval Summary  .    No chest pain or palpitations. Denies any dizziness or presyncope. Had been NPO for possible PPM placement today, but in talking with EP providers at [facility], his INR needs to be < 2.0 and the schedule would also not allow for this to be today. Echo also pending prior to his procedure and scheduled for today.   Vital Signs .    Vitals:   07/01/23 0500 07/01/23 0553 07/01/23 0715 07/01/23 0845  BP: 132/60  137/71 137/73  Pulse: (!) 48  68 75  Resp: 15  18 18   Temp:  98.3 F (36.8 C)    TempSrc:  Oral    SpO2: 95%  92% 93%  Weight:      Height:        Intake/Output Summary (Last 24 hours) at 07/01/2023 0932 Last data filed at 07/01/2023 0815 Gross per 24 hour  Intake --  Output 575 ml  Net -575 ml      06/30/2023   12:59 PM 01/26/2023    9:57 AM 12/21/2022    2:56 PM  Last 3 Weights  Weight (lbs) 150 lb 165 lb 152 lb  Weight (kg) 68.04 kg 74.844 kg 68.947 kg      Telemetry/ECG    2nd degree Type 2 Heart Block overnight with HR in the 40's. Currently in NSR with HR in the 70's with occasional escape beats.  - Personally Reviewed  Physical Exam .    GEN: Elderly male appearing in no acute distress. Dressings in place along arms.   Neck: No JVD Cardiac: RRR, 2/6 systolic murmur along RUSB.  Respiratory: Clear to auscultation bilaterally. GI: Soft, nontender, non-distended  MS: No pitting edema  Assessment & Plan .     1. 2nd Degree Type 2 Heart Block - Presented after a fall and is unclear if he had actual syncope at that time but reported significant weakness. He was predominantly in second-degree type II heart block overnight with heart rate in the 40's but it appears he is currently in sinus rhythm with heart rate in the 70's with occasional escape beats. INR was at 3.3 yesterday and he did receive 5 mg of Vitamin K  yesterday evening with INR at 2.3 today (Coumadin held). Confirmed with EP today and they want INR < 2.0 for PPM placement. Echocardiogram also pending. EP evaluation over the weekend and likely PPM placement on Monday. No indication for temp wire at this time. BP remains stable and was at 137/73 on most recent check.   2. CAD - He is s/p CABG in 1990 with cath in 2003 showing patent LIMA-LAD and RIMA-RCA. Denies any recent anginal symptoms. Repeat echo pending for reassessment of EF and wall motion. Will also allow for further evaluation of his murmur. Continue statin. Not on ASA prior to admission due to being on Coumadin.   3. Paroxysmal Atrial Fibrillation - Maintaining sinus rhythm this admission. Was not on AV nodal blocking agents prior to admission given bradycardia in the past.  - Coumadin on hold for likely PPM placement. Will review with Dr. Diona Browner in regards to possible bridging with Heparin.   4. HLD - He has been continued on Atorvastatin 80mg  daily.   For questions or updates, please contact [facility] Please consult [email or web address] for contact info under       Signed, Lennart Pall  Iran Ouch, PA-C    Attending note:  Chart reviewed, no interval events.  Agree with above assessment by Ms. Strader PA-C.  Patient awaits transfer to [facility].  EP already contacted about patient and plan.  Ideally INR needs to be less than 2.0, echocardiogram also pending.  Could be that pacemaker will not be able to be placed until Monday.  EP is rounding at [facility] all weekend however.  He has had some sinus rhythm with heart rate in the 70s this morning, however was in second-degree type II heart block all night.  Telemetry reviewed.  Coumadin held and also given dose of vitamin K yesterday as he was supratherapeutic.  Today's INR is 2.3.  Echocardiogram ordered and pending.  Patient is on list for transfer once bed available at [facility].  EP already aware as  discussed above.  May have diet at this point.  Jonelle Sidle, M.D., F.A.C.C.

## 2023-07-01 NOTE — Consult Note (Signed)
ELECTROPHYSIOLOGY CONSULT NOTE    Patient ID: Shane [name] MRN: [id number], DOB/AGE: 87-Sep-1934 87 y.o.  Admit date: 06/30/2023 Date of Consult: 07/01/2023  Primary Physician: [name], MD Primary Cardiologist: Dina Rich, MD  Electrophysiologist: New  Referring Provider: Dr. Diona Browner  Patient Profile: Shane [name] is a 89 y.o. male with a history of CAD s/p CABG, AFL/fib, sinus bradycardia, bifascicular block, HLD, HTN, depression, and HLD who is being seen today for the evaluation of Second degree AV block / Symptomatic bradycardia at the request of Dr. Diona Browner.  HPI:  Shane [name] is a 89 y.o. male with history as above.   Previously saw Dr. Ladona Ridgel years ago for Mobitz 1 on monitor.   Pt presented to [facility] after fall at home. He states he tripped and was on the ground for a few hours and couldn't get up. When he didn't show up to eat lunch with his wife in her assisted living, she contacted his sister who went over and found him.   His grandaughter states he has had increased fatigue as of late.   Pt denies LOC, states he was just too weak to get back up.  Noted to be in 2:1 AV block on arrival.   BB have previously been stopped in setting of bradycardia.   Admitted for evaluation and monitoring while his INR trends down for consideration of Pacemaker. Transferred to [facility] for EP evaluation.   Currently, he is asymptomatic, lying in bed. Has been in Mobitz 1 and NSR now since arriving to [facility]. He does report worsening fatigue as of late.  Of note, he is VERY hard of hearing. He is happy to proceed with pacemaker if it will bring him some symptomatic relief.  Labs Potassium3.8 (10/25 3664)   Creatinine, ser  0.56* (10/25 0511) PLT  200 (10/25 0511) HGB  12.6* (10/25 0511) WBC 8.3 (10/25 0511)  .    Past Medical History:  Diagnosis Date   Arteriosclerotic cardiovascular disease (ASCVD)    CABG surgery in 1990. MI in 1986; coronary angiography in 2003-critical  LAD with patent LIMA; total obstruction of the RCA with patent RIMA; low normal EF.   Atrial flutter, paroxysmal (HCC)    asymptomatic; onset in 2010; 4:1 AVB with low-dose metoprolol; moderate left      atrial enlargement and mild left ventricular hypertrophy with normal ejection fraction by echocardiography   Benign prostatic hypertrophy    Bifascicular block    EKG 07/27/22: SB with SA, first-degree AV block, RBBB, left posterior fascicular block (bifascicular block) .   Bradycardia    a. requiring cessation of beta blocker 12/2016.   Chronic anticoagulation    Current use of long term anticoagulation    Remains on chronic Coumadin   Hyperlipidemia    Lipid profile in 09/2010:196, 84, 54, 125   Mobitz type 1 second degree atrioventricular block 12/17/2016   Nephrolithiasis    Polymyalgia rheumatica (HCC)      Surgical History:  Past Surgical History:  Procedure Laterality Date   COLONOSCOPY  08/21/01   RMR: Left-sided diverticulum.  Remainder of colonic mucosa appeared normal   COLONOSCOPY N/A 07/31/2013   Procedure: COLONOSCOPY;  Surgeon: West Bali, MD;  Location: [facility];  Service: Endoscopy;  Laterality: N/A;  10:30   CORONARY ARTERY BYPASS GRAFT  1990   LUMBAR DISC SURGERY     TRANSURETHRAL RESECTION OF BLADDER TUMOR N/A 03/01/2017   Procedure: TRANSURETHRAL RESECTION OF BLADDER TUMOR (TURBT);  Surgeon:  Marcine Matar, MD;  Location: [facility]S;  Service: Urology;  Laterality: N/A;   TRANSURETHRAL RESECTION OF BLADDER TUMOR N/A 04/12/2017   Procedure: REPEAT TRANSURETHRAL RESECTION OF BLADDER TUMOR (TURBT);  Surgeon: Marcine Matar, MD;  Location: [facility];  Service: Urology;  Laterality: N/A;  1 HR [phone] [employer] MEDICARE-[id number]     Medications Prior to Admission  Medication Sig Dispense Refill Last Dose   acetaminophen (TYLENOL) 500 MG tablet Take 1,000 mg by mouth every 8 (eight) hours as needed for mild pain.   06/29/2023   atorvastatin (LIPITOR) 80 MG  tablet Take 1 tablet by mouth once daily (Patient taking differently: Take 80 mg by mouth every evening.) 90 tablet 3 06/29/2023   escitalopram (LEXAPRO) 5 MG tablet Take 5 mg by mouth every evening.   06/29/2023   tamsulosin (FLOMAX) 0.4 MG CAPS capsule Take 0.4 mg by mouth daily after supper.   06/29/2023   warfarin (COUMADIN) 5 MG tablet TAKE 1 & 1/2 (ONE & ONE-HALF) TABLETS BY MOUTH ONCE DAILY AT  6  PM  OR  AS  DIRECTED  BY  COUMADIN  CLINIC (Patient taking differently: Take 5-7.5 mg by mouth one time only at 4 PM. Take 1.5 every day except Thursdays and Sundays which are just 1 tablet.) 45 tablet 5 06/29/2023 at 1800    Inpatient Medications:   atorvastatin  80 mg Oral Daily   escitalopram  5 mg Oral QPM   tamsulosin  0.4 mg Oral QPC supper    Allergies: No Known Allergies  Family History  Problem Relation Age of Onset   Heart attack Mother    Coronary artery disease Brother        CABG + pacemaker   Pneumonia Brother        infant death   Colon cancer Neg Hx    Colon polyps Neg Hx      Physical Exam: Vitals:   07/01/23 1155 07/01/23 1200 07/01/23 1315 07/01/23 1457  BP:  128/67 135/63 (!) 142/74  Pulse:  61 64   Resp:  18 19 17  Temp: 98.3 F (36.8 C)   98.1 F (36.7 C)  TempSrc: Oral   Oral  SpO2:  94% 95% 97%  Weight:    70.5 kg  Height:    5\' 7" (1.702 m)    GEN- NAD, A&O x 3, normal affect HEENT: Normocephalic, atraumatic Lungs- CTAB, Normal effort.  Heart- Regular rate and rhythm, No M/G/R.  GI- Soft, NT, ND.  Extremities- No clubbing, cyanosis, or edema   Radiology/Studies: ECHOCARDIOGRAM COMPLETE  Result Date: 07/01/2023    ECHOCARDIOGRAM REPORT   Patient Name:   Shane [name] Date of Exam: 07/01/2023 Medical Rec #:  [id number]      Height:       67.0 in Accession #:    [id number]     Weight:       150.0 lb Date of Birth:  05/15/1933     BSA:          1.790 m Patient Age:    89 years       BP:           141/66 mmHg Patient Gender: M              HR:            60  bpm. [facility] Procedure: 2D Echo, Cardiac Doppler and Color Doppler Indications:    CAD Native Vessel I25.10  History:  Patient has no prior history of Echocardiogram examinations.                 CAD, Prior CABG; Arrythmias:Atrial Flutter.  Sonographer:    Darlys Gales Referring Phys: [phone] CARLOS MADERA IMPRESSIONS  1. Left ventricular ejection fraction, by estimation, is 55 to 60%. The left ventricle has normal function. The left ventricle has no regional wall motion abnormalities. There is moderate asymmetric left ventricular hypertrophy of the basal segment. Left ventricular diastolic parameters are consistent with Grade I diastolic dysfunction (impaired relaxation).  2. Right ventricular systolic function is normal. The right ventricular size is normal. Tricuspid regurgitation signal is inadequate for assessing PA pressure.  3. Left atrial size was moderately dilated.  4. The mitral valve is grossly normal. Trivial mitral valve regurgitation.  5. The aortic valve is tricuspid with restricted noncoronary cusp. There is mild calcification of the aortic valve. Aortic valve regurgitation is not visualized. Aortic valve sclerosis/calcification is present, without any evidence of aortic stenosis. Aortic valve mean gradient measures 4.0 mmHg.  6. The inferior vena cava is normal in size with greater than 50% respiratory variability, suggesting right atrial pressure of 3 mmHg. Comparison(s): No prior Echocardiogram. FINDINGS  Left Ventricle: Left ventricular ejection fraction, by estimation, is 55 to 60%. The left ventricle has normal function. The left ventricle has no regional wall motion abnormalities. The left ventricular internal cavity size was normal in size. There is  moderate asymmetric left ventricular hypertrophy of the basal segment. Left ventricular diastolic parameters are consistent with Grade I diastolic dysfunction (impaired relaxation). Right Ventricle: The right  ventricular size is normal. No increase in right ventricular wall thickness. Right ventricular systolic function is normal. Tricuspid regurgitation signal is inadequate for assessing PA pressure. Left Atrium: Left atrial size was moderately dilated. Right Atrium: Right atrial size was normal in size. Pericardium: There is no evidence of pericardial effusion. Mitral Valve: The mitral valve is grossly normal. Trivial mitral valve regurgitation. Tricuspid Valve: The tricuspid valve is grossly normal. Tricuspid valve regurgitation is mild. Aortic Valve: The aortic valve is tricuspid. There is mild calcification of the aortic valve. There is mild aortic valve annular calcification. Aortic valve regurgitation is not visualized. Aortic valve sclerosis/calcification is present, without any evidence of aortic stenosis. Aortic valve mean gradient measures 4.0 mmHg. Aortic valve peak gradient measures 7.6 mmHg. Aortic valve area, by VTI measures 2.62 cm. Pulmonic Valve: The pulmonic valve was grossly normal. Pulmonic valve regurgitation is trivial. Aorta: The aortic root is normal in size and structure. Venous: The inferior vena cava is normal in size with greater than 50% respiratory variability, suggesting right atrial pressure of 3 mmHg. IAS/Shunts: No atrial level shunt detected by color flow Doppler.  LEFT VENTRICLE PLAX 2D LVIDd:         3.90 cm   Diastology LVIDs:         3.00 cm   LV e' medial:    5.77 cm/s LV PW:         1.10 cm   LV E/e' medial:  11.7 LV IVS:        1.50 cm   LV e' lateral:   6.42 cm/s LVOT diam:     2.00 cm   LV E/e' lateral: 10.5 LV SV:         61 LV SV Index:   34 LVOT Area:     3.14 cm  RIGHT VENTRICLE RV S prime:     10.00 cm/s TAPSE (M-mode): 2.0  cm LEFT ATRIUM             Index        RIGHT ATRIUM           Index LA Vol (A2C):   46.1 ml 25.76 ml/m  RA Area:     18.60 cm LA Vol (A4C):   90.5 ml 50.57 ml/m  RA Volume:   49.60 ml  27.72 ml/m LA Biplane Vol: 65.0 ml 36.32 ml/m  AORTIC VALVE  AV Area (Vmax):    2.89 cm AV Area (Vmean):   2.62 cm AV Area (VTI):     2.62 cm AV Vmax:           138.00 cm/s AV Vmean:          97.600 cm/s AV VTI:            0.233 m AV Peak Grad:      7.6 mmHg AV Mean Grad:      4.0 mmHg LVOT Vmax:         127.00 cm/s LVOT Vmean:        81.400 cm/s LVOT VTI:          0.194 m LVOT/AV VTI ratio: 0.83  AORTA Ao Root diam: 3.30 cm MITRAL VALVE MV Area (PHT): 2.96 cm    SHUNTS MV Decel Time: 256 msec    Systemic VTI:  0.19 m MV E velocity: 67.70 cm/s  Systemic Diam: 2.00 cm MV A velocity: 70.70 cm/s MV E/A ratio:  0.96 Nona Dell MD Electronically signed by Nona Dell MD Signature Date/Time: 07/01/2023/12:03:21 PM    Final    DG Lumbar Spine Complete  Result Date: 06/30/2023 CLINICAL DATA:  Pain after fall. EXAM: LUMBAR SPINE - COMPLETE 4+ VIEW COMPARISON:  None Available. FINDINGS: Five non-rib-bearing lumbar vertebra. No traumatic subluxation. Trace grade 1 degenerative anterolisthesis of L4 on L5. Normal vertebral body heights. The posterior elements are intact. Moderate degenerative disc disease diffusely with disc space narrowing and spurring. Prominent lower lumbar facet hypertrophy. Advanced atherosclerosis. IMPRESSION: 1. No fracture or subluxation of the lumbar spine. 2. Moderate diffuse degenerative disc disease and lower lumbar facet hypertrophy. Electronically Signed   By: Narda Rutherford M.D.   On: 06/30/2023 15:13   DG Knee Complete 4 Views Right  Result Date: 06/30/2023 CLINICAL DATA:  Pain after fall EXAM: RIGHT KNEE - COMPLETE 4+ VIEW COMPARISON:  None Available. FINDINGS: No evidence of fracture, dislocation, or joint effusion. Minor patellofemoral spurring. Chondrocalcinosis. Small quadriceps tendon enthesophyte. There are vascular calcifications. Soft tissues are otherwise unremarkable. IMPRESSION: 1. No fracture or subluxation of the right knee. 2. Chondrocalcinosis. Electronically Signed   By: Narda Rutherford M.D.   On: 06/30/2023  15:12   DG Knee Complete 4 Views Left  Result Date: 06/30/2023 CLINICAL DATA:  Pain after fall. EXAM: LEFT KNEE - COMPLETE 4+ VIEW COMPARISON:  None Available. FINDINGS: No evidence of fracture, dislocation, or joint effusion. Minor patellofemoral degenerative change. There are vascular calcifications. Soft tissues are otherwise unremarkable. IMPRESSION: 1. No fracture or subluxation of the left knee. 2. Minor patellofemoral degenerative change. Electronically Signed   By: Narda Rutherford M.D.   On: 06/30/2023 15:11   CT Head Wo Contrast  Result Date: 06/30/2023 CLINICAL DATA:  Head trauma, GCS=15, loss of consciousness (LOC) (Ped 0-17y); Neck trauma (Age >= 65y) EXAM: CT HEAD WITHOUT CONTRAST CT CERVICAL SPINE WITHOUT CONTRAST TECHNIQUE: Multidetector CT imaging of the head and cervical spine was performed following the standard protocol  without intravenous contrast. Multiplanar CT image reconstructions of the cervical spine were also generated. RADIATION DOSE REDUCTION: This exam was performed according to the departmental dose-optimization program which includes automated exposure control, adjustment of the mA and/or kV according to patient size and/or use of iterative reconstruction technique. COMPARISON:  None Available. FINDINGS: CT HEAD FINDINGS Brain: No hemorrhage. No hydrocephalus. No extra-axial fluid collection. No CT evidence of an acute cortical infarct. No mass effect. No mass lesion. Vascular: No hyperdense vessel or unexpected calcification. Skull: Normal. Negative for fracture or focal lesion. Sinuses/Orbits: No middle ear or mastoid effusion. Paranasal sinuses clear. Bilateral lens replacement. Orbits are otherwise unremarkable. Other: None. CT CERVICAL SPINE FINDINGS Alignment: Normal. Skull base and vertebrae: No acute fracture. No primary bone lesion or focal pathologic process. Soft tissues and spinal canal: No prevertebral fluid or swelling. No visible canal hematoma. Disc levels:  No evidence of high-grade spinal canal stenosis. There is likely severe left-sided neural foraminal stenosis at C5-C6 secondary to an eccentric left disc bulge. Upper chest: Negative. Other: 5 cm left thyroid nodule. Recommend further evaluation with a dedicated thyroid ultrasound, if not previously performed. IMPRESSION: 1. No acute intracranial abnormality. 2. No acute fracture or traumatic subluxation of the cervical spine. 3. Likely severe left-sided neural foraminal stenosis at C5-C6. 4. 5 cm left thyroid nodule. Recommend further evaluation with a dedicated thyroid ultrasound, if not previously performed. Electronically Signed   By: Lorenza Cambridge M.D.   On: 06/30/2023 14:17   CT Cervical Spine Wo Contrast  Result Date: 06/30/2023 CLINICAL DATA:  Head trauma, GCS=15, loss of consciousness (LOC) (Ped 0-17y); Neck trauma (Age >= 65y) EXAM: CT HEAD WITHOUT CONTRAST CT CERVICAL SPINE WITHOUT CONTRAST TECHNIQUE: Multidetector CT imaging of the head and cervical spine was performed following the standard protocol without intravenous contrast. Multiplanar CT image reconstructions of the cervical spine were also generated. RADIATION DOSE REDUCTION: This exam was performed according to the departmental dose-optimization program which includes automated exposure control, adjustment of the mA and/or kV according to patient size and/or use of iterative reconstruction technique. COMPARISON:  None Available. FINDINGS: CT HEAD FINDINGS Brain: No hemorrhage. No hydrocephalus. No extra-axial fluid collection. No CT evidence of an acute cortical infarct. No mass effect. No mass lesion. Vascular: No hyperdense vessel or unexpected calcification. Skull: Normal. Negative for fracture or focal lesion. Sinuses/Orbits: No middle ear or mastoid effusion. Paranasal sinuses clear. Bilateral lens replacement. Orbits are otherwise unremarkable. Other: None. CT CERVICAL SPINE FINDINGS Alignment: Normal. Skull base and vertebrae: No  acute fracture. No primary bone lesion or focal pathologic process. Soft tissues and spinal canal: No prevertebral fluid or swelling. No visible canal hematoma. Disc levels: No evidence of high-grade spinal canal stenosis. There is likely severe left-sided neural foraminal stenosis at C5-C6 secondary to an eccentric left disc bulge. Upper chest: Negative. Other: 5 cm left thyroid nodule. Recommend further evaluation with a dedicated thyroid ultrasound, if not previously performed. IMPRESSION: 1. No acute intracranial abnormality. 2. No acute fracture or traumatic subluxation of the cervical spine. 3. Likely severe left-sided neural foraminal stenosis at C5-C6. 4. 5 cm left thyroid nodule. Recommend further evaluation with a dedicated thyroid ultrasound, if not previously performed. Electronically Signed   By: Lorenza Cambridge M.D.   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  Has also previously had nocturnal 2:1 AV block.  Now with persistent AV block and symptoms.  Goal INR would be ~2.0 for PPM implantation, additionally census did not permit placement today.   With worsening conduction and symptomatic bradycardia, would recommend proceeding with pacing at next available time pending INR.    PAF On coumadin for CHA2DS2VASc of at least 3, historically has declined DOACs by preference He has not had history of CVA, so likely plan would be to resume without bridge after device.   Multivessel CAD s/p CABG 1990 , patent LIMA - LAD and RIMA RCA in 2003 No angina.  LVEF 55-60% 07/01/2023, Grade 1 DD HS troponin has not been  drawn.   Fall Deconditioning BPH HTN Depression Per primary  HOH Must speak very loudly for pt to understand.  States his hearing aids are no help.   Dr. Nelly Laurence has seen. Tentatively plan PPM at next available time; likely Monday. Coumadin on hold to allow INR to trend down.   For questions or updates, please contact [facility] Please consult [email or web address] for contact info under Cardiology/STEMI.  Dustin Flock, PA-C  07/01/2023 3:01 PM

## 2023-07-02 ENCOUNTER — Other Ambulatory Visit: Payer: Medicare HMO

## 2023-07-02 ENCOUNTER — Inpatient Hospital Stay (HOSPITAL_COMMUNITY): Payer: Medicare HMO

## 2023-07-02 DIAGNOSIS — R001 Bradycardia, unspecified: Secondary | ICD-10-CM | POA: Diagnosis not present

## 2023-07-02 LAB — CBC WITH DIFFERENTIAL/PLATELET
Abs Immature Granulocytes: 0.02 10*3/uL (ref 0.00–0.07)
Basophils Absolute: 0 10*3/uL (ref 0.0–0.1)
Basophils Relative: 0 %
Eosinophils Absolute: 0.1 10*3/uL (ref 0.0–0.5)
Eosinophils Relative: 2 %
HCT: 43.5 % (ref 39.0–52.0)
Hemoglobin: 14 g/dL (ref 13.0–17.0)
Immature Granulocytes: 0 %
Lymphocytes Relative: 12 %
Lymphs Abs: 0.9 10*3/uL (ref 0.7–4.0)
MCH: 30.4 pg (ref 26.0–34.0)
MCHC: 32.2 g/dL (ref 30.0–36.0)
MCV: 94.6 fL (ref 80.0–100.0)
Monocytes Absolute: 0.5 10*3/uL (ref 0.1–1.0)
Monocytes Relative: 6 %
Neutro Abs: 6 10*3/uL (ref 1.7–7.7)
Neutrophils Relative %: 80 %
Platelets: 221 10*3/uL (ref 150–400)
RBC: 4.6 MIL/uL (ref 4.22–5.81)
RDW: 15.6 % — ABNORMAL HIGH (ref 11.5–15.5)
WBC: 7.5 10*3/uL (ref 4.0–10.5)
nRBC: 0 % (ref 0.0–0.2)

## 2023-07-02 LAB — COMPREHENSIVE METABOLIC PANEL
ALT: 100 U/L — ABNORMAL HIGH (ref 0–44)
AST: 427 U/L — ABNORMAL HIGH (ref 15–41)
Albumin: 3.5 g/dL (ref 3.5–5.0)
Alkaline Phosphatase: 61 U/L (ref 38–126)
Anion gap: 12 (ref 5–15)
BUN: 18 mg/dL (ref 8–23)
CO2: 21 mmol/L — ABNORMAL LOW (ref 22–32)
Calcium: 9 mg/dL (ref 8.9–10.3)
Chloride: 105 mmol/L (ref 98–111)
Creatinine, Ser: 0.61 mg/dL (ref 0.61–1.24)
GFR, Estimated: >60 mL/min (ref >60)
Glucose, Bld: 198 mg/dL — ABNORMAL HIGH (ref 70–99)
Potassium: 3.8 mmol/L (ref 3.5–5.1)
Sodium: 138 mmol/L (ref 135–145)
Total Bilirubin: 1.5 mg/dL — ABNORMAL HIGH (ref 0.3–1.2)
Total Protein: 6.4 g/dL — ABNORMAL LOW (ref 6.5–8.1)

## 2023-07-02 LAB — PROTIME-INR
INR: 1.1 (ref 0.8–1.2)
Prothrombin Time: 14.5 s (ref 11.4–15.2)

## 2023-07-02 LAB — MAGNESIUM: Magnesium: 2.2 mg/dL (ref 1.7–2.4)

## 2023-07-02 MED ORDER — LORAZEPAM 2 MG/ML IJ SOLN
1.0000 mg | INTRAMUSCULAR | Status: DC | PRN
  Administered 2023-07-02: 1 mg via INTRAVENOUS
  Filled 2023-07-02: qty 1

## 2023-07-02 NOTE — Progress Notes (Addendum)
Pt showing signs of increased confusion, unable to reorient, pt trying to get out of bed, increased paranoia. Pt stated, "It is not right for you and me to be alone in this room, find another follower". Pt was previously A&O x4, pt stated that he is in a hotel room & told staff not to enter his room again. Attempted to notify Julian Reil, DO; see new orders.  Bari Edward, RN

## 2023-07-02 NOTE — Progress Notes (Signed)
Progress Note  Patient Name: Shane [name] Date of Encounter: 07/02/2023  Primary Cardiologist: Dina Rich, MD   Subjective   No chest pain or sob. Had some confusion last night, appears better.  Inpatient Medications    Scheduled Meds:  atorvastatin  80 mg Oral Daily   escitalopram  5 mg Oral QPM   tamsulosin  0.4 mg Oral QPC supper   Continuous Infusions:  PRN Meds: acetaminophen **OR** acetaminophen, albuterol, hydrALAZINE, polyethylene glycol   Vital Signs    Vitals:   07/02/23 0404 07/02/23 0545 07/02/23 0655 07/02/23 0842  BP:   125/80 (!) 142/91  Pulse: 64 81 97 71  Resp: 20 16 18 19   Temp:   98.4 F (36.9 C) 98 F (36.7 C)  TempSrc:   Oral Oral  SpO2: 93% 91% 97% 96%  Weight:      Height:        Intake/Output Summary (Last 24 hours) at 07/02/2023 0945 Last data filed at 07/01/2023 2357 Gross per 24 hour  Intake --  Output 600 ml  Net -600 ml   Filed Weights   06/30/23 1259 07/01/23 1457  Weight: 68 kg 70.5 kg    Telemetry    Nsr with 1:1 AV conduction, 2:1 and AVWB - Personally Reviewed  ECG    none - Personally Reviewed  Physical Exam   GEN: No acute distress.   Neck: No JVD Cardiac: RRR, no murmurs, rubs, or gallops.  Respiratory: Clear to auscultation bilaterally. GI: Soft, nontender, non-distended  MS: No edema; No deformity. Neuro:  Nonfocal  Psych: Normal affect   Labs    Chemistry Recent Labs  Lab 06/30/23 1411 07/01/23 0511  NA 140 139  K 3.6 3.8  CL 107 107  CO2 25 24  GLUCOSE 117* 99  BUN 24* 21  CREATININE 0.58* 0.56*  CALCIUM 8.9 8.7*  GFRNONAA >60 >60  ANIONGAP 8 8     Hematology Recent Labs  Lab 06/30/23 1411 07/01/23 0511 07/02/23 0912  WBC 14.1* 8.3 7.5  RBC 4.17* 4.27 4.60  HGB 12.8* 12.6* 14.0  HCT 40.4 41.1 43.5  MCV 96.9 96.3 94.6  MCH 30.7 29.5 30.4  MCHC 31.7 30.7 32.2  RDW 15.9* 15.9* 15.6*  PLT 219 200 221    Cardiac EnzymesNo results for input(s): "TROPONINI" in the  last 168 hours. No results for input(s): "TROPIPOC" in the last 168 hours.   BNPNo results for input(s): "BNP", "PROBNP" in the last 168 hours.   DDimer No results for input(s): "DDIMER" in the last 168 hours.   Radiology    ECHOCARDIOGRAM COMPLETE  Result Date: 07/01/2023    ECHOCARDIOGRAM REPORT   Patient Name:   Shane [name] Date of Exam: 07/01/2023 Medical Rec #:  [id number]      Height:       67.0 in Accession #:    [id number]     Weight:       150.0 lb Date of Birth:  1933-06-04     BSA:          1.790 m Patient Age:    89 years       BP:           141/66 mmHg Patient Gender: M              HR:           60 bpm. [facility] Procedure: 2D Echo, Cardiac Doppler and Color Doppler Indications:  CAD Native Vessel I25.10  History:        Patient has no prior history of Echocardiogram examinations.                 CAD, Prior CABG; Arrythmias:Atrial Flutter.  Sonographer:    Darlys Gales Referring Phys: [phone] CARLOS MADERA IMPRESSIONS  1. Left ventricular ejection fraction, by estimation, is 55 to 60%. The left ventricle has normal function. The left ventricle has no regional wall motion abnormalities. There is moderate asymmetric left ventricular hypertrophy of the basal segment. Left ventricular diastolic parameters are consistent with Grade I diastolic dysfunction (impaired relaxation).  2. Right ventricular systolic function is normal. The right ventricular size is normal. Tricuspid regurgitation signal is inadequate for assessing PA pressure.  3. Left atrial size was moderately dilated.  4. The mitral valve is grossly normal. Trivial mitral valve regurgitation.  5. The aortic valve is tricuspid with restricted noncoronary cusp. There is mild calcification of the aortic valve. Aortic valve regurgitation is not visualized. Aortic valve sclerosis/calcification is present, without any evidence of aortic stenosis. Aortic valve mean gradient measures 4.0 mmHg.  6. The inferior vena cava is  normal in size with greater than 50% respiratory variability, suggesting right atrial pressure of 3 mmHg. Comparison(s): No prior Echocardiogram. FINDINGS  Left Ventricle: Left ventricular ejection fraction, by estimation, is 55 to 60%. The left ventricle has normal function. The left ventricle has no regional wall motion abnormalities. The left ventricular internal cavity size was normal in size. There is  moderate asymmetric left ventricular hypertrophy of the basal segment. Left ventricular diastolic parameters are consistent with Grade I diastolic dysfunction (impaired relaxation). Right Ventricle: The right ventricular size is normal. No increase in right ventricular wall thickness. Right ventricular systolic function is normal. Tricuspid regurgitation signal is inadequate for assessing PA pressure. Left Atrium: Left atrial size was moderately dilated. Right Atrium: Right atrial size was normal in size. Pericardium: There is no evidence of pericardial effusion. Mitral Valve: The mitral valve is grossly normal. Trivial mitral valve regurgitation. Tricuspid Valve: The tricuspid valve is grossly normal. Tricuspid valve regurgitation is mild. Aortic Valve: The aortic valve is tricuspid. There is mild calcification of the aortic valve. There is mild aortic valve annular calcification. Aortic valve regurgitation is not visualized. Aortic valve sclerosis/calcification is present, without any evidence of aortic stenosis. Aortic valve mean gradient measures 4.0 mmHg. Aortic valve peak gradient measures 7.6 mmHg. Aortic valve area, by VTI measures 2.62 cm. Pulmonic Valve: The pulmonic valve was grossly normal. Pulmonic valve regurgitation is trivial. Aorta: The aortic root is normal in size and structure. Venous: The inferior vena cava is normal in size with greater than 50% respiratory variability, suggesting right atrial pressure of 3 mmHg. IAS/Shunts: No atrial level shunt detected by color flow Doppler.  LEFT  VENTRICLE PLAX 2D LVIDd:         3.90 cm   Diastology LVIDs:         3.00 cm   LV e' medial:    5.77 cm/s LV PW:         1.10 cm   LV E/e' medial:  11.7 LV IVS:        1.50 cm   LV e' lateral:   6.42 cm/s LVOT diam:     2.00 cm   LV E/e' lateral: 10.5 LV SV:         61 LV SV Index:   34 LVOT Area:     3.14 cm  RIGHT VENTRICLE RV S prime:     10.00 cm/s TAPSE (M-mode): 2.0 cm LEFT ATRIUM             Index        RIGHT ATRIUM           Index LA Vol (A2C):   46.1 ml 25.76 ml/m  RA Area:     18.60 cm LA Vol (A4C):   90.5 ml 50.57 ml/m  RA Volume:   49.60 ml  27.72 ml/m LA Biplane Vol: 65.0 ml 36.32 ml/m  AORTIC VALVE AV Area (Vmax):    2.89 cm AV Area (Vmean):   2.62 cm AV Area (VTI):     2.62 cm AV Vmax:           138.00 cm/s AV Vmean:          97.600 cm/s AV VTI:            0.233 m AV Peak Grad:      7.6 mmHg AV Mean Grad:      4.0 mmHg LVOT Vmax:         127.00 cm/s LVOT Vmean:        81.400 cm/s LVOT VTI:          0.194 m LVOT/AV VTI ratio: 0.83  AORTA Ao Root diam: 3.30 cm MITRAL VALVE MV Area (PHT): 2.96 cm    SHUNTS MV Decel Time: 256 msec    Systemic VTI:  0.19 m MV E velocity: 67.70 cm/s  Systemic Diam: 2.00 cm MV A velocity: 70.70 cm/s MV E/A ratio:  0.96 Nona Dell MD Electronically signed by Nona Dell MD Signature Date/Time: 07/01/2023/12:03:21 PM    Final    DG Lumbar Spine Complete  Result Date: 06/30/2023 CLINICAL DATA:  Pain after fall. EXAM: LUMBAR SPINE - COMPLETE 4+ VIEW COMPARISON:  None Available. FINDINGS: Five non-rib-bearing lumbar vertebra. No traumatic subluxation. Trace grade 1 degenerative anterolisthesis of L4 on L5. Normal vertebral body heights. The posterior elements are intact. Moderate degenerative disc disease diffusely with disc space narrowing and spurring. Prominent lower lumbar facet hypertrophy. Advanced atherosclerosis. IMPRESSION: 1. No fracture or subluxation of the lumbar spine. 2. Moderate diffuse degenerative disc disease and lower lumbar facet  hypertrophy. Electronically Signed   By: Narda Rutherford M.D.   On: 06/30/2023 15:13   DG Knee Complete 4 Views Right  Result Date: 06/30/2023 CLINICAL DATA:  Pain after fall EXAM: RIGHT KNEE - COMPLETE 4+ VIEW COMPARISON:  None Available. FINDINGS: No evidence of fracture, dislocation, or joint effusion. Minor patellofemoral spurring. Chondrocalcinosis. Small quadriceps tendon enthesophyte. There are vascular calcifications. Soft tissues are otherwise unremarkable. IMPRESSION: 1. No fracture or subluxation of the right knee. 2. Chondrocalcinosis. Electronically Signed   By: Narda Rutherford M.D.   On: 06/30/2023 15:12   DG Knee Complete 4 Views Left  Result Date: 06/30/2023 CLINICAL DATA:  Pain after fall. EXAM: LEFT KNEE - COMPLETE 4+ VIEW COMPARISON:  None Available. FINDINGS: No evidence of fracture, dislocation, or joint effusion. Minor patellofemoral degenerative change. There are vascular calcifications. Soft tissues are otherwise unremarkable. IMPRESSION: 1. No fracture or subluxation of the left knee. 2. Minor patellofemoral degenerative change. Electronically Signed   By: Narda Rutherford M.D.   On: 06/30/2023 15:11   CT Head Wo Contrast  Result Date: 06/30/2023 CLINICAL DATA:  Head trauma, GCS=15, loss of consciousness (LOC) (Ped 0-17y); Neck trauma (Age >= 65y) EXAM: CT HEAD WITHOUT CONTRAST CT CERVICAL SPINE WITHOUT CONTRAST TECHNIQUE: Multidetector CT  imaging of the head and cervical spine was performed following the standard protocol without intravenous contrast. Multiplanar CT image reconstructions of the cervical spine were also generated. RADIATION DOSE REDUCTION: This exam was performed according to the departmental dose-optimization program which includes automated exposure control, adjustment of the mA and/or kV according to patient size and/or use of iterative reconstruction technique. COMPARISON:  None Available. FINDINGS: CT HEAD FINDINGS Brain: No hemorrhage. No  hydrocephalus. No extra-axial fluid collection. No CT evidence of an acute cortical infarct. No mass effect. No mass lesion. Vascular: No hyperdense vessel or unexpected calcification. Skull: Normal. Negative for fracture or focal lesion. Sinuses/Orbits: No middle ear or mastoid effusion. Paranasal sinuses clear. Bilateral lens replacement. Orbits are otherwise unremarkable. Other: None. CT CERVICAL SPINE FINDINGS Alignment: Normal. Skull base and vertebrae: No acute fracture. No primary bone lesion or focal pathologic process. Soft tissues and spinal canal: No prevertebral fluid or swelling. No visible canal hematoma. Disc levels: No evidence of high-grade spinal canal stenosis. There is likely severe left-sided neural foraminal stenosis at C5-C6 secondary to an eccentric left disc bulge. Upper chest: Negative. Other: 5 cm left thyroid nodule. Recommend further evaluation with a dedicated thyroid ultrasound, if not previously performed. IMPRESSION: 1. No acute intracranial abnormality. 2. No acute fracture or traumatic subluxation of the cervical spine. 3. Likely severe left-sided neural foraminal stenosis at C5-C6. 4. 5 cm left thyroid nodule. Recommend further evaluation with a dedicated thyroid ultrasound, if not previously performed. Electronically Signed   By: Lorenza Cambridge M.D.   On: 06/30/2023 14:17   CT Cervical Spine Wo Contrast  Result Date: 06/30/2023 CLINICAL DATA:  Head trauma, GCS=15, loss of consciousness (LOC) (Ped 0-17y); Neck trauma (Age >= 65y) EXAM: CT HEAD WITHOUT CONTRAST CT CERVICAL SPINE WITHOUT CONTRAST TECHNIQUE: Multidetector CT imaging of the head and cervical spine was performed following the standard protocol without intravenous contrast. Multiplanar CT image reconstructions of the cervical spine were also generated. RADIATION DOSE REDUCTION: This exam was performed according to the departmental dose-optimization program which includes automated exposure control, adjustment of the  mA and/or kV according to patient size and/or use of iterative reconstruction technique. COMPARISON:  None Available. FINDINGS: CT HEAD FINDINGS Brain: No hemorrhage. No hydrocephalus. No extra-axial fluid collection. No CT evidence of an acute cortical infarct. No mass effect. No mass lesion. Vascular: No hyperdense vessel or unexpected calcification. Skull: Normal. Negative for fracture or focal lesion. Sinuses/Orbits: No middle ear or mastoid effusion. Paranasal sinuses clear. Bilateral lens replacement. Orbits are otherwise unremarkable. Other: None. CT CERVICAL SPINE FINDINGS Alignment: Normal. Skull base and vertebrae: No acute fracture. No primary bone lesion or focal pathologic process. Soft tissues and spinal canal: No prevertebral fluid or swelling. No visible canal hematoma. Disc levels: No evidence of high-grade spinal canal stenosis. There is likely severe left-sided neural foraminal stenosis at C5-C6 secondary to an eccentric left disc bulge. Upper chest: Negative. Other: 5 cm left thyroid nodule. Recommend further evaluation with a dedicated thyroid ultrasound, if not previously performed. IMPRESSION: 1. No acute intracranial abnormality. 2. No acute fracture or traumatic subluxation of the cervical spine. 3. Likely severe left-sided neural foraminal stenosis at C5-C6. 4. 5 cm left thyroid nodule. Recommend further evaluation with a dedicated thyroid ultrasound, if not previously performed. Electronically Signed   By: Lorenza Cambridge M.D.   On: 06/30/2023 14:17    Cardiac Studies   See above  Patient Profile     89 y.o. male admitted with fall and high grade  heart block  Assessment & Plan    High grade heart block - he is conducting today but I strongly suspect that his fall was due to a [employer]	[place] attack. PPM insertion is indicated. We will try to do later today, if not Monday.     For questions or updates, please contact [facility] Please consult [email or web address] for contact info  under Cardiology/STEMI.      Signed, Lewayne Bunting, MD  07/02/2023, 9:45 AM

## 2023-07-02 NOTE — Plan of Care (Signed)

## 2023-07-02 NOTE — Evaluation (Signed)
Physical Therapy Brief Evaluation and Discharge Note Patient Details Name: Shane [name] MRN: [id number] DOB: June 22, 1933 Today's Date: 07/02/2023   History of Present Illness  Pt is an 89yo male presenting to [facility] after a mechanical fall onto his elbows and found down. CT neck showed severe left-sided neuroforaminal stenosis C5-C6 +5 cm L thyroid nodule, all other imaging negative for acute findings.   PMH: BPH, bradycardia, HLD, AV Block type 1, polymyalgia rheumatica, lumbar disc surgery, CABG.  Clinical Impression  Pt admitted with above diagnosis. Pt currently with functional limitations due to the deficits listed below (see PT Problem List). Pt demonstrated modified independence with functional mobility and transfers, SUP for ambulation in hallway for 359ft, scored 20/24 on Dynamic Gait Index indicating pt is not at increased risk for falls at this time. Recommended pt's family stay with him for a few days for supervision for safety upon discharge and purchase life alert system; also recommended pt utilize [facility] or RW (he has both) for ambulation moving forward, pt verbalized understanding. Pt anxious to discharge so he can visit his wife in the nursing home. No acute skilled therapy needs at this time, pt is placed on mobility specialist caseload. Should needs change, please reconsult. Thank you for this referal.      PT Assessment All further PT needs can be met in the next venue of care  Assistance Needed at Discharge  Set up Supervision/Assistance    Equipment Recommendations None recommended by PT (recommended pt use SPC or RW upon discharge)  Recommendations for Other Services       Precautions/Restrictions Precautions Precautions: Fall Precaution Comments: recent fall Restrictions Weight Bearing Restrictions: No        Mobility  Bed Mobility Rolling: Independent Supine/Sidelying to sit: Independent Sit to supine/sidelying: Independent    Transfers Overall transfer  level: Independent Equipment used: None                    Ambulation/Gait Ambulation/Gait assistance: Supervision Gait Distance (Feet): 350 Feet Assistive device: None Gait Pattern/deviations: WFL(Within Functional Limits) Gait Speed: Pace WFL General Gait Details: Pt ambulated with SUP without device for 393ft, no novert LOB noted nor physical assist required.  Home Activity Instructions    Stairs            Modified Rankin (Stroke Patients Only)        Balance Overall balance assessment: History of Falls               Standardized Balance Assessment Standardized Balance Assessment : Dynamic Gait Index Dynamic Gait Index Level Surface: Normal Change in Gait Speed: Normal Gait with Horizontal Head Turns: Mild Impairment Gait with Vertical Head Turns: Mild Impairment Gait and Pivot Turn: Normal Step Over Obstacle: Mild Impairment Step Around Obstacles: Normal Steps: Mild Impairment Total Score: 20      Pertinent Vitals/Pain PT - Brief Vital Signs All Vital Signs Stable: Yes Pain Assessment Pain Assessment: 0-10 Pain Score: 5  Pain Location: b/l arms, b/l knees Pain Descriptors / Indicators: Discomfort, Dull Pain Intervention(s): Limited activity within patient's tolerance, Monitored during session, Repositioned     Home Living Family/patient expects to be discharged to:: Private residence Living Arrangements: Alone Available Help at Discharge: Family;Available 24 hours/day Home Environment: Stairs to enter;Rail - right;Rail - left  Stairs-Number of Steps: 7 Home Equipment: Rolling Walker (2 wheels);Cane - single point        Prior Function Level of Independence: Independent      UE/LE  Assessment   UE ROM/Strength/Tone/Coordination: [facility]    LE ROM/Strength/Tone/Coordination: [facility]      Communication   Communication Communication: Hearing impairment Cueing Techniques: Verbal cues;Visual cues     Cognition Overall Cognitive  Status: Appears within functional limits for tasks assessed/performed       General Comments General comments (skin integrity, edema, etc.): Daughter Shane [name] and Shane [name] present    Exercises     Assessment/Plan    PT Problem List Decreased balance;Decreased safety awareness;Pain       PT Visit Diagnosis History of falling (Z91.81)    No Skilled PT     Co-evaluation                AMPAC 6 Clicks Help needed turning from your back to your side while in a flat bed without using bedrails?: None Help needed moving from lying on your back to sitting on the side of a flat bed without using bedrails?: None Help needed moving to and from a bed to a chair (including a wheelchair)?: None Help needed standing up from a chair using your arms (e.g., wheelchair or bedside chair)?: None Help needed to walk in hospital room?: A Little Help needed climbing 3-5 steps with a railing? : A Little 6 Click Score: 22      End of Session Equipment Utilized During Treatment: Gait belt Activity Tolerance: Patient tolerated treatment well;No increased pain Patient left: in bed;with call bell/phone within reach;with family/visitor present;with nursing/sitter in room Nurse Communication: Mobility status PT Visit Diagnosis: History of falling (Z91.81)     Time: 4098-1191 PT Time Calculation (min) (ACUTE ONLY): 31 min  Charges:   PT Evaluation $PT Eval Low Complexity: 1 Low PT Treatments $Gait Training: 8-22 mins    Shane [name], PT, DPT [facility] Rehabilitation Department Office: [phone]  Shane [name]  07/02/2023, 4:29 PM

## 2023-07-02 NOTE — Plan of Care (Signed)
  Problem: Education: Goal: Knowledge of General Education information will improve Description: Including pain rating scale, medication(s)/side effects and non-pharmacologic comfort measures Outcome: Progressing   Problem: Clinical Measurements: Goal: Ability to maintain clinical measurements within normal limits will improve Outcome: Progressing Goal: Will remain free from infection Outcome: Progressing Goal: Diagnostic test results will improve Outcome: Not Progressing Goal: Respiratory complications will improve Outcome: Progressing Goal: Cardiovascular complication will be avoided Outcome: Progressing   Problem: Activity: Goal: Risk for activity intolerance will decrease Outcome: Progressing

## 2023-07-02 NOTE — Progress Notes (Signed)
PHARMACY - ANTICOAGULATION CONSULT NOTE  Pharmacy Consult for lovenox  Indication: atrial fibrillation  No Known Allergies  Patient Measurements: Height: 5\' 7"  (170.2 cm) Weight: 70.5 kg (155 lb 6.8 oz) IBW/kg (Calculated) : 66.1  Vital Signs: Temp: 98 F (36.7 C) (10/26 0842) Temp Source: Oral (10/26 0842) BP: 142/91 (10/26 0842) Pulse Rate: 71 (10/26 0842)  Labs: Recent Labs    06/30/23 1411 07/01/23 0511 07/01/23 1511 07/01/23 1802  HGB 12.8* 12.6*  --   --   HCT 40.4 41.1  --   --   PLT 219 200  --   --   LABPROT 33.5* 25.3*  --   --   INR 3.3* 2.3*  --   --   CREATININE 0.58* 0.56*  --   --   TROPONINIHS  --   --  95* 96*    Estimated Creatinine Clearance: 59.7 mL/min (A) (by C-G formula based on SCr of 0.56 mg/dL (L)).   Medical History: Past Medical History:  Diagnosis Date   Arteriosclerotic cardiovascular disease (ASCVD)    CABG surgery in 1990. MI in 1986; coronary angiography in 2003-critical LAD with patent LIMA; total obstruction of the RCA with patent RIMA; low normal EF.   Atrial flutter, paroxysmal (HCC)    asymptomatic; onset in 2010; 4:1 AVB with low-dose metoprolol; moderate left      atrial enlargement and mild left ventricular hypertrophy with normal ejection fraction by echocardiography   Benign prostatic hypertrophy    Bifascicular block    EKG 07/27/22: SB with SA, first-degree AV block, RBBB, left posterior fascicular block (bifascicular block) .   Bradycardia    a. requiring cessation of beta blocker 12/2016.   Chronic anticoagulation    Current use of long term anticoagulation    Remains on chronic Coumadin   Hyperlipidemia    Lipid profile in 09/2010:196, 84, 54, 125   Mobitz type 1 second degree atrioventricular block 12/17/2016   Nephrolithiasis    Polymyalgia rheumatica (HCC)     Medications:  Scheduled:   atorvastatin  80 mg Oral Daily   escitalopram  5 mg Oral QPM   tamsulosin  0.4 mg Oral QPC supper   Infusions:    Assessment: 89 yo male with a history of afib on warfarin. Patient having severe bradycardia and plan is to get a PM once INR is below 2. Pharmacy has been consulted to dose lovenox in the setting of afib once INR is < 2 with follow up to hold prior to procedure (likely 10/28).   INR today is 2.3, will reassess tomorrow and start lovenox if appropriate.   Goal of Therapy:  Monitor platelets by anticoagulation protocol: Yes   Plan:  Hold lovenox in the setting of elevated INR  Monitor CBC and s/sx of bleeding   Leina Babe I Haddon Fyfe 07/02/2023,8:55 AM

## 2023-07-02 NOTE — Progress Notes (Signed)
HOSPITALIST ROUNDING NOTE JAHMAR DOBISH NWG:[id number]  DOB: 11-04-1932  DOA: 06/30/2023  PCP: [name], MD  07/02/2023,8:06 AM   LOS: 1 day      Code Status: Full From: Home-lives alone  current Dispo: Unclear at this time    89 year old home dwelling white male CABG in 1990 Bradycardia requiring admission back in 2018 with AV block-event monitor showed 2-1 block only in the early morning hours-evaluation by Dr. Ladona Ridgel showed Mobitz 1-is on chronic anticoagulation secondary to a flutter Admitted [facility] 10/24 with a fall onto his elbows found down-found to have severe bradycardia Imaging bilateral knees lumbar spine head showed no specific findings CT neck severe left-sided neuroforaminal stenosis C5-C6 +5 cm L thyroid nodule White count 14 platelet 219 BUN/creatinine 24/0.5 INR 3.3 TSH 0.33  Transfer [facility] 10/25 for EP eval  10/25 echo EF = 55-60% moderate asymmetry LV grade 1 DD  Plan  Symptomatic bradycardia 2/2 Mobitz 2-1 AVB On monitors has pauses of less than 2 seconds-not on any AV nodal agents currently--seems like will be getting a pacemaker once INR is below threshold of?  1.8 as per EP  Underlying a flutter rhythm on chronic Coumadin Coumadin held-pharmacy consult placed for heparin gtt. once below 2-watch on monitors for tachyarrhythmia  HFpEF 10/25 echo Likely related to tachycardia, HTN etc.-not volume overloaded-only monitor Troponin in the 90s is gray zone and likely mediated by arrhythmia He is not having overt chest pain although he has dyssynchrony on echo -Would defer to cardiology given he has a history of CABG in 1990  Fall without major injury Cannot raise his right shoulder really well and seems to have some past-pointing?  Has had a stroke in the remote past versus just inability to raise arms If further pain needs imaging-get OT to see additionally  CABG 1990 Continue atorvastatin 80  Thyroid nodule TSH is 0.3-5 cm left thyroid nodule merits  characterization with ultrasound especially with tachyarrhythmia-will order nonemergent US neck  Left foraminal stenosis C5-C6 See above discussion-could have some foraminal stenosis causing this  Depression Continue Lexapro 5-do not give anxiolytics or ID meds in a patient who is 89 years old-order placed  BPH Flomax 0.4 \   DVT prophylaxis: Therapeutic on Coumadin at this time-needs Lovenox transition  Status is: Inpatient Remains inpatient appropriate because:   Requires pacemaker likely      Subjective: Awake coherent Got frustrated overnight as was getting disturbed-was given Ativan for unclear reasons He is orientable quite pleasant and able to tell me that he is in [place] cannot tell me date time month year  Objective + exam Vitals:   07/01/23 2326 07/02/23 0404 07/02/23 0545 07/02/23 0655  BP: 138/74   125/80  Pulse: 70 64 81 97  Resp: 20 20 16 18   Temp: 98.8 F (37.1 C)   98.4 F (36.9 C)  TempSrc: Oral   Oral  SpO2: 97% 93% 91% 97%  Weight:      Height:       Filed Weights   06/30/23 1259 07/01/23 1457  Weight: 68 kg 70.5 kg    Examination:  Frail-appearing white male no distress no icterus no pallor supraclavicular wasting S1-S2 seems to be in sinus Chest is clear no wheeze Abdomen soft no rebound no guarding A little bit of past-pointing on the right some range of motion issues on the right as well -Monitor show heart rate in the 50s with rate controlled a flutter  Data Reviewed: reviewed   CBC  Component Value Date/Time   WBC 8.3 07/01/2023 0511   RBC 4.27 07/01/2023 0511   HGB 12.6 (L) 07/01/2023 0511   HCT 41.1 07/01/2023 0511   PLT 200 07/01/2023 0511   MCV 96.3 07/01/2023 0511   MCH 29.5 07/01/2023 0511   MCHC 30.7 07/01/2023 0511   RDW 15.9 (H) 07/01/2023 0511   LYMPHSABS 0.6 (L) 06/30/2023 1411   MONOABS 0.9 06/30/2023 1411   EOSABS 0.0 06/30/2023 1411   BASOSABS 0.0 06/30/2023 1411      Latest Ref Rng & Units  07/01/2023    5:11 AM 06/30/2023    2:11 PM 04/07/2017   11:55 AM  CMP  Glucose 70 - 99 mg/dL 99  616  98   BUN 8 - 23 mg/dL 21  24  17    Creatinine 0.61 - 1.24 mg/dL 0.73  7.10  6.26   Sodium 135 - 145 mmol/L 139  140  138   Potassium 3.5 - 5.1 mmol/L 3.8  3.6  4.4   Chloride 98 - 111 mmol/L 107  107  105   CO2 22 - 32 mmol/L 24  25  26    Calcium 8.9 - 10.3 mg/dL 8.7  8.9  9.1      Scheduled Meds:  atorvastatin  80 mg Oral Daily   escitalopram  5 mg Oral QPM   tamsulosin  0.4 mg Oral QPC supper   Continuous Infusions:  Time  55  Rhetta Mura, MD  [facility]

## 2023-07-03 DIAGNOSIS — R001 Bradycardia, unspecified: Secondary | ICD-10-CM | POA: Diagnosis not present

## 2023-07-03 LAB — BASIC METABOLIC PANEL
Anion gap: 6 (ref 5–15)
BUN: 17 mg/dL (ref 8–23)
CO2: 26 mmol/L (ref 22–32)
Calcium: 8.8 mg/dL — ABNORMAL LOW (ref 8.9–10.3)
Chloride: 106 mmol/L (ref 98–111)
Creatinine, Ser: 0.56 mg/dL — ABNORMAL LOW (ref 0.61–1.24)
GFR, Estimated: >60 mL/min (ref >60)
Glucose, Bld: 102 mg/dL — ABNORMAL HIGH (ref 70–99)
Potassium: 3.8 mmol/L (ref 3.5–5.1)
Sodium: 138 mmol/L (ref 135–145)

## 2023-07-03 LAB — SURGICAL PCR SCREEN
MRSA, PCR: NEGATIVE
Staphylococcus aureus: NEGATIVE

## 2023-07-03 LAB — MAGNESIUM: Magnesium: 2.1 mg/dL (ref 1.7–2.4)

## 2023-07-03 LAB — PROTIME-INR
INR: 1.2 (ref 0.8–1.2)
Prothrombin Time: 15 s (ref 11.4–15.2)

## 2023-07-03 MED ORDER — SODIUM CHLORIDE 0.9% FLUSH
10.0000 mL | Freq: Two times a day (BID) | INTRAVENOUS | Status: DC
  Administered 2023-07-03 – 2023-07-04 (×2): 10 mL via INTRAVENOUS

## 2023-07-03 MED ORDER — CEFAZOLIN SODIUM-DEXTROSE 2-4 GM/100ML-% IV SOLN
2.0000 g | INTRAVENOUS | Status: AC
  Filled 2023-07-03: qty 100

## 2023-07-03 MED ORDER — SODIUM CHLORIDE 0.9 % IV SOLN
80.0000 mg | INTRAVENOUS | Status: AC
  Filled 2023-07-03: qty 2

## 2023-07-03 MED ORDER — MELATONIN 3 MG PO TABS
3.0000 mg | ORAL_TABLET | Freq: Every evening | ORAL | Status: DC | PRN
  Administered 2023-07-03 – 2023-07-05 (×4): 3 mg via ORAL
  Filled 2023-07-03 (×4): qty 1

## 2023-07-03 NOTE — Evaluation (Signed)
Occupational Therapy Evaluation Patient Details Name: Shane [name] MRN: [id number] DOB: 24-Dec-1932 Today's Date: 07/03/2023   History of Present Illness Pt is an 89yo male presenting to [facility] after a mechanical fall onto his elbows and found down. CT neck showed severe left-sided neuroforaminal stenosis C5-C6 +5 cm L thyroid nodule, all other imaging negative for acute findings. PPM planned for 10/28.    PMH: BPH, bradycardia, HLD, AV Block type 1, polymyalgia rheumatica, lumbar disc surgery, CABG.   Clinical Impression   PTA pt lives alone independently, drives and visits his wife daily at her ALF.  Shane [name] states his grand daughter assists with filling his pillbox, then he manages his medicines independently. Required min A to stand from lower surface, however pt states he hasn't been "up much". Mobility progressed to S as session progressed. Began reviewing pacemaker precautions/handout provided. Will follow up after PPM to facilitate safe DC home. Pt will most likely benefit from close S/assistance for ADL for several days after DC, however no OT follow up needed. Recommend Fall Alert system - family states they are in the process of looking into this.       If plan is discharge home, recommend the following: A little help with walking and/or transfers;A little help with bathing/dressing/bathroom;Assist for transportation    Functional Status Assessment  Patient has had a recent decline in their functional status and demonstrates the ability to make significant improvements in function in a reasonable and predictable amount of time.  Equipment Recommendations  BSC/3in1 (to use over toilet to increase height)    Recommendations for Other Services       Precautions / Restrictions Precautions Precautions: Fall Precaution Comments: recent fall Restrictions Weight Bearing Restrictions: No      Mobility Bed Mobility     Rolling: Independent              Transfers Overall  transfer level: Needs assistance Equipment used: None, 1 person hand held assist Transfers: Sit to/from Stand, Bed to chair/wheelchair/BSC Sit to Stand: Contact guard assist     Step pivot transfers: Contact guard assist     General transfer comment: mobility improved once up and moving around      Balance Overall balance assessment: History of Falls (1 fall PTA)                                         ADL either performed or assessed with clinical judgement   ADL Overall ADL's : Needs assistance/impaired                                     Functional mobility during ADLs: Contact guard assist General ADL Comments: overall set up/S for ADL tasks; Pacemaker  handout issued - began education; will need assistance intitially after DC; heavily uses LUE to push up to stand     Vision Baseline Vision/History: 1 Wears glasses (reading)       Perception         Praxis         Pertinent Vitals/Pain Pain Assessment Pain Assessment: Faces Faces Pain Scale: Hurts a little bit Pain Location: b/l arms, b/l knees Pain Descriptors / Indicators: Discomfort, Dull Pain Intervention(s): Limited activity within patient's tolerance     Extremity/Trunk Assessment Upper Extremity Assessment Upper Extremity Assessment: Right  hand dominant;RUE deficits/detail;LUE deficits/detail RUE Deficits / Details: shoulder limitations but functional LUE Deficits / Details: shoulder more imparied than R; @ 30 degrees active shoulder flexion; all other ROM WFL elbow/hand LUE Coordination: decreased gross motor   Lower Extremity Assessment Lower Extremity Assessment: Defer to PT evaluation   Cervical / Trunk Assessment Cervical / Trunk Assessment: Normal   Communication Communication Communication: Hearing impairment   Cognition Arousal: Alert Behavior During Therapy: WFL for tasks assessed/performed Overall Cognitive Status:  (most likely at baseline; per  fmaily confusion has resolved)                                       General Comments       Exercises     Shoulder Instructions      Home Living Family/patient expects to be discharged to:: Private residence Living Arrangements: Alone Available Help at Discharge: Available 24 hours/day (short term) Type of Home: House Home Access: Stairs to enter [employer] of Steps: 7 Entrance Stairs-Rails: Right;Left;Can reach both Home Layout: One level     Bathroom Shower/Tub: Producer, television/film/video: Standard Bathroom Accessibility: Yes How Accessible: Accessible via walker Home Equipment: Rolling Walker (2 wheels);Cane - single point;Shower seat          Prior Functioning/Environment Prior Level of Function : Driving;Independent/Modified Independent             Mobility Comments: no use of AD; walks 2 miles/day; visits his wife daily at her SNF for 5 hours Grand daughter fills up Pillbox; pt drives to get a biscuit in the am then fixes himself dinner          OT Problem List: Decreased activity tolerance;Decreased safety awareness;Cardiopulmonary status limiting activity;Decreased knowledge of precautions      OT Treatment/Interventions: Self-care/ADL training;DME and/or AE instruction;Therapeutic activities;Patient/family education    OT Goals(Current goals can be found in the care plan section) Acute Rehab OT Goals Patient Stated Goal: to go and visit his wife OT Goal Formulation: With patient/family Time For Goal Achievement: 07/17/23 Potential to Achieve Goals: Good  OT Frequency: Min 1X/week    Co-evaluation              AM-PAC OT "6 Clicks" Daily Activity     Outcome Measure Help from another person eating meals?: None Help from another person taking care of personal grooming?: A Little Help from another person toileting, which includes using toliet, bedpan, or urinal?: A Little Help from another person bathing  (including washing, rinsing, drying)?: A Little Help from another person to put on and taking off regular upper body clothing?: A Little Help from another person to put on and taking off regular lower body clothing?: A Little 6 Click Score: 19   End of Session Equipment Utilized During Treatment: Gait belt Nurse Communication: Mobility status;Other (comment) (encourage mobility)  Activity Tolerance: Patient tolerated treatment well Patient left: in bed;with call bell/phone within reach;with bed alarm set;with family/visitor present  OT Visit Diagnosis: Unsteadiness on feet (R26.81);History of falling (Z91.81)                Time: 8119-1478 OT Time Calculation (min): 21 min Charges:  OT General Charges $OT Visit: 1 Visit OT Evaluation $OT Eval Moderate Complexity: 1 Mod  Akeiba Axelson, OT/L   Acute OT Clinical Specialist Acute Rehabilitation Services Pager [phone] Office [phone]   [facility] 07/03/2023, 3:22 PM

## 2023-07-03 NOTE — Plan of Care (Signed)
  Problem: Education: Goal: Knowledge of General Education information will improve Description: Including pain rating scale, medication(s)/side effects and non-pharmacologic comfort measures Outcome: Progressing   Problem: Health Behavior/Discharge Planning: Goal: Ability to manage health-related needs will improve Outcome: Progressing   Problem: Clinical Measurements: Goal: Ability to maintain clinical measurements within normal limits will improve Outcome: Progressing Goal: Will remain free from infection Outcome: Progressing Goal: Diagnostic test results will improve Outcome: Progressing Goal: Respiratory complications will improve Outcome: Progressing Goal: Cardiovascular complication will be avoided Outcome: Progressing   Problem: Activity: Goal: Risk for activity intolerance will decrease Outcome: Progressing   Problem: Nutrition: Goal: Adequate nutrition will be maintained Outcome: Progressing   Problem: Coping: Goal: Level of anxiety will decrease Outcome: Progressing   Problem: Elimination: Goal: Will not experience complications related to bowel motility Outcome: Progressing Goal: Will not experience complications related to urinary retention Outcome: Progressing   Problem: Pain Management: Goal: General experience of comfort will improve Outcome: Progressing   Problem: Safety: Goal: Ability to remain free from injury will improve Outcome: Progressing   Problem: Skin Integrity: Goal: Risk for impaired skin integrity will decrease Outcome: Progressing   Problem: Education: Goal: Knowledge of cardiac device and self-care will improve Outcome: Progressing Goal: Ability to safely manage health related needs after discharge will improve Outcome: Progressing Goal: Individualized Educational Video(s) Outcome: Progressing   Problem: Cardiac: Goal: Ability to achieve and maintain adequate cardiopulmonary perfusion will improve Outcome: Progressing

## 2023-07-03 NOTE — Plan of Care (Signed)
  Problem: Health Behavior/Discharge Planning: Goal: Ability to manage health-related needs will improve Outcome: Progressing   Problem: Clinical Measurements: Goal: Ability to maintain clinical measurements within normal limits will improve Outcome: Progressing Goal: Will remain free from infection Outcome: Progressing Goal: Diagnostic test results will improve Outcome: Progressing Goal: Respiratory complications will improve Outcome: Progressing   Problem: Nutrition: Goal: Adequate nutrition will be maintained Outcome: Progressing   Problem: Coping: Goal: Level of anxiety will decrease Outcome: Progressing

## 2023-07-03 NOTE — Progress Notes (Signed)
HOSPITALIST ROUNDING NOTE Shane [name] WJX:[id number]  DOB: 1933-04-04  DOA: 06/30/2023  PCP: Shane Perches, MD  07/03/2023,10:05 AM   LOS: 2 days      Code Status: Full From: Home-lives alone  current Dispo: Unclear at this time    89 year old home dwelling white male CABG in 1990 Bradycardia requiring admission back in 2018 with AV block-event monitor showed 2-1 block only in the early morning hours-evaluation by Dr. Ladona Ridgel showed Mobitz 1-is on chronic anticoagulation secondary to a flutter Admitted [facility] 10/24 with a fall onto his elbows found down-found to have severe bradycardia Imaging bilateral knees lumbar spine head showed no specific findings CT neck severe left-sided neuroforaminal stenosis C5-C6 +5 cm L thyroid nodule White count 14 platelet 219 BUN/creatinine 24/0.5 INR 3.3 TSH 0.33  Transfer [facility] 10/25 for EP eval  10/25 echo EF = 55-60% moderate asymmetry LV grade 1 DD 10/26 ultrasound neck shows heterogeneous thyroid with 1.5T RADS 3 nodule requiring a follow-up in 1 year  Plan  Symptomatic bradycardia 2/2 Mobitz 2-1 AVB with persistent falls On monitors has pauses of less than 2 seconds-not on any AV nodal agents currently- Rest as per electrophysiology-input appreciated  Underlying a flutter rhythm on chronic Coumadin Per EP  HFpEF 10/25 echo Likely related to tachycardia, HTN etc.-not volume overloaded-only monitor Troponin mediated by arrhythmia He is not having overt chest pain although he has dyssynchrony on echo -Would defer to cardiology given he has a history of CABG in 1990  Fall without major injury If further pain needs imaging-get OT to see additionally--he overall seems improved  CABG 1990 Continue atorvastatin 80  Thyroid nodule TSH is 0.3-5 cm left thyroid nodule merits characterization needs follow-up in 1 year  Left foraminal stenosis C5-C6 See above discussion-could have some foraminal stenosis causing pain in right  arm  Depression Continue Lexapro 5-do not give anxiolytics or ID meds in a patient who is 89 years old-order placed  BPH Flomax 0.4 \   DVT prophylaxis: Deferred EP as procedure may be tomorrow  Status is: Inpatient Remains inpatient appropriate because:   Requires pacemaker likely      Subjective:  Slept well got melatonin overnight no chest pain-I see a little bit of bradycardia on the monitors but overall he looks well had a good breakfast overall Daughter at bedside asking to shower  Objective + exam Vitals:   07/03/23 0000 07/03/23 0400 07/03/23 0426 07/03/23 0427  BP:    (!) 155/62  Pulse:   (!) 50 (!) 52  Resp:    18  Temp:   97.8 F (36.6 C) 97.8 F (36.6 C)  TempSrc:   Oral Oral  SpO2: 96% 96%    Weight:      Height:       Filed Weights   06/30/23 1259 07/01/23 1457  Weight: 68 kg 70.5 kg    Examination:  Looks fair more coherent  S1-S2 no murmur some bradycardia with pauses less than 2 seconds Chest is clear no wheeze Abdomen soft no rebound no guarding Bruising on right knee   Data Reviewed: reviewed   CBC    Component Value Date/Time   WBC 7.5 07/02/2023 0912   RBC 4.60 07/02/2023 0912   HGB 14.0 07/02/2023 0912   HCT 43.5 07/02/2023 0912   PLT 221 07/02/2023 0912   MCV 94.6 07/02/2023 0912   MCH 30.4 07/02/2023 0912   MCHC 32.2 07/02/2023 0912   RDW 15.6 (H) 07/02/2023 0912   LYMPHSABS 0.9  07/02/2023 0912   MONOABS 0.5 07/02/2023 0912   EOSABS 0.1 07/02/2023 0912   BASOSABS 0.0 07/02/2023 0912      Latest Ref Rng & Units 07/03/2023    4:21 AM 07/02/2023    9:12 AM 07/01/2023    5:11 AM  CMP  Glucose 70 - 99 mg/dL 366  440  99   BUN 8 - 23 mg/dL 17  18  21    Creatinine 0.61 - 1.24 mg/dL 3.47  4.25  9.56   Sodium 135 - 145 mmol/L 138  138  139   Potassium 3.5 - 5.1 mmol/L 3.8  3.8  3.8   Chloride 98 - 111 mmol/L 106  105  107   CO2 22 - 32 mmol/L 26  21  24    Calcium 8.9 - 10.3 mg/dL 8.8  9.0  8.7   Total Protein 6.5 -  8.1 g/dL  6.4    Total Bilirubin 0.3 - 1.2 mg/dL  1.5    Alkaline Phos 38 - 126 U/L  61    AST 15 - 41 U/L  427    ALT 0 - 44 U/L  100       Scheduled Meds:  atorvastatin  80 mg Oral Daily   escitalopram  5 mg Oral QPM   tamsulosin  0.4 mg Oral QPC supper   Continuous Infusions:  Time  55  Rhetta Mura, MD  [facility]

## 2023-07-03 NOTE — Progress Notes (Signed)
Progress Note  Patient Name: KAMRAN ROBSON Date of Encounter: 07/03/2023  Primary Cardiologist: Dina Rich, MD   Subjective   Grandaughter states he had a better night last night.  Inpatient Medications    Scheduled Meds:  atorvastatin  80 mg Oral Daily   escitalopram  5 mg Oral QPM   tamsulosin  0.4 mg Oral QPC supper   Continuous Infusions:  PRN Meds: acetaminophen **OR** acetaminophen, albuterol, hydrALAZINE, melatonin, polyethylene glycol   Vital Signs    Vitals:   07/03/23 0000 07/03/23 0400 07/03/23 0426 07/03/23 0427  BP:    (!) 155/62  Pulse:   (!) 50 (!) 52  Resp:    18  Temp:   97.8 F (36.6 C) 97.8 F (36.6 C)  TempSrc:   Oral Oral  SpO2: 96% 96%    Weight:      Height:        Intake/Output Summary (Last 24 hours) at 07/03/2023 0902 Last data filed at 07/02/2023 1801 Gross per 24 hour  Intake --  Output 100 ml  Net -100 ml   Filed Weights   06/30/23 1259 07/01/23 1457  Weight: 68 kg 70.5 kg    Telemetry    NSR with AVWB - Personally Reviewed  ECG    none - Personally Reviewed  Physical Exam   GEN: No acute distress.   Neck: No JVD Cardiac: RRR, no murmurs, rubs, or gallops.  Respiratory: Clear to auscultation bilaterally. GI: Soft, nontender, non-distended  MS: No edema; No deformity. Neuro:  Nonfocal  Psych: Normal affect   Labs    Chemistry Recent Labs  Lab 07/01/23 0511 07/02/23 0912 07/03/23 0421  NA 139 138 138  K 3.8 3.8 3.8  CL 107 105 106  CO2 24 21* 26  GLUCOSE 99 198* 102*  BUN 21 18 17   CREATININE 0.56* 0.61 0.56*  CALCIUM 8.7* 9.0 8.8*  PROT  --  6.4*  --   ALBUMIN  --  3.5  --   AST  --  427*  --   ALT  --  100*  --   ALKPHOS  --  61  --   BILITOT  --  1.5*  --   GFRNONAA >60 >60 >60  ANIONGAP 8 12 6      Hematology Recent Labs  Lab 06/30/23 1411 07/01/23 0511 07/02/23 0912  WBC 14.1* 8.3 7.5  RBC 4.17* 4.27 4.60  HGB 12.8* 12.6* 14.0  HCT 40.4 41.1 43.5  MCV 96.9 96.3 94.6   MCH 30.7 29.5 30.4  MCHC 31.7 30.7 32.2  RDW 15.9* 15.9* 15.6*  PLT 219 200 221    Cardiac EnzymesNo results for input(s): "TROPONINI" in the last 168 hours. No results for input(s): "TROPIPOC" in the last 168 hours.   BNPNo results for input(s): "BNP", "PROBNP" in the last 168 hours.   DDimer No results for input(s): "DDIMER" in the last 168 hours.   Radiology    US THYROID  Result Date: 07/03/2023 CLINICAL DATA:  Goiter. EXAM: THYROID ULTRASOUND TECHNIQUE: Ultrasound examination of the thyroid gland and adjacent soft tissues was performed. COMPARISON:  None Available. FINDINGS: Parenchymal Echotexture: Mildly heterogenous Isthmus: 0.4 cm Right lobe: 4.8 x 2.1 x 2.0 cm Left lobe: 4.2 x 3.0 x 2.0 cm _________________________________________________________ Estimated total number of nodules >/= 1 cm: 1 Number of spongiform nodules >/=  2 cm not described below (TR1): 0 Number of mixed cystic and solid nodules >/= 1.5 cm not described below (TR2): 0 _________________________________________________________  Nodule # 1 left: Isoechoic solid nodule in the left mid to lower gland measures 1.4 x 1.5 x 1.1 cm. Margins are ill-defined. No calcifications or suspicious features. Findings are consistent with TI-RADS category 3. *Given size (>/= 1.5 - 2.4 cm) and appearance, a follow-up ultrasound in 1 year should be considered based on TI-RADS criteria. IMPRESSION: 1. Mildly enlarged and heterogeneous thyroid gland. 2. Solitary 1.5 cm TI-RADS category 3 nodule in the left mid to lower gland meets criteria for imaging surveillance. Recommend follow-up ultrasound in 1 year. The above is in keeping with the ACR TI-RADS recommendations - J Am Coll Radiol 2017;14:587-595. Electronically Signed   By: Malachy Moan M.D.   On: 07/03/2023 07:27   ECHOCARDIOGRAM COMPLETE  Result Date: 07/01/2023    ECHOCARDIOGRAM REPORT   Patient Name:   DAVELLE BEITZEL Date of Exam: 07/01/2023 Medical Rec #:  [id number]       Height:       67.0 in Accession #:    [id number]     Weight:       150.0 lb Date of Birth:  Jun 21, 1933     BSA:          1.790 m Patient Age:    89 years       BP:           141/66 mmHg Patient Gender: M              HR:           60 bpm. [facility] Procedure: 2D Echo, Cardiac Doppler and Color Doppler Indications:    CAD Native Vessel I25.10  History:        Patient has no prior history of Echocardiogram examinations.                 CAD, Prior CABG; Arrythmias:Atrial Flutter.  Sonographer:    Darlys Gales Referring Phys: [id number] CARLOS MADERA IMPRESSIONS  1. Left ventricular ejection fraction, by estimation, is 55 to 60%. The left ventricle has normal function. The left ventricle has no regional wall motion abnormalities. There is moderate asymmetric left ventricular hypertrophy of the basal segment. Left ventricular diastolic parameters are consistent with Grade I diastolic dysfunction (impaired relaxation).  2. Right ventricular systolic function is normal. The right ventricular size is normal. Tricuspid regurgitation signal is inadequate for assessing PA pressure.  3. Left atrial size was moderately dilated.  4. The mitral valve is grossly normal. Trivial mitral valve regurgitation.  5. The aortic valve is tricuspid with restricted noncoronary cusp. There is mild calcification of the aortic valve. Aortic valve regurgitation is not visualized. Aortic valve sclerosis/calcification is present, without any evidence of aortic stenosis. Aortic valve mean gradient measures 4.0 mmHg.  6. The inferior vena cava is normal in size with greater than 50% respiratory variability, suggesting right atrial pressure of 3 mmHg. Comparison(s): No prior Echocardiogram. FINDINGS  Left Ventricle: Left ventricular ejection fraction, by estimation, is 55 to 60%. The left ventricle has normal function. The left ventricle has no regional wall motion abnormalities. The left ventricular internal cavity size was normal in  size. There is  moderate asymmetric left ventricular hypertrophy of the basal segment. Left ventricular diastolic parameters are consistent with Grade I diastolic dysfunction (impaired relaxation). Right Ventricle: The right ventricular size is normal. No increase in right ventricular wall thickness. Right ventricular systolic function is normal. Tricuspid regurgitation signal is inadequate for assessing PA pressure. Left Atrium: Left atrial size was  moderately dilated. Right Atrium: Right atrial size was normal in size. Pericardium: There is no evidence of pericardial effusion. Mitral Valve: The mitral valve is grossly normal. Trivial mitral valve regurgitation. Tricuspid Valve: The tricuspid valve is grossly normal. Tricuspid valve regurgitation is mild. Aortic Valve: The aortic valve is tricuspid. There is mild calcification of the aortic valve. There is mild aortic valve annular calcification. Aortic valve regurgitation is not visualized. Aortic valve sclerosis/calcification is present, without any evidence of aortic stenosis. Aortic valve mean gradient measures 4.0 mmHg. Aortic valve peak gradient measures 7.6 mmHg. Aortic valve area, by VTI measures 2.62 cm. Pulmonic Valve: The pulmonic valve was grossly normal. Pulmonic valve regurgitation is trivial. Aorta: The aortic root is normal in size and structure. Venous: The inferior vena cava is normal in size with greater than 50% respiratory variability, suggesting right atrial pressure of 3 mmHg. IAS/Shunts: No atrial level shunt detected by color flow Doppler.  LEFT VENTRICLE PLAX 2D LVIDd:         3.90 cm   Diastology LVIDs:         3.00 cm   LV e' medial:    5.77 cm/s LV PW:         1.10 cm   LV E/e' medial:  11.7 LV IVS:        1.50 cm   LV e' lateral:   6.42 cm/s LVOT diam:     2.00 cm   LV E/e' lateral: 10.5 LV SV:         61 LV SV Index:   34 LVOT Area:     3.14 cm  RIGHT VENTRICLE RV S prime:     10.00 cm/s TAPSE (M-mode): 2.0 cm LEFT ATRIUM              Index        RIGHT ATRIUM           Index LA Vol (A2C):   46.1 ml 25.76 ml/m  RA Area:     18.60 cm LA Vol (A4C):   90.5 ml 50.57 ml/m  RA Volume:   49.60 ml  27.72 ml/m LA Biplane Vol: 65.0 ml 36.32 ml/m  AORTIC VALVE AV Area (Vmax):    2.89 cm AV Area (Vmean):   2.62 cm AV Area (VTI):     2.62 cm AV Vmax:           138.00 cm/s AV Vmean:          97.600 cm/s AV VTI:            0.233 m AV Peak Grad:      7.6 mmHg AV Mean Grad:      4.0 mmHg LVOT Vmax:         127.00 cm/s LVOT Vmean:        81.400 cm/s LVOT VTI:          0.194 m LVOT/AV VTI ratio: 0.83  AORTA Ao Root diam: 3.30 cm MITRAL VALVE MV Area (PHT): 2.96 cm    SHUNTS MV Decel Time: 256 msec    Systemic VTI:  0.19 m MV E velocity: 67.70 cm/s  Systemic Diam: 2.00 cm MV A velocity: 70.70 cm/s MV E/A ratio:  0.96 Nona Dell MD Electronically signed by Nona Dell MD Signature Date/Time: 07/01/2023/12:03:21 PM    Final     Cardiac Studies   See above  Patient Profile     89 y.o. male admitted with Stokes Adams attack and 2:1 AV block  Assessment & Plan   1. Stokes Adams attack - he is conducting better but we will plan PPM tomorrow. NPO after midnight. 2. Confusion - resolved. 3. Dyslipidemia - continue statin.     For questions or updates, please contact [facility] Please consult [email or web address] for contact info under Cardiology/STEMI.      Signed, Lewayne Bunting, MD  07/03/2023, 9:02 AM

## 2023-07-04 ENCOUNTER — Encounter (HOSPITAL_COMMUNITY)
Admission: EM | Disposition: A | Discharge status: Home / Self Care | Payer: Self-pay | Source: Home / Self Care | Attending: Family Medicine

## 2023-07-04 DIAGNOSIS — R001 Bradycardia, unspecified: Secondary | ICD-10-CM | POA: Diagnosis not present

## 2023-07-04 DIAGNOSIS — I441 Atrioventricular block, second degree: Secondary | ICD-10-CM | POA: Diagnosis not present

## 2023-07-04 HISTORY — PX: PACEMAKER IMPLANT: EP1218

## 2023-07-04 LAB — CBC WITH DIFFERENTIAL/PLATELET
Abs Immature Granulocytes: 0.01 10*3/uL (ref 0.00–0.07)
Basophils Absolute: 0.1 10*3/uL (ref 0.0–0.1)
Basophils Relative: 1 %
Eosinophils Absolute: 0.2 10*3/uL (ref 0.0–0.5)
Eosinophils Relative: 3 %
HCT: 42.2 % (ref 39.0–52.0)
Hemoglobin: 13.4 g/dL (ref 13.0–17.0)
Immature Granulocytes: 0 %
Lymphocytes Relative: 16 %
Lymphs Abs: 1.1 10*3/uL (ref 0.7–4.0)
MCH: 30.1 pg (ref 26.0–34.0)
MCHC: 31.8 g/dL (ref 30.0–36.0)
MCV: 94.8 fL (ref 80.0–100.0)
Monocytes Absolute: 0.5 10*3/uL (ref 0.1–1.0)
Monocytes Relative: 7 %
Neutro Abs: 5.2 10*3/uL (ref 1.7–7.7)
Neutrophils Relative %: 73 %
Platelets: 236 10*3/uL (ref 150–400)
RBC: 4.45 MIL/uL (ref 4.22–5.81)
RDW: 15.5 % (ref 11.5–15.5)
WBC: 7.1 10*3/uL (ref 4.0–10.5)
nRBC: 0 % (ref 0.0–0.2)

## 2023-07-04 LAB — BASIC METABOLIC PANEL
Anion gap: 5 (ref 5–15)
BUN: 17 mg/dL (ref 8–23)
CO2: 26 mmol/L (ref 22–32)
Calcium: 8.9 mg/dL (ref 8.9–10.3)
Chloride: 105 mmol/L (ref 98–111)
Creatinine, Ser: 0.56 mg/dL — ABNORMAL LOW (ref 0.61–1.24)
GFR, Estimated: >60 mL/min (ref >60)
Glucose, Bld: 102 mg/dL — ABNORMAL HIGH (ref 70–99)
Potassium: 4.2 mmol/L (ref 3.5–5.1)
Sodium: 136 mmol/L (ref 135–145)

## 2023-07-04 LAB — HEPATIC FUNCTION PANEL
ALT: 128 U/L — ABNORMAL HIGH (ref 0–44)
AST: 471 U/L — ABNORMAL HIGH (ref 15–41)
Albumin: 3.3 g/dL — ABNORMAL LOW (ref 3.5–5.0)
Alkaline Phosphatase: 60 U/L (ref 38–126)
Bilirubin, Direct: 0.2 mg/dL (ref 0.0–0.2)
Indirect Bilirubin: 1.2 mg/dL — ABNORMAL HIGH (ref 0.3–0.9)
Total Bilirubin: 1.4 mg/dL — ABNORMAL HIGH (ref 0.3–1.2)
Total Protein: 6.1 g/dL — ABNORMAL LOW (ref 6.5–8.1)

## 2023-07-04 SURGERY — PACEMAKER IMPLANT
Anesthesia: LOCAL

## 2023-07-04 MED ORDER — LIDOCAINE HCL (PF) 1 % IJ SOLN
INTRAMUSCULAR | Status: DC | PRN
  Administered 2023-07-04: 30 mL

## 2023-07-04 MED ORDER — CEFAZOLIN SODIUM-DEXTROSE 2-4 GM/100ML-% IV SOLN
INTRAVENOUS | Status: AC
  Administered 2023-07-04: 2 g via INTRAVENOUS
  Filled 2023-07-04: qty 100

## 2023-07-04 MED ORDER — CHLORHEXIDINE GLUCONATE 4 % EX SOLN
60.0000 mL | Freq: Once | CUTANEOUS | Status: AC
Start: 2023-07-04 — End: 2023-07-04
  Administered 2023-07-04: 4 via TOPICAL

## 2023-07-04 MED ORDER — ONDANSETRON HCL 4 MG/2ML IJ SOLN: 4.0000 mg | Freq: Four times a day (QID) | INTRAMUSCULAR | Status: DC | PRN

## 2023-07-04 MED ORDER — ACETAMINOPHEN 325 MG PO TABS
325.0000 mg | ORAL_TABLET | ORAL | Status: DC | PRN
  Administered 2023-07-04: 650 mg via ORAL
  Filled 2023-07-04: qty 2

## 2023-07-04 MED ORDER — CEFAZOLIN SODIUM-DEXTROSE 1-4 GM/50ML-% IV SOLN
1.0000 g | Freq: Four times a day (QID) | INTRAVENOUS | Status: AC
Start: 2023-07-04 — End: 2023-07-05
  Administered 2023-07-04 – 2023-07-05 (×3): 1 g via INTRAVENOUS
  Filled 2023-07-04 (×3): qty 50

## 2023-07-04 MED ORDER — SODIUM CHLORIDE 0.9 % IV SOLN: INTRAVENOUS | Status: DC

## 2023-07-04 MED ORDER — HEPARIN (PORCINE) IN NACL 1000-0.9 UT/500ML-% IV SOLN
INTRAVENOUS | Status: DC | PRN
  Administered 2023-07-04: 500 mL

## 2023-07-04 MED ORDER — SODIUM CHLORIDE 0.9 % IV SOLN
INTRAVENOUS | Status: AC
  Administered 2023-07-04: 80 mg
  Filled 2023-07-04: qty 2

## 2023-07-04 MED ORDER — CHLORHEXIDINE GLUCONATE 4 % EX SOLN
60.0000 mL | Freq: Once | CUTANEOUS | Status: DC
  Administered 2023-07-04: 4 via TOPICAL

## 2023-07-04 SURGICAL SUPPLY — 12 items
CABLE SURGICAL S-101-97-12 (CABLE) ×1 IMPLANT
CATH RIGHTSITE C315HIS02 (CATHETERS) IMPLANT
IPG PACE AZUR XT DR MRI W1DR01 (Pacemaker) IMPLANT
LEAD CAPSURE NOVUS 5076-58CM (Lead) IMPLANT
LEAD SELECT SECURE 3830 383069 (Lead) IMPLANT
PACE AZURE XT DR MRI W1DR01 (Pacemaker) ×1 IMPLANT
PAD DEFIB RADIO PHYSIO CONN (PAD) ×1 IMPLANT
SELECT SECURE 3830 383069 (Lead) ×1 IMPLANT
SHEATH 7FR PRELUDE SNAP 13 (SHEATH) IMPLANT
SLITTER 6232ADJ (MISCELLANEOUS) IMPLANT
TRAY PACEMAKER INSERTION (PACKS) ×1 IMPLANT
WIRE HI TORQ VERSACORE-J 145CM (WIRE) IMPLANT

## 2023-07-04 NOTE — Progress Notes (Addendum)
Patient Name: Shane [name] Date of Encounter: 07/04/2023  Primary Cardiologist: Dina Rich, MD Electrophysiologist: None  Interval Summary   The patient is doing well today.  Son at bedside. At this time, the patient denies chest pain, shortness of breath, or any new concerns.  Vital Signs    Vitals:   07/04/23 0500 07/04/23 0530 07/04/23 0543 07/04/23 0600  BP:      Pulse: 80 (!) 45 76 74  Resp: 17 16 15 18   Temp:      TempSrc:      SpO2: 92% 91% 95% 94%  Weight:      Height:        Intake/Output Summary (Last 24 hours) at 07/04/2023 0821 Last data filed at 07/04/2023 0600 Gross per 24 hour  Intake 240 ml  Output --  Net 240 ml   Filed Weights   06/30/23 1259 07/01/23 1457  Weight: 68 kg 70.5 kg    Physical Exam    GEN- The patient is well appearing, alert and oriented x 3 today.   Lungs- Clear to ausculation bilaterally, normal work of breathing Cardiac- Regular currently in 80's rate and rhythm, no murmurs, rubs or gallops GI- soft, NT, ND, + BS Extremities- no clubbing or cyanosis. No edema  Telemetry    SR with 2:1 on admit, then Mobitz 1 (personally reviewed)  Studies:  ECHO 07/01/23 > LVEF 55-60%, no RWMA, G1DD, RV systolic function normal, LA moderately dilated   Arrhythmia / AAD Hx paroxysmal AFL > on coumadin at baseline Hx 2:1 AVB dating back to at least 2018   Hospital Course    Shane [name] is a 89 y.o. male with hx of prior bifascicular block / Mobitz 1 dating back to at least 2018, admitted 06/30/23 after a mechanical fall where he believes he remembers the fall / sister found him down in the driveway.  He crawled to his car & attempted to get up but was unable.  Initial EKG showed bradycardia with 2:1 AVB.  Tele review shows second degree AVB, Mobitz 1. Hospital course complicated by confusion that has since resolved. ECHO with LVEF 55-60% on admit.  Assessment & Plan    Symptomatic Bradycardia Second Degree AVB, Mobitz 1   Hx of Mobitz 1 dating back to 2018.   -NPO for possible PPM  -continue tele monitoring  -avoid AV nodal blocking agents -assess orthostatics  Hx Paroxysmal AFL  -on coumadin at baseline  -most recent INR 10/27 1.2    Transamanitis  -elevated AST on admit  -repeat LFT's -consider statin as culprit, tylenol on home med list -does drink wine occasionally during the week     Explained risks, benefits, and alternatives to PPM implantation, including but not limited to bleeding, infection, pneumothorax, pericardial effusion, lead dislodgement, heart attack, stroke, or death.  Pt verbalized understanding and agrees to proceed.   For questions or updates, please contact [facility] Please consult [email or web address] for contact info under Cardiology/STEMI.  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  It was not symptomatic then, and his current history is not convincing that StokesAdams is the mechanism of his syncope given his (alleged) awareness  of a fall-and the prolonged residua, manifested as difficulty in arising, ( acknowledging that it could simply be weakness in this 89 yo man)   Morevoer, AVnodal block is not frequently assoc with [facility].  Still it begs relief and we have discussed, as haveGT and GM pacing .  I have told him and the family that given the unconvincing story for arrhythmic syncope and its chronic nature it may not solve the spells.  The benefits and risks were reviewed including but not limited to death,  perforation, infection, lead dislodgement and device malfunction.  The patient understands agrees and is willing to proceed.  His  orthostatic intolerance manifested today may be 2/2 bed rest as opposed to primary problem.  Transaminases are elevated>> will recheck

## 2023-07-04 NOTE — Progress Notes (Signed)
HOSPITALIST ROUNDING NOTE Shane [name] VWU:[id number]  DOB: [birth year]  DOA: 06/30/2023  PCP: [name], MD  07/04/2023,4:21 PM   LOS: 3 days      Code Status: Full From: Home-lives alone  current Dispo: Unclear at this time    89 year old home dwelling white male CABG in 1990 Bradycardia requiring admission back in 2018 with AV block-event monitor showed 2-1 block only in the early morning hours-evaluation by Dr. Ladona Ridgel showed Mobitz 1-is on chronic anticoagulation secondary to a flutter Admitted [facility] 10/24 with a fall onto his elbows found down-found to have severe bradycardia Imaging bilateral knees lumbar spine head showed no specific findings CT neck severe left-sided neuroforaminal stenosis C5-C6 +5 cm L thyroid nodule White count 14 platelet 219 BUN/creatinine 24/0.5 INR 3.3 TSH 0.33  Transfer [facility] 10/25 for EP eval  10/25 echo EF = 55-60% moderate asymmetry LV grade 1 DD 10/26 ultrasound neck shows heterogeneous thyroid with 1.5T RADS 3 nodule requiring a follow-up in 1 year 10/28 PPM placed-medtronic  Plan  Symptomatic bradycardia 2/2 Mobitz 2-1 AVB with persistent falls PPM placed 10/28--postop care per EP  Underlying a flutter rhythm on chronic Coumadin Per EP Resume Coumadin when they ok this  HFpEF 10/25 echo Likely related to tachycardia, HTN etc.-not volume overloaded-only monitor Troponin mediated by arrhythmia -Would defer to cardiology given he has a history of CABG in 1990  Fall without major injury If further pain needs imaging-get OT to see additionally--he overall seems improved  CABG 1990 Continue atorvastatin 80  Thyroid nodule TSH is 0.3-5 cm left thyroid nodule merits characterization needs follow-up in 1 year  Left foraminal stenosis C5-C6 See above discussion-could have some foraminal stenosis causing pain in right arm  Depression Continue Lexapro 5-do not give anxiolytics or ID meds in a patient who is 89 years old-order  placed  BPH Flomax 0.4    DVT prophylaxis:  Resume as per EP  Status is: Inpatient Remains inpatient appropriate because:   Requires pacemaker likely   Subjective:   Seen this am and in the immediate post-op Some chest discomfort No fever  Objective + exam Vitals:   07/04/23 0543 07/04/23 0600 07/04/23 1253 07/04/23 1500  BP:   139/71   Pulse: 76 74 (!) 57   Resp: 15 18 18    Temp:   97.9 F (36.6 C)   TempSrc:   Oral   SpO2: 95% 94% 94% 97%  Weight:      Height:       Filed Weights   06/30/23 1259 07/01/23 1457  Weight: 68 kg 70.5 kg    Examination:  Looks fair more coherent  Some bruising to LUchest S1 s2 no m Chest is clear Abdomen soft no rebound no guarding    Data Reviewed: reviewed   CBC    Component Value Date/Time   WBC 7.1 07/04/2023 0652   RBC 4.45 07/04/2023 0652   HGB 13.4 07/04/2023 0652   HCT 42.2 07/04/2023 0652   PLT 236 07/04/2023 0652   MCV 94.8 07/04/2023 0652   MCH 30.1 07/04/2023 0652   MCHC 31.8 07/04/2023 0652   RDW 15.5 07/04/2023 0652   LYMPHSABS 1.1 07/04/2023 0652   MONOABS 0.5 07/04/2023 0652   EOSABS 0.2 07/04/2023 0652   BASOSABS 0.1 07/04/2023 0652      Latest Ref Rng & Units 07/04/2023   10:55 AM 07/04/2023    6:52 AM 07/03/2023    4:21 AM  CMP  Glucose 70 - 99 mg/dL  102  102   BUN 8 - 23 mg/dL  17  17   Creatinine 1.61 - 1.24 mg/dL  0.96  0.45   Sodium 409 - 145 mmol/L  136  138   Potassium 3.5 - 5.1 mmol/L  4.2  3.8   Chloride 98 - 111 mmol/L  105  106   CO2 22 - 32 mmol/L  26  26   Calcium 8.9 - 10.3 mg/dL  8.9  8.8   Total Protein 6.5 - 8.1 g/dL 6.1     Total Bilirubin 0.3 - 1.2 mg/dL 1.4     Alkaline Phos 38 - 126 U/L 60     AST 15 - 41 U/L 471     ALT 0 - 44 U/L 128        Scheduled Meds:  atorvastatin  80 mg Oral Daily   escitalopram  5 mg Oral QPM   tamsulosin  0.4 mg Oral QPC supper   Continuous Infusions:  Time  25  Rhetta Mura, MD  [facility]

## 2023-07-04 NOTE — Plan of Care (Signed)

## 2023-07-04 NOTE — Care Management Important Message (Signed)
Important Message  Patient Details  Name: Shane [name] MRN: [id number] Date of Birth: [birth year]   Important Message Given:  Yes - Medicare IM     Sherilyn Banker 07/04/2023, 4:32 PM

## 2023-07-05 ENCOUNTER — Encounter (HOSPITAL_COMMUNITY): Payer: Self-pay | Admitting: Internal Medicine

## 2023-07-05 ENCOUNTER — Inpatient Hospital Stay (HOSPITAL_COMMUNITY): Payer: Medicare HMO

## 2023-07-05 ENCOUNTER — Other Ambulatory Visit (HOSPITAL_COMMUNITY): Payer: Self-pay

## 2023-07-05 DIAGNOSIS — R001 Bradycardia, unspecified: Secondary | ICD-10-CM | POA: Diagnosis not present

## 2023-07-05 LAB — CBC WITH DIFFERENTIAL/PLATELET
Abs Immature Granulocytes: 0.02 10*3/uL (ref 0.00–0.07)
Basophils Absolute: 0 10*3/uL (ref 0.0–0.1)
Basophils Relative: 0 %
Eosinophils Absolute: 0.2 10*3/uL (ref 0.0–0.5)
Eosinophils Relative: 2 %
HCT: 40.4 % (ref 39.0–52.0)
Hemoglobin: 13.2 g/dL (ref 13.0–17.0)
Immature Granulocytes: 0 %
Lymphocytes Relative: 13 %
Lymphs Abs: 1 10*3/uL (ref 0.7–4.0)
MCH: 30.8 pg (ref 26.0–34.0)
MCHC: 32.7 g/dL (ref 30.0–36.0)
MCV: 94.2 fL (ref 80.0–100.0)
Monocytes Absolute: 0.6 10*3/uL (ref 0.1–1.0)
Monocytes Relative: 8 %
Neutro Abs: 6.1 10*3/uL (ref 1.7–7.7)
Neutrophils Relative %: 77 %
Platelets: 235 10*3/uL (ref 150–400)
RBC: 4.29 MIL/uL (ref 4.22–5.81)
RDW: 15.2 % (ref 11.5–15.5)
WBC: 7.9 10*3/uL (ref 4.0–10.5)
nRBC: 0 % (ref 0.0–0.2)

## 2023-07-05 LAB — BASIC METABOLIC PANEL
Anion gap: 7 (ref 5–15)
BUN: 20 mg/dL (ref 8–23)
CO2: 24 mmol/L (ref 22–32)
Calcium: 8.8 mg/dL — ABNORMAL LOW (ref 8.9–10.3)
Chloride: 105 mmol/L (ref 98–111)
Creatinine, Ser: 0.62 mg/dL (ref 0.61–1.24)
GFR, Estimated: >60 mL/min (ref >60)
Glucose, Bld: 100 mg/dL — ABNORMAL HIGH (ref 70–99)
Potassium: 4.3 mmol/L (ref 3.5–5.1)
Sodium: 136 mmol/L (ref 135–145)

## 2023-07-05 LAB — HEPATITIS B SURFACE ANTIGEN: Hepatitis B Surface Ag: NONREACTIVE

## 2023-07-05 MED ORDER — TRAMADOL HCL 50 MG PO TABS
50.0000 mg | ORAL_TABLET | Freq: Four times a day (QID) | ORAL | Status: DC | PRN
  Filled 2023-07-05: qty 1

## 2023-07-05 MED ORDER — ACETAMINOPHEN 500 MG PO TABS: 500.0000 mg | ORAL_TABLET | Freq: Four times a day (QID) | ORAL | Status: DC | PRN

## 2023-07-05 NOTE — Discharge Instructions (Addendum)
After Your Pacemaker   You have a Medtronic Pacemaker  ACTIVITY Do not lift your arm above shoulder height for 1 week after your procedure. After 7 days, you may progress as below.  You should remove your sling 24 hours after your procedure, unless otherwise instructed by your provider.     Tuesday July 12, 2023  Wednesday July 13, 2023 Thursday July 14, 2023 Friday July 15, 2023   Do not lift, push, pull, or carry anything over 10 pounds with the affected arm until 6 weeks (Tuesday August 16, 2023 ) after your procedure.   You may drive AFTER your wound check, unless you have been told otherwise by your provider.   Ask your healthcare provider when you can go back to work   INCISION/Dressing You should start your new blood thinner, Eliquis, on Sunday 07/10/23  Monitor your Pacemaker site for redness, swelling, and drainage. Call the device clinic at [phone] if you experience these symptoms or fever/chills.  If your incision is closed with Dermabond/Surgical glue. You may shower 1 day after your pacemaker implant and wash around the site with soap and water.    If you were discharged in a sling, please do not wear this during the day more than 48 hours after your surgery unless otherwise instructed. This may increase the risk of stiffness and soreness in your shoulder.   Avoid lotions, ointments, or perfumes over your incision until it is well-healed.  You may use a hot tub or a pool AFTER your wound check appointment if the incision is completely closed.  Pacemaker Alerts:  Some alerts are vibratory and others beep. These are NOT emergencies. Please call our office to let us know. If this occurs at night or on weekends, it can wait until the next business day. Send a remote transmission.  If your device is capable of reading fluid status (for heart failure), you will be offered monthly monitoring to review this with you.   DEVICE MANAGEMENT Remote monitoring is  used to monitor your pacemaker from home. This monitoring is scheduled every 91 days by our office. It allows [place] to keep an eye on the functioning of your device to ensure it is working properly. You will routinely see your Electrophysiologist annually (more often if necessary).   You should receive your ID card for your new device in 4-8 weeks. Keep this card with you at all times once received. Consider wearing a medical alert bracelet or necklace.  Your Pacemaker may be MRI compatible. This will be discussed at your next office visit/wound check.  You should avoid contact with strong electric or magnetic fields.   Do not use amateur (ham) radio equipment or electric (arc) welding torches. MP3 player headphones with magnets should not be used. Some devices are safe to use if held at least 12 inches (30 cm) from your Pacemaker. These include power tools, lawn mowers, and speakers. If you are unsure if something is safe to use, ask your health care provider.  When using your cell phone, hold it to the ear that is on the opposite side from the Pacemaker. Do not leave your cell phone in a pocket over the Pacemaker.  You may safely use electric blankets, heating pads, computers, and microwave ovens.  Call the office right away if: You have chest pain. You feel more short of breath than you have felt before. You feel more light-headed than you have felt before. Your incision starts to open up.  This  information is not intended to replace advice given to you by your health care provider. Make sure you discuss any questions you have with your health care provider.

## 2023-07-05 NOTE — Progress Notes (Signed)
Granddaughter, Edson Snowball [phone]), called and reviewed wound care instructions, arm restrictions for patient post PPM implant.      Canary Brim, MSN, APRN, NP-C, AGACNP-BC [facility] - Electrophysiology  07/05/2023, 3:32 PM

## 2023-07-05 NOTE — Progress Notes (Signed)
Occupational Therapy Treatment Patient Details Name: Shane [name] MRN: [id number] DOB: [birth year] Today's Date: 07/05/2023   History of present illness Pt is an 89yo male presenting to [facility] after a mechanical fall onto his elbows and found down. CT neck showed severe left-sided neuroforaminal stenosis C5-C6 +5 cm L thyroid nodule, all other imaging negative for acute findings. PPM placed 10/28.    PMH: BPH, bradycardia, HLD, AV Block type 1, polymyalgia rheumatica, lumbar disc surgery, CABG.   OT comments  Pt seen for first OT session s/p PPM 10/28. Provided handout and initial education for precautions for ADLs and transfers though pt still benefits from cues to implement. Overall, pt requires Min A to stand but once up, able to mobilize well in hallway without AD. Pt does require Min A for ADLs currently. Anticipate quick improvements acutely w/ plans to further address ADLs w/ pacemaker precautions.      If plan is discharge home, recommend the following:  A little help with bathing/dressing/bathroom;Assist for transportation   Equipment Recommendations  BSC/3in1 (for use over toilet)    Recommendations for Other Services      Precautions / Restrictions Precautions Precautions: Fall;ICD/Pacemaker Precaution Comments: recent fall Restrictions Weight Bearing Restrictions: No       Mobility Bed Mobility               General bed mobility comments: in chair on entry    Transfers Overall transfer level: Needs assistance Equipment used: None Transfers: Sit to/from Stand Sit to Stand: Min assist           General transfer comment: to stand from recliner, cues to avoid pushing through LUE     Balance Overall balance assessment: History of Falls, No apparent balance deficits (not formally assessed)                                         ADL either performed or assessed with clinical judgement   ADL Overall ADL's : Needs assistance/impaired                      Lower Body Dressing: Minimal assistance;Sitting/lateral leans;Sit to/from stand Lower Body Dressing Details (indicate cue type and reason): assist needed to don tennis shoes d/t pt reporting soreness when bending to feet and noted difficulty getting heel in B shoes             Functional mobility during ADLs: Contact guard assist General ADL Comments: emphasis on initial education on PPM prec w/ pt also eager to mobilize in hallway without AD, no overt LOB though pt reaching to hallway rail for confidence    Extremity/Trunk Assessment Upper Extremity Assessment Upper Extremity Assessment: Overall WFL for tasks assessed;Right hand dominant;LUE deficits/detail LUE Deficits / Details: s/p PPM placement. able to bend elbow WFL, minor edema noted. LUE Coordination: decreased gross motor   Lower Extremity Assessment Lower Extremity Assessment: Defer to PT evaluation        Vision   Vision Assessment?: No apparent visual deficits   Perception     Praxis      Cognition Arousal: Alert Behavior During Therapy: WFL for tasks assessed/performed Overall Cognitive Status: Impaired/Different from baseline Area of Impairment: Memory                     Memory: Decreased recall of precautions  General Comments: does need cues for PPM precautions but asks appropriate questions in regards to handout provided        Exercises      Shoulder Instructions       General Comments      Pertinent Vitals/ Pain       Pain Assessment Pain Assessment: Faces Faces Pain Scale: Hurts a little bit Pain Location: L UE, generalized after fall prior to admission Pain Descriptors / Indicators: Sore Pain Intervention(s): Monitored during session  Home Living                                          Prior Functioning/Environment              Frequency  Min 1X/week        Progress Toward Goals  OT Goals(current goals  can now be found in the care plan section)  Progress towards OT goals: Progressing toward goals  Acute Rehab OT Goals Patient Stated Goal: home soon, get back to walking miles OT Goal Formulation: With patient/family Time For Goal Achievement: 07/17/23 Potential to Achieve Goals: Good ADL Goals Pt Will Perform Upper Body Bathing: with caregiver independent in assisting;with modified independence Pt Will Perform Upper Body Dressing: with modified independence;sitting Pt Will Perform Lower Body Dressing: with modified independence;sit to/from stand Pt Will Transfer to Toilet: with modified independence;ambulating;bedside commode Additional ADL Goal #1: Pt will verbalize 3 strategies to use during ADL adn mobility regarding post pacemaker care  Plan      Co-evaluation                 AM-PAC OT "6 Clicks" Daily Activity     Outcome Measure   Help from another person eating meals?: None Help from another person taking care of personal grooming?: None Help from another person toileting, which includes using toliet, bedpan, or urinal?: A Little Help from another person bathing (including washing, rinsing, drying)?: A Little Help from another person to put on and taking off regular upper body clothing?: None Help from another person to put on and taking off regular lower body clothing?: A Little 6 Click Score: 21    End of Session Equipment Utilized During Treatment: Gait belt  OT Visit Diagnosis: Unsteadiness on feet (R26.81);History of falling (Z91.81)   Activity Tolerance Patient tolerated treatment well   Patient Left in chair;with call bell/phone within reach;with chair alarm set;with family/visitor present   Nurse Communication Mobility status        Time: 1610-9604 OT Time Calculation (min): 27 min  Charges: OT General Charges $OT Visit: 1 Visit OT Treatments $Self Care/Home Management : 8-22 mins $Therapeutic Activity: 8-22 mins  Bradd Canary, OTR/L Acute Rehab  Services Office: [phone]   Lorre Munroe 07/05/2023, 11:44 AM

## 2023-07-05 NOTE — Progress Notes (Signed)
HOSPITALIST ROUNDING NOTE Shane [name] YQM:[id number]  DOB: 1932/11/11  DOA: 06/30/2023  PCP: [name], MD  07/05/2023,11:33 AM   LOS: 4 days      Code Status: Full From: Home-lives alone  current Dispo: Unclear at this time    89 year old home dwelling white male CABG in 1990 Bradycardia requiring admission back in 2018 with AV block-event monitor showed 2-1 block only in the early morning hours-evaluation by Dr. Ladona Ridgel showed Mobitz 1-is on chronic anticoagulation secondary to a flutter Admitted [facility] 10/24 with a fall onto his elbows found down-found to have severe bradycardia Imaging bilateral knees lumbar spine head showed no specific findings CT neck severe left-sided neuroforaminal stenosis C5-C6 +5 cm L thyroid nodule White count 14 platelet 219 BUN/creatinine 24/0.5 INR 3.3 TSH 0.33  Transfer [facility]-->[facility] 10/25 for EP eval  10/25 echo EF = 55-60% moderate asymmetry LV grade 1 DD 10/26 ultrasound neck shows heterogeneous thyroid with 1.5T RADS 3 nodule requiring a follow-up in 1 year 10/28 PPM placed-medtronic  Plan  Symptomatic bradycardia 2/2 Mobitz 2-1 AVB with persistent falls Underlying a flutter previously on Coumadin PPM placed 10/28--postop care, anticoagulation per EP Would use very low dose tylenol firs tchopice pain, would use trmadol 2nd choice [until we figure out etiology of live injury]  HFpEF 10/25 echo Likely related to tachycardia, HTN etc.-not volume overloaded-only monitor Troponin mediated by arrhythmia -Would defer to cardiology given he has a history of CABG in 1990  Fall without major injury If further pain needs imaging-get OT to see additionally--seems improved  CABG 1990 Atorvastatin held-see below  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--patient admits to ~ 1 bottle wine /week  over many years take only 1-2 tabs tylenol when prn--no supplements at home--granddaughter adds was taking [mistakenly] his wife's meds  several weeks ago Get viral serologies hepatitis --get abdomen pelvis ultrasound As no prior real LFt elevation [last labs 2012] feel reasonable to r'o all other causes]  Thyroid nodule TSH is 0.3-5 cm left thyroid nodule merits characterization needs follow-up in 1 year  Left foraminal stenosis C5-C6 See above discussion-could have some foraminal stenosis causing pain in right arm  Depression Continue Lexapro 5-do not give anxiolytics or ID meds in a patient who is 89 years old-order placed  BPH Prior Bladder ca with resection Dr. Retta Diones in the past Flomax 0.4 OP surveillance   DVT prophylaxis:  Resume as per EP  Status is: Inpatient Remains inpatient appropriate because:   Requires pacemaker likely   Subjective:  Pain controlled-no distress  No fever See above re: liver    Objective + exam Vitals:   07/04/23 1715 07/04/23 1735 07/04/23 1937 07/05/23 0340  BP: (!) 148/89 137/86 118/88 128/65  Pulse: 91 89 62 77  Resp: 19 12 16 15   Temp:   98 F (36.7 C) 98.4 F (36.9 C)  TempSrc:   Oral Oral  SpO2:   94% 93%  Weight:      Height:       Filed Weights   06/30/23 1259 07/01/23 1457  Weight: 68 kg 70.5 kg    Examination:  Looks fair more coherent  Some bruising to LUchest--looks stable and clean S1 s2 no m Chest is clear Abdomen soft no rebound no guarding--cannot appreicate HSM No le edema    Data Reviewed: reviewed   CBC    Component Value Date/Time   WBC 7.9 07/05/2023 0445   RBC 4.29 07/05/2023 0445   HGB 13.2 07/05/2023 0445  HCT 40.4 07/05/2023 0445   PLT 235 07/05/2023 0445   MCV 94.2 07/05/2023 0445   MCH 30.8 07/05/2023 0445   MCHC 32.7 07/05/2023 0445   RDW 15.2 07/05/2023 0445   LYMPHSABS 1.0 07/05/2023 0445   MONOABS 0.6 07/05/2023 0445   EOSABS 0.2 07/05/2023 0445   BASOSABS 0.0 07/05/2023 0445      Latest Ref Rng & Units 07/05/2023    4:45 AM 07/04/2023   10:55 AM 07/04/2023    6:52 AM  CMP  Glucose 70 - 99 mg/dL  161   096   BUN 8 - 23 mg/dL 20   17   Creatinine 0.45 - 1.24 mg/dL 4.09   8.11   Sodium 914 - 145 mmol/L 136   136   Potassium 3.5 - 5.1 mmol/L 4.3   4.2   Chloride 98 - 111 mmol/L 105   105   CO2 22 - 32 mmol/L 24   26   Calcium 8.9 - 10.3 mg/dL 8.8   8.9   Total Protein 6.5 - 8.1 g/dL  6.1    Total Bilirubin 0.3 - 1.2 mg/dL  1.4    Alkaline Phos 38 - 126 U/L  60    AST 15 - 41 U/L  471    ALT 0 - 44 U/L  128       Scheduled Meds:  atorvastatin  80 mg Oral Daily   escitalopram  5 mg Oral QPM   tamsulosin  0.4 mg Oral QPC supper   Continuous Infusions:  Time  45  Rhetta Mura, MD  [facility]

## 2023-07-05 NOTE — Progress Notes (Signed)
  Patient Name: Shane [name] Date of Encounter: 07/05/2023  Primary Cardiologist: Dina Rich, MD Electrophysiologist: None  Interval Summary   The patient reports he got up to go to the bathroom and felt very tired. Needed assistance to get back to bed.   At this time, the patient denies chest pain, shortness of breath, or any new concerns.  Vital Signs    Vitals:   07/04/23 1715 07/04/23 1735 07/04/23 1937 07/05/23 0340  BP: (!) 148/89 137/86 118/88 128/65  Pulse: 91 89 62 77  Resp: 19 12 16 15   Temp:   98 F (36.7 C) 98.4 F (36.9 C)  TempSrc:   Oral Oral  SpO2:   94% 93%  Weight:      Height:        Intake/Output Summary (Last 24 hours) at 07/05/2023 0806 Last data filed at 07/05/2023 0158 Gross per 24 hour  Intake 685.61 ml  Output 445 ml  Net 240.61 ml   Filed Weights   06/30/23 1259 07/01/23 1457  Weight: 68 kg 70.5 kg    Physical Exam    GEN- The patient is well appearing, alert and oriented x 3 today.   Lungs- Clear to ausculation bilaterally, normal work of breathing Cardiac- Regular rate and rhythm, no murmurs, rubs or gallops GI- soft, NT, ND, + BS Extremities- no clubbing or cyanosis. No edema  Telemetry    V-paced 70's (personally reviewed)  Studies:  ECHO 07/01/23 > LVEF 55-60%, no RWMA, G1DD, RV systolic function normal, LA moderately dilated  CXR 10/29 > personal review, no PTX, PPM implant with leads in good position   Arrhythmia / AAD Hx paroxysmal AFL > on coumadin at baseline Hx 2:1 AVB dating back to at least 2018  Device MDT Dual Chamber PPM implant 07/04/23 for bradycardia due to AVB. RV lead in LB position    Hospital Course    DEMONTRAY Shane [name] is a 89 y.o. male with hx of prior bifascicular block / Mobitz 1 dating back to at least 2018, admitted 06/30/23 after a mechanical fall where he believes he remembers the fall / sister found him down in the driveway.  He crawled to his car & attempted to get up but was unable.   Initial EKG showed bradycardia with 2:1 AVB.  Tele review shows second degree AVB, Mobitz 1. Hospital course complicated by confusion that has since resolved. ECHO with LVEF 55-60% on admit.   Assessment & Plan    Symptomatic Bradycardia Second Degree AVB, Mobitz 1  Hx of Mobitz 1 dating back to 2018.   -PPM arm restrictions reviewed with patient and family  -tele montioring  -orthostatics ordered, do not see in chart as completed    Hx Paroxysmal AFL  -plan to transition to Eliquis in 5 days    Transamanitis  -elevated AST on admit  -repeat LFT's, follow trend / reviewed with [facility] -consider statin as culprit, tylenol on home med list -does drink wine occasionally during the week     For questions or updates, please contact [facility] Please consult [email or web address] for contact info under Cardiology/STEMI.  Signed, Canary Brim, MSN, APRN, NP-C, AGACNP-BC [facility] - Electrophysiology  07/05/2023, 9:39 AM

## 2023-07-05 NOTE — TOC Benefit Eligibility Note (Signed)
Patient Product/process development scientist completed.    The patient is insured through [employer]. Patient has Medicare and is not eligible for a copay card, but may be able to apply for patient assistance, if available.    Ran test claim for Eliquis 5 mg and the current 30 day co-pay is $47.00.   This test claim was processed through [facility]- copay amounts may vary at other pharmacies due to pharmacy/plan contracts, or as the patient moves through the different stages of their insurance plan.     Roland Earl, CPHT Pharmacy Technician III Certified Patient Advocate [facility] Patient Advocate Team Direct Number: [phone]  Fax: [phone]

## 2023-07-06 DIAGNOSIS — R001 Bradycardia, unspecified: Secondary | ICD-10-CM | POA: Diagnosis not present

## 2023-07-06 LAB — HEPATIC FUNCTION PANEL
ALT: 96 U/L — ABNORMAL HIGH (ref 0–44)
AST: 371 U/L — ABNORMAL HIGH (ref 15–41)
Albumin: 3.2 g/dL — ABNORMAL LOW (ref 3.5–5.0)
Alkaline Phosphatase: 62 U/L (ref 38–126)
Bilirubin, Direct: 0.2 mg/dL (ref 0.0–0.2)
Indirect Bilirubin: 0.8 mg/dL (ref 0.3–0.9)
Total Bilirubin: 1 mg/dL (ref 0.3–1.2)
Total Protein: 6.3 g/dL — ABNORMAL LOW (ref 6.5–8.1)

## 2023-07-06 LAB — HCV AB W REFLEX TO QUANT PCR: HCV Ab: NONREACTIVE

## 2023-07-06 LAB — HCV INTERPRETATION

## 2023-07-06 MED ORDER — ACETAMINOPHEN 500 MG PO TABS: 500.0000 mg | ORAL_TABLET | Freq: Four times a day (QID) | ORAL | 0 refills | Status: AC | PRN

## 2023-07-06 MED ORDER — APIXABAN 5 MG PO TABS: 5.0000 mg | ORAL_TABLET | Freq: Two times a day (BID) | ORAL | 1 refills | Status: AC

## 2023-07-06 MED ORDER — TAMSULOSIN HCL 0.4 MG PO CAPS: 0.4000 mg | ORAL_CAPSULE | Freq: Every day | ORAL | 3 refills | Status: AC

## 2023-07-06 MED ORDER — APIXABAN 5 MG PO TABS: 5.0000 mg | ORAL_TABLET | Freq: Two times a day (BID) | ORAL | 1 refills | Status: DC

## 2023-07-06 NOTE — Plan of Care (Signed)
  Problem: Health Behavior/Discharge Planning: Goal: Ability to manage health-related needs will improve Outcome: Progressing   Problem: Clinical Measurements: Goal: Ability to maintain clinical measurements within normal limits will improve Outcome: Progressing Goal: Respiratory complications will improve Outcome: Progressing Goal: Cardiovascular complication will be avoided Outcome: Progressing   Problem: Activity: Goal: Risk for activity intolerance will decrease Outcome: Progressing   Problem: Nutrition: Goal: Adequate nutrition will be maintained Outcome: Progressing   Problem: Elimination: Goal: Will not experience complications related to bowel motility Outcome: Progressing Goal: Will not experience complications related to urinary retention Outcome: Progressing   Problem: Safety: Goal: Ability to remain free from injury will improve Outcome: Progressing   Problem: Cardiac: Goal: Ability to achieve and maintain adequate cardiopulmonary perfusion will improve Outcome: Progressing

## 2023-07-06 NOTE — Discharge Summary (Signed)
Physician Discharge Summary  Shane [name] MVH:[id number] DOB: 1933-06-21 DOA: 06/30/2023  PCP: [name], MD  Admit date: 06/30/2023 Discharge date: 07/06/2023  Time spent: 40 minutes  Recommendations for Outpatient Follow-up:  Follow outpatient CBC/CMP  Follow with cardiology outpatient - start eliquis 11/3 Follow repeat CXR outpatient - bilateral effusions noted 10/29 here, asymptomatic - follow volume/symptoms outpatient Elevated liver enzymes -> encourage etoh moderation/cessation, additional workup if persistently elevated (lipitor currently on hold) Needs repeat TSH and free T4 outpatient (needs thyroid US in 1 year to follow thyroid nodule) Severe L sided neural foraminal stenosis at C5-6, consider nsgy follow up if symptomatic  Discharge Diagnoses:  Principal Problem:   Bradycardia Active Problems:   Polymyalgia rheumatica (HCC)   BPH (benign prostatic hyperplasia)   Arteriosclerotic cardiovascular disease (ASCVD)   Hyperlipidemia   Chronic anticoagulation   Atrial flutter, paroxysmal (HCC)   Borderline hypertension   Mobitz type 2 second degree heart block   Discharge Condition: stable  Diet recommendation: heart healthy  Filed Weights   06/30/23 1259 07/01/23 1457  Weight: 68 kg 70.5 kg    History of present illness:   89 yo with hx CABG in [address], bradycardia (hx 2:1 block), chronic anticoagulation with hx atrial flutter who presented to [facility] on 10/24 after Shane [name] fall.  He was found to have severe bradycardia. He was transferred to [facility] for EP eval.  He's now s/p pacemaker placement on 10/28.  Stable for discharge, family to pursue long term care outpatient at [facility].    See below and prior notes for additional details   Hospital Course:  Assessment and Plan:  Symptomatic bradycardia  Second Degree AVB, Mobitz 1 History of Atrial Flutter S/p PPM placement on 10/28  Post op care per cardiology Resume eliquis for anticoagulation on 11/3    HFpEF   Hx CABG Asymptomatic, appears euvolemic Note CXR 10/29 with bilateral effusions and hazy opacities at bilateral lung bases -> appears euvolemic, he has no symptoms -> recommend repeat CXR outpatient    Mechanical Fall  No acute findings on imaging Continue therapy   CABG 1990 Atorvastatin held due to liver injury    Elevated Liver Enzymes Elevated AST/ALT Encourage cessation/moderation of drinking RUQ unrevealing ? Hemodynamically mediated with bradycardia? Follow outpatient   Thyroid nodule TSH mildly low - repeat outpatient    Left foraminal stenosis C5-C6 Follow outpatient   Depression Continue Lexapro 5   BPH Prior Bladder ca with resection Dr. Retta Diones in the past Flomax 0.4  Quant gold for [place] pending     Procedures: 10/28 Procedure dual pacemaker implantation    Echo IMPRESSIONS     1. Left ventricular ejection fraction, by estimation, is 55 to 60%. The  left ventricle has normal function. The left ventricle has no regional  wall motion abnormalities. There is moderate asymmetric left ventricular  hypertrophy of the basal segment.  Left ventricular diastolic parameters are consistent with Grade I  diastolic dysfunction (impaired relaxation).   2. Right ventricular systolic function is normal. The right ventricular  size is normal. Tricuspid regurgitation signal is inadequate for assessing  PA pressure.   3. Left atrial size was moderately dilated.   4. The mitral valve is grossly normal. Trivial mitral valve  regurgitation.   5. The aortic valve is tricuspid with restricted noncoronary cusp. There  is mild calcification of the aortic valve. Aortic valve regurgitation is  not visualized. Aortic valve sclerosis/calcification is present, without  any evidence of aortic stenosis.  Physician Discharge Summary  Shane [name] MVH:[id number] DOB: 1933-06-21 DOA: 06/30/2023  PCP: [name], MD  Admit date: 06/30/2023 Discharge date: 07/06/2023  Time spent: 40 minutes  Recommendations for Outpatient Follow-up:  Follow outpatient CBC/CMP  Follow with cardiology outpatient - start eliquis 11/3 Follow repeat CXR outpatient - bilateral effusions noted 10/29 here, asymptomatic - follow volume/symptoms outpatient Elevated liver enzymes -> encourage etoh moderation/cessation, additional workup if persistently elevated (lipitor currently on hold) Needs repeat TSH and free T4 outpatient (needs thyroid US in 1 year to follow thyroid nodule) Severe L sided neural foraminal stenosis at C5-6, consider nsgy follow up if symptomatic  Discharge Diagnoses:  Principal Problem:   Bradycardia Active Problems:   Polymyalgia rheumatica (HCC)   BPH (benign prostatic hyperplasia)   Arteriosclerotic cardiovascular disease (ASCVD)   Hyperlipidemia   Chronic anticoagulation   Atrial flutter, paroxysmal (HCC)   Borderline hypertension   Mobitz type 2 second degree heart block   Discharge Condition: stable  Diet recommendation: heart healthy  Filed Weights   06/30/23 1259 07/01/23 1457  Weight: 68 kg 70.5 kg    History of present illness:   89 yo with hx CABG in [address], bradycardia (hx 2:1 block), chronic anticoagulation with hx atrial flutter who presented to [facility] on 10/24 after Shane [name] fall.  He was found to have severe bradycardia. He was transferred to [facility] for EP eval.  He's now s/p pacemaker placement on 10/28.  Stable for discharge, family to pursue long term care outpatient at [facility].    See below and prior notes for additional details   Hospital Course:  Assessment and Plan:  Symptomatic bradycardia  Second Degree AVB, Mobitz 1 History of Atrial Flutter S/p PPM placement on 10/28  Post op care per cardiology Resume eliquis for anticoagulation on 11/3    HFpEF   Hx CABG Asymptomatic, appears euvolemic Note CXR 10/29 with bilateral effusions and hazy opacities at bilateral lung bases -> appears euvolemic, he has no symptoms -> recommend repeat CXR outpatient    Mechanical Fall  No acute findings on imaging Continue therapy   CABG 1990 Atorvastatin held due to liver injury    Elevated Liver Enzymes Elevated AST/ALT Encourage cessation/moderation of drinking RUQ unrevealing ? Hemodynamically mediated with bradycardia? Follow outpatient   Thyroid nodule TSH mildly low - repeat outpatient    Left foraminal stenosis C5-C6 Follow outpatient   Depression Continue Lexapro 5   BPH Prior Bladder ca with resection Dr. Retta Diones in the past Flomax 0.4  Quant gold for [place] pending     Procedures: 10/28 Procedure dual pacemaker implantation    Echo IMPRESSIONS     1. Left ventricular ejection fraction, by estimation, is 55 to 60%. The  left ventricle has normal function. The left ventricle has no regional  wall motion abnormalities. There is moderate asymmetric left ventricular  hypertrophy of the basal segment.  Left ventricular diastolic parameters are consistent with Grade I  diastolic dysfunction (impaired relaxation).   2. Right ventricular systolic function is normal. The right ventricular  size is normal. Tricuspid regurgitation signal is inadequate for assessing  PA pressure.   3. Left atrial size was moderately dilated.   4. The mitral valve is grossly normal. Trivial mitral valve  regurgitation.   5. The aortic valve is tricuspid with restricted noncoronary cusp. There  is mild calcification of the aortic valve. Aortic valve regurgitation is  not visualized. Aortic valve sclerosis/calcification is present, without  any evidence of aortic stenosis.  measures 1.4 x 1.5 x 1.1 cm. Margins are ill-defined. No calcifications or suspicious features. Findings are consistent with TI-RADS category 3. *Given size (>/= 1.5 - 2.4 cm) and appearance, Shane [name] follow-up ultrasound in 1 year should be considered based on TI-RADS criteria. IMPRESSION: 1. Mildly enlarged and heterogeneous thyroid gland. 2. Solitary 1.5 cm TI-RADS category 3 nodule in the left mid to lower gland meets criteria for imaging surveillance. Recommend follow-up ultrasound in 1 year. The above is in keeping with the ACR TI-RADS recommendations - J Am Coll Radiol 2017;14:587-595. Electronically Signed   By: Malachy Moan M.D.   On: 07/03/2023 07:27   ECHOCARDIOGRAM COMPLETE  Result Date: 07/01/2023    ECHOCARDIOGRAM REPORT   Patient Name:   Shane [name] Date of Exam: 07/01/2023 Medical Rec #:  [id number]      Height:       67.0 in Accession #:    [id number]     Weight:       150.0 lb Date of Birth:  16-May-1933     BSA:          1.790 m Patient Age:    89 years       BP:           141/66  mmHg Patient Gender: M              HR:           60 bpm. [facility] Procedure: 2D Echo, Cardiac Doppler and Color Doppler Indications:    CAD Native Vessel I25.10  History:        Patient has no prior history of Echocardiogram examinations.                 CAD, Prior CABG; Arrythmias:Atrial Flutter.  Sonographer:    Darlys Gales Referring Phys: [phone] CARLOS MADERA IMPRESSIONS  1. Left ventricular ejection fraction, by estimation, is 55 to 60%. The left ventricle has normal function. The left ventricle has no regional wall motion abnormalities. There is moderate asymmetric left ventricular hypertrophy of the basal segment. Left ventricular diastolic parameters are consistent with Grade I diastolic dysfunction (impaired relaxation).  2. Right ventricular systolic function is normal. The right ventricular size is normal. Tricuspid regurgitation signal is inadequate for assessing PA pressure.  3. Left atrial size was moderately dilated.  4. The mitral valve is grossly normal. Trivial mitral valve regurgitation.  5. The aortic valve is tricuspid with restricted noncoronary cusp. There is mild calcification of the aortic valve. Aortic valve regurgitation is not visualized. Aortic valve sclerosis/calcification is present, without any evidence of aortic stenosis. Aortic valve mean gradient measures 4.0 mmHg.  6. The inferior vena cava is normal in size with greater than 50% respiratory variability, suggesting right atrial pressure of 3 mmHg. Comparison(s): No prior Echocardiogram. FINDINGS  Left Ventricle: Left ventricular ejection fraction, by estimation, is 55 to 60%. The left ventricle has normal function. The left ventricle has no regional wall motion abnormalities. The left ventricular internal cavity size was normal in size. There is  moderate asymmetric left ventricular hypertrophy of the basal segment. Left ventricular diastolic parameters are consistent with Grade I diastolic dysfunction (impaired  relaxation). Right Ventricle: The right ventricular size is normal. No increase in right ventricular wall thickness. Right ventricular systolic function is normal. Tricuspid regurgitation signal is inadequate for assessing PA pressure. Left Atrium: Left atrial size was moderately dilated. Right Atrium: Right atrial size was normal in size. Pericardium: There is  measures 1.4 x 1.5 x 1.1 cm. Margins are ill-defined. No calcifications or suspicious features. Findings are consistent with TI-RADS category 3. *Given size (>/= 1.5 - 2.4 cm) and appearance, Shane [name] follow-up ultrasound in 1 year should be considered based on TI-RADS criteria. IMPRESSION: 1. Mildly enlarged and heterogeneous thyroid gland. 2. Solitary 1.5 cm TI-RADS category 3 nodule in the left mid to lower gland meets criteria for imaging surveillance. Recommend follow-up ultrasound in 1 year. The above is in keeping with the ACR TI-RADS recommendations - J Am Coll Radiol 2017;14:587-595. Electronically Signed   By: Malachy Moan M.D.   On: 07/03/2023 07:27   ECHOCARDIOGRAM COMPLETE  Result Date: 07/01/2023    ECHOCARDIOGRAM REPORT   Patient Name:   Shane [name] Date of Exam: 07/01/2023 Medical Rec #:  [id number]      Height:       67.0 in Accession #:    [id number]     Weight:       150.0 lb Date of Birth:  16-May-1933     BSA:          1.790 m Patient Age:    89 years       BP:           141/66  mmHg Patient Gender: M              HR:           60 bpm. [facility] Procedure: 2D Echo, Cardiac Doppler and Color Doppler Indications:    CAD Native Vessel I25.10  History:        Patient has no prior history of Echocardiogram examinations.                 CAD, Prior CABG; Arrythmias:Atrial Flutter.  Sonographer:    Darlys Gales Referring Phys: [phone] CARLOS MADERA IMPRESSIONS  1. Left ventricular ejection fraction, by estimation, is 55 to 60%. The left ventricle has normal function. The left ventricle has no regional wall motion abnormalities. There is moderate asymmetric left ventricular hypertrophy of the basal segment. Left ventricular diastolic parameters are consistent with Grade I diastolic dysfunction (impaired relaxation).  2. Right ventricular systolic function is normal. The right ventricular size is normal. Tricuspid regurgitation signal is inadequate for assessing PA pressure.  3. Left atrial size was moderately dilated.  4. The mitral valve is grossly normal. Trivial mitral valve regurgitation.  5. The aortic valve is tricuspid with restricted noncoronary cusp. There is mild calcification of the aortic valve. Aortic valve regurgitation is not visualized. Aortic valve sclerosis/calcification is present, without any evidence of aortic stenosis. Aortic valve mean gradient measures 4.0 mmHg.  6. The inferior vena cava is normal in size with greater than 50% respiratory variability, suggesting right atrial pressure of 3 mmHg. Comparison(s): No prior Echocardiogram. FINDINGS  Left Ventricle: Left ventricular ejection fraction, by estimation, is 55 to 60%. The left ventricle has normal function. The left ventricle has no regional wall motion abnormalities. The left ventricular internal cavity size was normal in size. There is  moderate asymmetric left ventricular hypertrophy of the basal segment. Left ventricular diastolic parameters are consistent with Grade I diastolic dysfunction (impaired  relaxation). Right Ventricle: The right ventricular size is normal. No increase in right ventricular wall thickness. Right ventricular systolic function is normal. Tricuspid regurgitation signal is inadequate for assessing PA pressure. Left Atrium: Left atrial size was moderately dilated. Right Atrium: Right atrial size was normal in size. Pericardium: There is  Physician Discharge Summary  Shane [name] MVH:[id number] DOB: 1933-06-21 DOA: 06/30/2023  PCP: [name], MD  Admit date: 06/30/2023 Discharge date: 07/06/2023  Time spent: 40 minutes  Recommendations for Outpatient Follow-up:  Follow outpatient CBC/CMP  Follow with cardiology outpatient - start eliquis 11/3 Follow repeat CXR outpatient - bilateral effusions noted 10/29 here, asymptomatic - follow volume/symptoms outpatient Elevated liver enzymes -> encourage etoh moderation/cessation, additional workup if persistently elevated (lipitor currently on hold) Needs repeat TSH and free T4 outpatient (needs thyroid US in 1 year to follow thyroid nodule) Severe L sided neural foraminal stenosis at C5-6, consider nsgy follow up if symptomatic  Discharge Diagnoses:  Principal Problem:   Bradycardia Active Problems:   Polymyalgia rheumatica (HCC)   BPH (benign prostatic hyperplasia)   Arteriosclerotic cardiovascular disease (ASCVD)   Hyperlipidemia   Chronic anticoagulation   Atrial flutter, paroxysmal (HCC)   Borderline hypertension   Mobitz type 2 second degree heart block   Discharge Condition: stable  Diet recommendation: heart healthy  Filed Weights   06/30/23 1259 07/01/23 1457  Weight: 68 kg 70.5 kg    History of present illness:   89 yo with hx CABG in [address], bradycardia (hx 2:1 block), chronic anticoagulation with hx atrial flutter who presented to [facility] on 10/24 after Shane [name] fall.  He was found to have severe bradycardia. He was transferred to [facility] for EP eval.  He's now s/p pacemaker placement on 10/28.  Stable for discharge, family to pursue long term care outpatient at [facility].    See below and prior notes for additional details   Hospital Course:  Assessment and Plan:  Symptomatic bradycardia  Second Degree AVB, Mobitz 1 History of Atrial Flutter S/p PPM placement on 10/28  Post op care per cardiology Resume eliquis for anticoagulation on 11/3    HFpEF   Hx CABG Asymptomatic, appears euvolemic Note CXR 10/29 with bilateral effusions and hazy opacities at bilateral lung bases -> appears euvolemic, he has no symptoms -> recommend repeat CXR outpatient    Mechanical Fall  No acute findings on imaging Continue therapy   CABG 1990 Atorvastatin held due to liver injury    Elevated Liver Enzymes Elevated AST/ALT Encourage cessation/moderation of drinking RUQ unrevealing ? Hemodynamically mediated with bradycardia? Follow outpatient   Thyroid nodule TSH mildly low - repeat outpatient    Left foraminal stenosis C5-C6 Follow outpatient   Depression Continue Lexapro 5   BPH Prior Bladder ca with resection Dr. Retta Diones in the past Flomax 0.4  Quant gold for [place] pending     Procedures: 10/28 Procedure dual pacemaker implantation    Echo IMPRESSIONS     1. Left ventricular ejection fraction, by estimation, is 55 to 60%. The  left ventricle has normal function. The left ventricle has no regional  wall motion abnormalities. There is moderate asymmetric left ventricular  hypertrophy of the basal segment.  Left ventricular diastolic parameters are consistent with Grade I  diastolic dysfunction (impaired relaxation).   2. Right ventricular systolic function is normal. The right ventricular  size is normal. Tricuspid regurgitation signal is inadequate for assessing  PA pressure.   3. Left atrial size was moderately dilated.   4. The mitral valve is grossly normal. Trivial mitral valve  regurgitation.   5. The aortic valve is tricuspid with restricted noncoronary cusp. There  is mild calcification of the aortic valve. Aortic valve regurgitation is  not visualized. Aortic valve sclerosis/calcification is present, without  any evidence of aortic stenosis.  measures 1.4 x 1.5 x 1.1 cm. Margins are ill-defined. No calcifications or suspicious features. Findings are consistent with TI-RADS category 3. *Given size (>/= 1.5 - 2.4 cm) and appearance, Shane [name] follow-up ultrasound in 1 year should be considered based on TI-RADS criteria. IMPRESSION: 1. Mildly enlarged and heterogeneous thyroid gland. 2. Solitary 1.5 cm TI-RADS category 3 nodule in the left mid to lower gland meets criteria for imaging surveillance. Recommend follow-up ultrasound in 1 year. The above is in keeping with the ACR TI-RADS recommendations - J Am Coll Radiol 2017;14:587-595. Electronically Signed   By: Malachy Moan M.D.   On: 07/03/2023 07:27   ECHOCARDIOGRAM COMPLETE  Result Date: 07/01/2023    ECHOCARDIOGRAM REPORT   Patient Name:   Shane [name] Date of Exam: 07/01/2023 Medical Rec #:  [id number]      Height:       67.0 in Accession #:    [id number]     Weight:       150.0 lb Date of Birth:  16-May-1933     BSA:          1.790 m Patient Age:    89 years       BP:           141/66  mmHg Patient Gender: M              HR:           60 bpm. [facility] Procedure: 2D Echo, Cardiac Doppler and Color Doppler Indications:    CAD Native Vessel I25.10  History:        Patient has no prior history of Echocardiogram examinations.                 CAD, Prior CABG; Arrythmias:Atrial Flutter.  Sonographer:    Darlys Gales Referring Phys: [phone] CARLOS MADERA IMPRESSIONS  1. Left ventricular ejection fraction, by estimation, is 55 to 60%. The left ventricle has normal function. The left ventricle has no regional wall motion abnormalities. There is moderate asymmetric left ventricular hypertrophy of the basal segment. Left ventricular diastolic parameters are consistent with Grade I diastolic dysfunction (impaired relaxation).  2. Right ventricular systolic function is normal. The right ventricular size is normal. Tricuspid regurgitation signal is inadequate for assessing PA pressure.  3. Left atrial size was moderately dilated.  4. The mitral valve is grossly normal. Trivial mitral valve regurgitation.  5. The aortic valve is tricuspid with restricted noncoronary cusp. There is mild calcification of the aortic valve. Aortic valve regurgitation is not visualized. Aortic valve sclerosis/calcification is present, without any evidence of aortic stenosis. Aortic valve mean gradient measures 4.0 mmHg.  6. The inferior vena cava is normal in size with greater than 50% respiratory variability, suggesting right atrial pressure of 3 mmHg. Comparison(s): No prior Echocardiogram. FINDINGS  Left Ventricle: Left ventricular ejection fraction, by estimation, is 55 to 60%. The left ventricle has normal function. The left ventricle has no regional wall motion abnormalities. The left ventricular internal cavity size was normal in size. There is  moderate asymmetric left ventricular hypertrophy of the basal segment. Left ventricular diastolic parameters are consistent with Grade I diastolic dysfunction (impaired  relaxation). Right Ventricle: The right ventricular size is normal. No increase in right ventricular wall thickness. Right ventricular systolic function is normal. Tricuspid regurgitation signal is inadequate for assessing PA pressure. Left Atrium: Left atrial size was moderately dilated. Right Atrium: Right atrial size was normal in size. Pericardium: There is  Physician Discharge Summary  Shane [name] MVH:[id number] DOB: 1933-06-21 DOA: 06/30/2023  PCP: [name], MD  Admit date: 06/30/2023 Discharge date: 07/06/2023  Time spent: 40 minutes  Recommendations for Outpatient Follow-up:  Follow outpatient CBC/CMP  Follow with cardiology outpatient - start eliquis 11/3 Follow repeat CXR outpatient - bilateral effusions noted 10/29 here, asymptomatic - follow volume/symptoms outpatient Elevated liver enzymes -> encourage etoh moderation/cessation, additional workup if persistently elevated (lipitor currently on hold) Needs repeat TSH and free T4 outpatient (needs thyroid US in 1 year to follow thyroid nodule) Severe L sided neural foraminal stenosis at C5-6, consider nsgy follow up if symptomatic  Discharge Diagnoses:  Principal Problem:   Bradycardia Active Problems:   Polymyalgia rheumatica (HCC)   BPH (benign prostatic hyperplasia)   Arteriosclerotic cardiovascular disease (ASCVD)   Hyperlipidemia   Chronic anticoagulation   Atrial flutter, paroxysmal (HCC)   Borderline hypertension   Mobitz type 2 second degree heart block   Discharge Condition: stable  Diet recommendation: heart healthy  Filed Weights   06/30/23 1259 07/01/23 1457  Weight: 68 kg 70.5 kg    History of present illness:   89 yo with hx CABG in [address], bradycardia (hx 2:1 block), chronic anticoagulation with hx atrial flutter who presented to [facility] on 10/24 after Shane [name] fall.  He was found to have severe bradycardia. He was transferred to [facility] for EP eval.  He's now s/p pacemaker placement on 10/28.  Stable for discharge, family to pursue long term care outpatient at [facility].    See below and prior notes for additional details   Hospital Course:  Assessment and Plan:  Symptomatic bradycardia  Second Degree AVB, Mobitz 1 History of Atrial Flutter S/p PPM placement on 10/28  Post op care per cardiology Resume eliquis for anticoagulation on 11/3    HFpEF   Hx CABG Asymptomatic, appears euvolemic Note CXR 10/29 with bilateral effusions and hazy opacities at bilateral lung bases -> appears euvolemic, he has no symptoms -> recommend repeat CXR outpatient    Mechanical Fall  No acute findings on imaging Continue therapy   CABG 1990 Atorvastatin held due to liver injury    Elevated Liver Enzymes Elevated AST/ALT Encourage cessation/moderation of drinking RUQ unrevealing ? Hemodynamically mediated with bradycardia? Follow outpatient   Thyroid nodule TSH mildly low - repeat outpatient    Left foraminal stenosis C5-C6 Follow outpatient   Depression Continue Lexapro 5   BPH Prior Bladder ca with resection Dr. Retta Diones in the past Flomax 0.4  Quant gold for [place] pending     Procedures: 10/28 Procedure dual pacemaker implantation    Echo IMPRESSIONS     1. Left ventricular ejection fraction, by estimation, is 55 to 60%. The  left ventricle has normal function. The left ventricle has no regional  wall motion abnormalities. There is moderate asymmetric left ventricular  hypertrophy of the basal segment.  Left ventricular diastolic parameters are consistent with Grade I  diastolic dysfunction (impaired relaxation).   2. Right ventricular systolic function is normal. The right ventricular  size is normal. Tricuspid regurgitation signal is inadequate for assessing  PA pressure.   3. Left atrial size was moderately dilated.   4. The mitral valve is grossly normal. Trivial mitral valve  regurgitation.   5. The aortic valve is tricuspid with restricted noncoronary cusp. There  is mild calcification of the aortic valve. Aortic valve regurgitation is  not visualized. Aortic valve sclerosis/calcification is present, without  any evidence of aortic stenosis.  Physician Discharge Summary  Shane [name] MVH:[id number] DOB: 1933-06-21 DOA: 06/30/2023  PCP: [name], MD  Admit date: 06/30/2023 Discharge date: 07/06/2023  Time spent: 40 minutes  Recommendations for Outpatient Follow-up:  Follow outpatient CBC/CMP  Follow with cardiology outpatient - start eliquis 11/3 Follow repeat CXR outpatient - bilateral effusions noted 10/29 here, asymptomatic - follow volume/symptoms outpatient Elevated liver enzymes -> encourage etoh moderation/cessation, additional workup if persistently elevated (lipitor currently on hold) Needs repeat TSH and free T4 outpatient (needs thyroid US in 1 year to follow thyroid nodule) Severe L sided neural foraminal stenosis at C5-6, consider nsgy follow up if symptomatic  Discharge Diagnoses:  Principal Problem:   Bradycardia Active Problems:   Polymyalgia rheumatica (HCC)   BPH (benign prostatic hyperplasia)   Arteriosclerotic cardiovascular disease (ASCVD)   Hyperlipidemia   Chronic anticoagulation   Atrial flutter, paroxysmal (HCC)   Borderline hypertension   Mobitz type 2 second degree heart block   Discharge Condition: stable  Diet recommendation: heart healthy  Filed Weights   06/30/23 1259 07/01/23 1457  Weight: 68 kg 70.5 kg    History of present illness:   89 yo with hx CABG in [id number], bradycardia (hx 2:1 block), chronic anticoagulation with hx atrial flutter who presented to [facility] on 10/24 after Shane [name] fall.  He was found to have severe bradycardia. He was transferred to [facility] for EP eval.  He's now s/p pacemaker placement on 10/28.  Stable for discharge, family to pursue long term care outpatient at [facility].    See below and prior notes for additional details   Hospital Course:  Assessment and Plan:  Symptomatic bradycardia  Second Degree AVB, Mobitz 1 History of Atrial Flutter S/p PPM placement on 10/28  Post op care per cardiology Resume eliquis for anticoagulation on 11/3    HFpEF   Hx CABG Asymptomatic, appears euvolemic Note CXR 10/29 with bilateral effusions and hazy opacities at bilateral lung bases -> appears euvolemic, he has no symptoms -> recommend repeat CXR outpatient    Mechanical Fall  No acute findings on imaging Continue therapy   CABG 1990 Atorvastatin held due to liver injury    Elevated Liver Enzymes Elevated AST/ALT Encourage cessation/moderation of drinking RUQ unrevealing ? Hemodynamically mediated with bradycardia? Follow outpatient   Thyroid nodule TSH mildly low - repeat outpatient    Left foraminal stenosis C5-C6 Follow outpatient   Depression Continue Lexapro 5   BPH Prior Bladder ca with resection Dr. Retta Diones in the past Flomax 0.4  Quant gold for [place] pending     Procedures: 10/28 Procedure dual pacemaker implantation    Echo IMPRESSIONS     1. Left ventricular ejection fraction, by estimation, is 55 to 60%. The  left ventricle has normal function. The left ventricle has no regional  wall motion abnormalities. There is moderate asymmetric left ventricular  hypertrophy of the basal segment.  Left ventricular diastolic parameters are consistent with Grade I  diastolic dysfunction (impaired relaxation).   2. Right ventricular systolic function is normal. The right ventricular  size is normal. Tricuspid regurgitation signal is inadequate for assessing  PA pressure.   3. Left atrial size was moderately dilated.   4. The mitral valve is grossly normal. Trivial mitral valve  regurgitation.   5. The aortic valve is tricuspid with restricted noncoronary cusp. There  is mild calcification of the aortic valve. Aortic valve regurgitation is  not visualized. Aortic valve sclerosis/calcification is present, without  any evidence of aortic stenosis.

## 2023-07-06 NOTE — TOC Initial Note (Signed)
Transition of Care [facility]) - Initial/Assessment Note    Patient Details  Name: Shane [name] MRN: [id number] Date of Birth: December 09, 1932  [facility] CM/SW Contact:    Lawerance Sabal, RN Phone Number: 07/06/2023, 10:17 AM  Clinical Narrative:                  Patient from home, admitted for bradycardia, spoke with son in hallway, declined DME states that he has needed DME at home, son will transport home.    Expected Discharge Plan: Home/Self Care Barriers to Discharge: Continued Medical Work up   Patient Goals and CMS Choice            Expected Discharge Plan and Services In-house Referral: Clinical Social Work Discharge Planning Services: CM Consult   Living arrangements for the past 2 months: Single Family Home                 DME Arranged: N/A           HH Agency: NA        Prior Living Arrangements/Services Living arrangements for the past 2 months: Single Family Home                     Activities of Daily Living   ADL Screening (condition at time of admission) Independently performs ADLs?: No Does the patient have a NEW difficulty with bathing/dressing/toileting/self-feeding that is expected to last >3 days?: Yes (Initiates electronic notice to provider for possible OT consult) Does the patient have a NEW difficulty with getting in/out of bed, walking, or climbing stairs that is expected to last >3 days?: Yes (Initiates electronic notice to provider for possible PT consult) Does the patient have a NEW difficulty with communication that is expected to last >3 days?: Yes (Initiates electronic notice to provider for possible SLP consult) Is the patient deaf or have difficulty hearing?: Yes Does the patient have difficulty seeing, even when wearing glasses/contacts?: No Does the patient have difficulty concentrating, remembering, or making decisions?: Yes  Permission Sought/Granted                  Emotional Assessment               Admission diagnosis:  Bradycardia [R00.1] Symptomatic bradycardia [R00.1] Syncope, unspecified syncope type [R55] Patient Active Problem List   Diagnosis Date Noted   Bradycardia 06/30/2023   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   Mobitz type 2 second degree heart block 12/17/2016   1st degree AV block 12/17/2016   Symptomatic bradycardia 12/16/2016   Encounter for therapeutic drug monitoring 10/03/2013   Colon cancer screening 07/12/2013   Borderline hypertension 10/07/2011   Arteriosclerotic cardiovascular disease (ASCVD)    Hyperlipidemia    Chronic anticoagulation    Atrial flutter, paroxysmal (HCC)    Nephrolithiasis 03/26/2009   Polymyalgia rheumatica (HCC) 03/26/2009   BPH (benign prostatic hyperplasia) 03/26/2009   PCP:  [name], MD Pharmacy:   [facility] [address], [place] - [address] [place] [address] Phone: [phone] Fax: [phone]     Social Determinants of Health (SDOH) Social History: SDOH Screenings   Food Insecurity: No Food Insecurity (07/01/2023)  Housing: Low Risk  (07/01/2023)  Transportation Needs: No Transportation Needs (07/01/2023)  Utilities: Not At Risk (07/01/2023)  Tobacco Use: Medium Risk (06/30/2023)   SDOH Interventions:     Readmission Risk Interventions  No data to display

## 2023-07-06 NOTE — Progress Notes (Signed)
Occupational Therapy Treatment Patient Details Name: Shane [name] MRN: [id number] DOB: [birth year] Today's Date: 07/06/2023   History of present illness Pt is an 89yo male presenting to [facility] after a mechanical fall onto his elbows and found down. CT neck showed severe left-sided neuroforaminal stenosis C5-C6 +5 cm L thyroid nodule, all other imaging negative for acute findings. PPM placed 10/28.    PMH: BPH, bradycardia, HLD, AV Block type 1, polymyalgia rheumatica, lumbar disc surgery, CABG.   OT comments  Session focus on continuation of PPM precuations with granddaughter present. Patient able to follow with corrections, but requires cues each time to not push through LUE when attempting ADL management. Patient min A for functional mobility without AD due to minimal posterior LOB when coming into standing, and CGA for ADL management. OT recommendation remains appropriate, OT will follow.       If plan is discharge home, recommend the following:  A little help with bathing/dressing/bathroom;Assist for transportation   Equipment Recommendations  BSC/3in1 (for use over toilet)    Recommendations for Other Services      Precautions / Restrictions Precautions Precautions: Fall;ICD/Pacemaker Precaution Comments: recent fall Restrictions Weight Bearing Restrictions: No       Mobility Bed Mobility Overal bed mobility: Needs Assistance Bed Mobility: Supine to Sit     Supine to sit: Min assist     General bed mobility comments: min A to come into sitting so as to not use LUE    Transfers Overall transfer level: Needs assistance Equipment used: None Transfers: Sit to/from Stand Sit to Stand: Min assist           General transfer comment: to stand from EOB, cues to avoid pushing through LUE, one minor posterior LOB, but patient able to correct     Balance Overall balance assessment: History of Falls, No apparent balance deficits (not formally assessed)                                          ADL either performed or assessed with clinical judgement   ADL Overall ADL's : Needs assistance/impaired     Grooming: Set up;Sitting;Wash/dry hands               Lower Body Dressing: Sitting/lateral leans;Sit to/from stand;Contact guard assist Lower Body Dressing Details (indicate cue type and reason): boxers in standing when at toilet Toilet Transfer: Ambulation;Minimal assistance Toilet Transfer Details (indicate cue type and reason): up to min A due to posterior LOB in standing, but patient able to correct in standing Toileting- Clothing Manipulation and Hygiene: Contact guard assist;Sit to/from stand       Functional mobility during ADLs: Contact guard assist General ADL Comments: Session focus on continuation of PPM precuations with granddaughter present. Patient able to follow with corrections, but requires cues each time to not push through LUE when attempting ADL management.    Extremity/Trunk Assessment Upper Extremity Assessment LUE Deficits / Details: s/p PPM placement. able to bend elbow WFL, minor edema noted. LUE Coordination: decreased gross motor            Vision   Vision Assessment?: No apparent visual deficits   Perception     Praxis      Cognition Arousal: Alert Behavior During Therapy: WFL for tasks assessed/performed Overall Cognitive Status: Impaired/Different from baseline Area of Impairment: Memory  Memory: Decreased recall of precautions         General Comments: does need cues for PPM precautions, granddaughter present for education        Exercises      Shoulder Instructions       General Comments      Pertinent Vitals/ Pain       Pain Assessment Pain Assessment: No/denies pain Pain Intervention(s): Limited activity within patient's tolerance, Monitored during session, Repositioned  Home Living                                           Prior Functioning/Environment              Frequency  Min 1X/week        Progress Toward Goals  OT Goals(current goals can now be found in the care plan section)  Progress towards OT goals: Progressing toward goals  Acute Rehab OT Goals Patient Stated Goal: to get better OT Goal Formulation: With patient/family Time For Goal Achievement: 07/17/23 Potential to Achieve Goals: Good  Plan      Co-evaluation                 AM-PAC OT "6 Clicks" Daily Activity     Outcome Measure   Help from another person eating meals?: None Help from another person taking care of personal grooming?: None Help from another person toileting, which includes using toliet, bedpan, or urinal?: A Little Help from another person bathing (including washing, rinsing, drying)?: A Little Help from another person to put on and taking off regular upper body clothing?: None Help from another person to put on and taking off regular lower body clothing?: A Little 6 Click Score: 21    End of Session Equipment Utilized During Treatment: Gait belt  OT Visit Diagnosis: Unsteadiness on feet (R26.81);History of falling (Z91.81)   Activity Tolerance Patient tolerated treatment well   Patient Left with call bell/phone within reach;in bed;with family/visitor present   Nurse Communication Mobility status        Time: 1140-1153 OT Time Calculation (min): 13 min  Charges: OT General Charges $OT Visit: 1 Visit OT Treatments $Self Care/Home Management : 8-22 mins  Pollyann Glen E. Jediah Horger, OTR/L Acute Rehabilitation Services [phone]   Cherlyn Cushing 07/06/2023, 12:15 PM

## 2023-07-06 NOTE — NC FL2 (Signed)
[place] MEDICAID FL2 LEVEL OF CARE FORM     IDENTIFICATION  Patient Name: Shane [name] Birthdate: 09-Feb-1933 Sex: male Admission Date (Current Location): 06/30/2023  [facility] [place] Number:  Producer, television/film/video and Address:  [facility] [place], 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   Mobitz type 2 second degree heart block 12/17/2016   1st degree AV block 12/17/2016   Symptomatic bradycardia 12/16/2016   Encounter for therapeutic drug monitoring 10/03/2013   Colon cancer screening 07/12/2013   Borderline hypertension 10/07/2011   Arteriosclerotic cardiovascular disease (ASCVD)    Hyperlipidemia    Chronic anticoagulation    Atrial flutter, paroxysmal (HCC)    Nephrolithiasis 03/26/2009   Polymyalgia rheumatica (HCC) 03/26/2009   BPH (benign prostatic hyperplasia) 03/26/2009    Orientation RESPIRATION BLADDER Height & Weight     Self, Place  Normal Continent Weight: 155 lb 6.8 oz (70.5 kg) Height:  5\' 7"  (170.2 cm)  BEHAVIORAL SYMPTOMS/MOOD NEUROLOGICAL BOWEL NUTRITION STATUS      Continent Diet (Please see discharge summary)  AMBULATORY STATUS COMMUNICATION OF NEEDS Skin   Supervision Verbally Other (Comment) (Abrasion,arm,elbow,Leg,knee,Bil.,Eccymossis,arm,back,knee,leg,Bil.,Wound/Incision LDAs,Incision closed,chest,L,upper)                        Personal Care Assistance Level of Assistance  Bathing, Feeding, Dressing Bathing Assistance: Limited assistance Feeding assistance: Independent Dressing Assistance: Limited assistance     Functional Limitations Info  Sight, Hearing, Speech   Hearing Info: Impaired Speech Info: Adequate    SPECIAL CARE FACTORS FREQUENCY  PT (By licensed PT), OT (By licensed OT)     PT Frequency: 3x min weekly OT Frequency: 3x min weekly            Contractures Contractures Info: Not present    Additional Factors Info  Code Status, Allergies, Psychotropic Code Status Info: FULL Allergies Info: NKA Psychotropic Info: escitalopram (LEXAPRO) tablet 5 mg every evening         Current Medications (07/06/2023):  This is the current hospital active medication list Current Facility-Administered Medications  Medication Dose Route Frequency Provider Last Rate Last Admin   acetaminophen (TYLENOL) tablet 500 mg  500 mg Oral Q6H PRN Rhetta Mura, MD       albuterol (PROVENTIL) (2.5 MG/3ML) 0.083% nebulizer solution 2.5 mg  2.5 mg Nebulization Q2H PRN Elgergawy, Leana Roe, MD       escitalopram (LEXAPRO) tablet 5 mg  5 mg Oral QPM Elgergawy, Leana Roe, MD   5 mg at 07/05/23 1704   hydrALAZINE (APRESOLINE) injection 2 mg  2 mg Intravenous Q6H PRN Elgergawy, Leana Roe, MD       melatonin tablet 3 mg  3 mg Oral QHS PRN Hillary Bow, DO   3 mg at 07/05/23 2216   ondansetron (ZOFRAN) injection 4 mg  4 mg Intravenous Q6H PRN Duke Salvia, MD  polyethylene glycol (MIRALAX / GLYCOLAX) packet 17 g  17 g Oral Daily PRN Elgergawy, Leana Roe, MD       tamsulosin (FLOMAX) capsule 0.4 mg  0.4 mg Oral QPC supper Elgergawy, Leana Roe, MD   0.4 mg at 07/05/23 1705   traMADol (ULTRAM) tablet 50 mg  50 mg Oral Q6H PRN Rhetta Mura, MD         Discharge Medications: Please see discharge summary for a list of discharge medications.  Relevant Imaging Results:  Relevant Lab  Results:   Additional Information [id number]  Delilah Shan, LCSWA

## 2023-07-10 LAB — QUANTIFERON-TB GOLD PLUS (RQFGPL)
QuantiFERON Mitogen Value: >10 [IU]/mL
QuantiFERON Nil Value: 0 [IU]/mL
QuantiFERON TB1 Ag Value: 1.49 [IU]/mL
QuantiFERON TB2 Ag Value: 2.35 [IU]/mL

## 2023-07-10 LAB — QUANTIFERON-TB GOLD PLUS: QuantiFERON-TB Gold Plus: POSITIVE — AB

## 2023-07-20 ENCOUNTER — Ambulatory Visit: Payer: Medicare HMO | Attending: Cardiology

## 2023-07-20 DIAGNOSIS — I452 Bifascicular block: Secondary | ICD-10-CM

## 2023-07-20 DIAGNOSIS — R001 Bradycardia, unspecified: Secondary | ICD-10-CM | POA: Diagnosis not present

## 2023-07-20 LAB — CUP PACEART INCLINIC DEVICE CHECK
Battery Remaining Longevity: 140 mo
Battery Voltage: 3.21 V
Brady Statistic AP VP Percent: 1.82 %
Brady Statistic AP VS Percent: 0.01 %
Brady Statistic AS VP Percent: 97.95 %
Brady Statistic AS VS Percent: 0.22 %
Brady Statistic RA Percent Paced: 1.76 %
Brady Statistic RV Percent Paced: 94.29 %
Date Time Interrogation Session: 20241113150542
Implantable Lead Connection Status: 753985
Implantable Lead Connection Status: 753985
Implantable Lead Implant Date: 20241028
Implantable Lead Implant Date: 20241028
Implantable Lead Location: 753859
Implantable Lead Location: 753860
Implantable Lead Model: 3830
Implantable Lead Model: 5076
Implantable Pulse Generator Implant Date: 20241028
Lead Channel Impedance Value: 285 Ohm
Lead Channel Impedance Value: 380 Ohm
Lead Channel Impedance Value: 494 Ohm
Lead Channel Impedance Value: 532 Ohm
Lead Channel Pacing Threshold Amplitude: 0.625 V
Lead Channel Pacing Threshold Amplitude: 1 V
Lead Channel Pacing Threshold Pulse Width: 0.4 ms
Lead Channel Pacing Threshold Pulse Width: 0.4 ms
Lead Channel Sensing Intrinsic Amplitude: 2.25 mV
Lead Channel Sensing Intrinsic Amplitude: 2.75 mV
Lead Channel Sensing Intrinsic Amplitude: 6.25 mV
Lead Channel Sensing Intrinsic Amplitude: 8.625 mV
Lead Channel Setting Pacing Amplitude: 3.5 V
Lead Channel Setting Pacing Amplitude: 3.5 V
Lead Channel Setting Pacing Pulse Width: 0.4 ms
Lead Channel Setting Sensing Sensitivity: 1.2 mV
Zone Setting Status: 755011

## 2023-07-20 NOTE — Progress Notes (Signed)
Wound check appointment. Dermabond removed. Wound without redness or edema. Incision edges approximated, wound well healed. Normal device function. Thresholds, sensing, and impedances consistent with implant measurements. Device programmed at 3.5V/auto capture programmed on for extra safety margin until 3 month visit. Histogram distribution appropriate for patient and level of activity. Afib burden 6.5% on Eliquis.  No high ventricular rates noted. Patient educated about wound care, arm mobility, lifting restrictions. ROV in 3 months with implanting physician.

## 2023-07-20 NOTE — Patient Instructions (Signed)
   After Your Pacemaker   Monitor your pacemaker site for redness, swelling, and drainage. Call the device clinic at [phone] if you experience these symptoms or fever/chills.  Your incision was closed with Dermabond:  You may shower 1 day after your defibrillator implant and wash your incision with soap and water. Avoid lotions, ointments, or perfumes over your incision until it is well-healed.  You may use a hot tub or a pool after your wound check appointment if the incision is completely closed.  Do not lift, push or pull greater than 10 pounds with the affected arm until 6 weeks after your procedure. There are no other restrictions in arm movement after your wound check appointment.  You may drive, unless driving has been restricted by your healthcare providers.  Remote monitoring is used to monitor your pacemaker from home. This monitoring is scheduled every 91 days by our office. It allows us to keep an eye on the functioning of your device to ensure it is working properly. You will routinely see your Electrophysiologist annually (more often if necessary).  

## 2023-08-08 ENCOUNTER — Encounter: Payer: Self-pay | Admitting: Cardiology

## 2023-08-08 ENCOUNTER — Ambulatory Visit: Payer: Medicare HMO | Attending: Cardiology | Admitting: Cardiology

## 2023-08-08 NOTE — Progress Notes (Unsigned)
Clinical Summary Mr. Orio is a 89 y.o.male  seen today for follow up of the following medical problems.    1. CAD - prior CABG in 1990 - last cath 2003 as reported below. - Jan 2010 echo LVEF 55-60%         - walks 2-3 miles a day without symptoms - no chest pain, no SOB/DOE - compliant with meds   2. Hyperlipidemia - 12/2022 TC 143 TG 62 HDL 63 LDL 67     3. Aflutter - has not required av nodal agents  - no palpitations - no bleeding on coumadin. Has not wanted to change to DOAC   5. Chronic bradycardia/AV block - 2:1 AV block noted on holter. FOllowed by EP for mobitz type I undergoing watchful waiting.    - 06/2023 pacemaker implant     SH: married 63 years. Son is an ob/gyn in [employer], another son is a Charity fundraiser who lived in [place] 5 years moving to [place].  Past Medical History:  Diagnosis Date   Arteriosclerotic cardiovascular disease (ASCVD)    CABG surgery in 1990. MI in 1986; coronary angiography in 2003-critical LAD with patent LIMA; total obstruction of the RCA with patent RIMA; low normal EF.   Atrial flutter, paroxysmal (HCC)    asymptomatic; onset in 2010; 4:1 AVB with low-dose metoprolol; moderate left      atrial enlargement and mild left ventricular hypertrophy with normal ejection fraction by echocardiography   Benign prostatic hypertrophy    Bifascicular block    EKG 07/27/22: SB with SA, first-degree AV block, RBBB, left posterior fascicular block (bifascicular block) .   Bradycardia    a. requiring cessation of beta blocker 12/2016.   Chronic anticoagulation    Current use of long term anticoagulation    Remains on chronic Coumadin   Hyperlipidemia    Lipid profile in 09/2010:196, 84, 54, 125   Mobitz type 1 second degree atrioventricular block 12/17/2016   Nephrolithiasis    Polymyalgia rheumatica (HCC)      No Known Allergies   Current Outpatient Medications  Medication Sig Dispense Refill   acetaminophen (TYLENOL) 500 MG tablet  Take 1 tablet (500 mg total) by mouth every 6 (six) hours as needed for mild pain (pain score 1-3). 30 tablet 0   apixaban (ELIQUIS) 5 MG TABS tablet Take 1 tablet (5 mg total) by mouth 2 (two) times daily. (Start on Sunday 07/10/2023) 60 tablet 1   escitalopram (LEXAPRO) 5 MG tablet Take 5 mg by mouth every evening.     tamsulosin (FLOMAX) 0.4 MG CAPS capsule Take 1 capsule (0.4 mg total) by mouth daily after supper. 30 capsule 3   No current facility-administered medications for this visit.     Past Surgical History:  Procedure Laterality Date   COLONOSCOPY  08/21/01   RMR: Left-sided diverticulum.  Remainder of colonic mucosa appeared normal   COLONOSCOPY N/A 07/31/2013   Procedure: COLONOSCOPY;  Surgeon: West Bali, MD;  Location: [facility];  Service: Endoscopy;  Laterality: N/A;  10:30   CORONARY ARTERY BYPASS GRAFT  1990   LUMBAR DISC SURGERY     PACEMAKER IMPLANT N/A 07/04/2023   Procedure: PACEMAKER IMPLANT;  Surgeon: Duke Salvia, MD;  Location: [facility] INVASIVE CV LAB;  Service: Cardiovascular;  Laterality: N/A;   TRANSURETHRAL RESECTION OF BLADDER TUMOR N/A 03/01/2017   Procedure: TRANSURETHRAL RESECTION OF BLADDER TUMOR (TURBT);  Surgeon: Marcine Matar, MD;  Location: [facility];  Service: Urology;  Laterality: N/A;   TRANSURETHRAL RESECTION OF BLADDER TUMOR N/A 04/12/2017   Procedure: REPEAT TRANSURETHRAL RESECTION OF BLADDER TUMOR (TURBT);  Surgeon: Marcine Matar, MD;  Location: [facility];  Service: Urology;  Laterality: N/A;  1 HR [phone] [employer] [id number]     No Known Allergies    Family History  Problem Relation Age of Onset   Heart attack Mother    Coronary artery disease Brother        CABG + pacemaker   Pneumonia Brother        infant death   Colon cancer Neg Hx    Colon polyps Neg Hx      Social History Mr. Maples reports that he quit smoking about 40 years ago. His smoking use included cigarettes. He started smoking about 65 years ago.  He has a 37.5 pack-year smoking history. He has never been exposed to tobacco smoke. He has never used smokeless tobacco. Mr. Degnan reports current alcohol use.   Review of Systems CONSTITUTIONAL: No weight loss, fever, chills, weakness or fatigue.  HEENT: Eyes: No visual loss, blurred vision, double vision or yellow sclerae.No hearing loss, sneezing, congestion, runny nose or sore throat.  SKIN: No rash or itching.  CARDIOVASCULAR:  RESPIRATORY: No shortness of breath, cough or sputum.  GASTROINTESTINAL: No anorexia, nausea, vomiting or diarrhea. No abdominal pain or blood.  GENITOURINARY: No burning on urination, no polyuria NEUROLOGICAL: No headache, dizziness, syncope, paralysis, ataxia, numbness or tingling in the extremities. No change in bowel or bladder control.  MUSCULOSKELETAL: No muscle, back pain, joint pain or stiffness.  LYMPHATICS: No enlarged nodes. No history of splenectomy.  PSYCHIATRIC: No history of depression or anxiety.  ENDOCRINOLOGIC: No reports of sweating, cold or heat intolerance. No polyuria or polydipsia.  [name]   Physical Examination There were no vitals filed for this visit. There were no vitals filed for this visit.  Gen: resting comfortably, no acute distress HEENT: no scleral icterus, pupils equal round and reactive, no palptable cervical adenopathy,  CV Resp: Clear to auscultation bilaterally GI: abdomen is soft, non-tender, non-distended, normal bowel sounds, no hepatosplenomegaly MSK: extremities are warm, no edema.  Skin: warm, no rash Neuro:  no focal deficits Psych: appropriate affect   Diagnostic Studies  Cath 07/2002 FINDINGS:   1. Left main trunk. Medium caliber vessel with mild ________.   2. LAD. This is a medium caliber vessel which supplies a trivial first   diagonal Jerzey Komperda proximal segment, medium caliber second diagonal Tynisa Vohs   thereafter. The LAD then extends to the apex. The LAD has moderate   disease, 50% of the proximal left  segment, which then extends into a   narrowing of 70% encompassing the trivial diagonal Princess Karnes. The distal   LAD has mild irregularities and is seen to fill predominantly via the   LIMA graft. There is mid narrowing of 30%. The second diagonal Lourene Hoston   has an ostial narrowing of 50% and fills predominantly via antegrade   flow.   3. Left circumflex artery. This is a medium caliber vessel that supplies a   small first marginal Trek Kimball and the proximal segment a larger second   marginal Shaquina Gillham. In the mid-section, there is moderate narrowing of 30-   40% of the proximal segment of the second marginal Yordan Martindale.   4. Right coronary artery. This was a dominant medium caliber vessel that   supplies the posterior descending artery and a posterior ventricular   Hanz Winterhalter in its terminal segment. The right coronary artery  is 100%   occluded in the mid-section. The distal vessel fills the in situ RIMA   graft anastomosed to the distal right coronary artery. The posterior   descending artery and the posterior ventricular Kennadee Walthour have mild _______   of 30%.   5. RIMA to the distal right coronary artery is patent. This was an in situ   graft.   6. LIMA to the LAD is patent. This was also an in situ graft.   7. Left ventricle. Normal end-systolic and end-diastolic dimensions.   Normal left ventricular function is well preserved. Ejection fraction 50-   55%. No mitral regurgitation. LV pressure is 120/5. Aortic is 120/65.   LVEDP is 15.   ASSESSMENT AND PLAN: The patient is a 89 year old gentleman with two-vessel   coronary artery disease that is well revascularized surgically. He has well-   preserved LV function. The only concern is the second diagonal Vimal Derego which   is compromised by moderate disease in the proximal and mid-LAD. This does   not appear to be critical in nature; however, this disease is presumptively   the reason the patient underwent bypass surgery. In addition, the amount of   myocardium  supplied by this diagonal Shamaine Mulkern is relatively small and is   unlikely to elicit the symptoms the patient presented with. We will thus   pursue a conservative course of medical therapy should the patient have   recurrent symptoms or an abnormal stress imaging study. Percutaneous   intervention may be considered to improve flow to the second diagonal   Laylia Mui.     Jan 2010 Echo SUMMARY - Overall left ventricular systolic function was normal. Left ventricular ejection fraction was estimated , range being 55 % to 60 %. There were no left ventricular regional wall motion abnormalities. Left ventricular wall thickness was mildly increased. There was moderate basal septal hypertrophy. - The aortic valve was mildly calcified. - There was mild fibrocalcific change of the aortic root. - The effective orifice of mitral regurgitation by proximal isovelocity surface area was 0.13 cm^2. The volume of mitral regurgitation by proximal isovelocity surface area was 19 cc. - The left atrium was moderately dilated. - There was mild right ventricular hypertrophy. - The right atrium was mild to moderately dilated.   06/2023 echo 1. Left ventricular ejection fraction, by estimation, is 55 to 60%. The  left ventricle has normal function. The left ventricle has no regional  wall motion abnormalities. There is moderate asymmetric left ventricular  hypertrophy of the basal segment.  Left ventricular diastolic parameters are consistent with Grade I  diastolic dysfunction (impaired relaxation).   2. Right ventricular systolic function is normal. The right ventricular  size is normal. Tricuspid regurgitation signal is inadequate for assessing  PA pressure.   3. Left atrial size was moderately dilated.   4. The mitral valve is grossly normal. Trivial mitral valve  regurgitation.   5. The aortic valve is tricuspid with restricted noncoronary cusp. There  is mild calcification of the aortic valve. Aortic  valve regurgitation is  not visualized. Aortic valve sclerosis/calcification is present, without  any evidence of aortic stenosis.  Aortic valve mean gradient measures 4.0 mmHg.   6. The inferior vena cava is normal in size with greater than 50%  respiratory variability, suggesting right atrial pressure of 3 mmHg.    Assessment and Plan   1. CAD - no recent symptmos - continue current meds   2. Hyperlipidemia - continue statin, request pcp labs  3. Aflutter - has not been on av nodal agent due to bradycardia - not interested in NOACs, remains on coumaidn - no symptmos, continue current meds   4. Bradycardia/AV block 2nd degree -Has been evaluated by EP - watchful waiting - no symptoms, conitnue to monitor at this time     Antoine Poche, M.D., F.A.C.C.

## 2023-09-13 ENCOUNTER — Ambulatory Visit: Payer: Self-pay | Admitting: *Deleted

## 2023-09-13 ENCOUNTER — Other Ambulatory Visit (HOSPITAL_COMMUNITY): Payer: Self-pay | Admitting: Internal Medicine

## 2023-09-13 DIAGNOSIS — R4189 Other symptoms and signs involving cognitive functions and awareness: Secondary | ICD-10-CM

## 2023-09-16 ENCOUNTER — Ambulatory Visit (HOSPITAL_COMMUNITY)
Admission: RE | Admit: 2023-09-16 | Discharge: 2023-09-16 | Disposition: A | Discharge status: Home / Self Care | Payer: Medicare HMO | Source: Ambulatory Visit | Attending: Internal Medicine | Admitting: Internal Medicine

## 2023-09-16 DIAGNOSIS — R4189 Other symptoms and signs involving cognitive functions and awareness: Secondary | ICD-10-CM | POA: Insufficient documentation

## 2023-09-16 DIAGNOSIS — I6789 Other cerebrovascular disease: Secondary | ICD-10-CM | POA: Diagnosis not present

## 2023-10-05 ENCOUNTER — Ambulatory Visit (INDEPENDENT_AMBULATORY_CARE_PROVIDER_SITE_OTHER): Payer: Medicare HMO

## 2023-10-05 DIAGNOSIS — I440 Atrioventricular block, first degree: Secondary | ICD-10-CM | POA: Diagnosis not present

## 2023-10-05 LAB — CUP PACEART REMOTE DEVICE CHECK
Battery Remaining Longevity: 149 mo
Battery Voltage: 3.19 V
Brady Statistic AP VP Percent: 4.46 %
Brady Statistic AP VS Percent: 0.01 %
Brady Statistic AS VP Percent: 95.01 %
Brady Statistic AS VS Percent: 0.47 %
Brady Statistic RA Percent Paced: 3.9 %
Brady Statistic RV Percent Paced: 92.72 %
Date Time Interrogation Session: 20250129013222
Implantable Lead Connection Status: 753985
Implantable Lead Connection Status: 753985
Implantable Lead Implant Date: 20241028
Implantable Lead Implant Date: 20241028
Implantable Lead Location: 753859
Implantable Lead Location: 753860
Implantable Lead Model: 3830
Implantable Lead Model: 5076
Implantable Pulse Generator Implant Date: 20241028
Lead Channel Impedance Value: 266 Ohm
Lead Channel Impedance Value: 342 Ohm
Lead Channel Impedance Value: 475 Ohm
Lead Channel Impedance Value: 513 Ohm
Lead Channel Pacing Threshold Amplitude: 0.625 V
Lead Channel Pacing Threshold Amplitude: 1.125 V
Lead Channel Pacing Threshold Pulse Width: 0.4 ms
Lead Channel Pacing Threshold Pulse Width: 0.4 ms
Lead Channel Sensing Intrinsic Amplitude: 0.875 mV
Lead Channel Sensing Intrinsic Amplitude: 0.875 mV
Lead Channel Sensing Intrinsic Amplitude: 8 mV
Lead Channel Sensing Intrinsic Amplitude: 8 mV
Lead Channel Setting Pacing Amplitude: 1.75 V
Lead Channel Setting Pacing Amplitude: 2 V
Lead Channel Setting Pacing Pulse Width: 0.4 ms
Lead Channel Setting Sensing Sensitivity: 1.2 mV
Zone Setting Status: 755011

## 2023-10-11 ENCOUNTER — Encounter: Payer: Self-pay | Admitting: Internal Medicine

## 2023-10-11 ENCOUNTER — Ambulatory Visit: Payer: Medicare HMO | Attending: Internal Medicine | Admitting: Internal Medicine

## 2023-10-11 VITALS — BP 120/64 | HR 65 | Ht <= 58 in | Wt 171.8 lb

## 2023-10-11 DIAGNOSIS — Z79899 Other long term (current) drug therapy: Secondary | ICD-10-CM | POA: Diagnosis not present

## 2023-10-11 DIAGNOSIS — I4892 Unspecified atrial flutter: Secondary | ICD-10-CM

## 2023-10-11 DIAGNOSIS — Z950 Presence of cardiac pacemaker: Secondary | ICD-10-CM | POA: Diagnosis not present

## 2023-10-11 DIAGNOSIS — R001 Bradycardia, unspecified: Secondary | ICD-10-CM

## 2023-10-11 MED ORDER — FUROSEMIDE 20 MG PO TABS: 20.0000 mg | ORAL_TABLET | Freq: Every day | ORAL | 3 refills | Status: AC

## 2023-10-11 NOTE — Patient Instructions (Addendum)
 Medication Instructions:  Your physician has recommended you make the following change in your medication:   **  Begin Furosemide  20mg  - 1 tablet by mouth daily   *If you need a refill on your cardiac medications before your next appointment, please call your pharmacy*   Lab Work: BMET in 2 weeks  If you have labs (blood work) drawn today and your tests are completely normal, you will receive your results only by: MyChart Message (if you have MyChart) OR A paper copy in the mail If you have any lab test that is abnormal or we need to change your treatment, we will call you to review the results.   Testing/Procedures: None ordered.    Follow-Up: At [facility], you and your health needs are our priority.  As part of our continuing mission to provide you with exceptional heart care, we have created designated Provider Care Teams.  These Care Teams include your primary Cardiologist (physician) and Advanced Practice Providers (APPs -  Physician Assistants and Nurse Practitioners) who all work together to provide you with the care you need, when you need it.  We recommend signing up for the patient portal called MyChart.  Sign up information is provided on this After Visit Summary.  MyChart is used to connect with patients for Virtual Visits (Telemedicine).  Patients are able to view lab/test results, encounter notes, upcoming appointments, etc.  Non-urgent messages can be sent to your provider as well.   To learn more about what you can do with MyChart, go to [email or web address].    Your next appointment:    9 months

## 2023-10-11 NOTE — Progress Notes (Signed)
 Patient Care Team: Sheryle Carwin, MD as PCP - General (Internal Medicine) Alvan Dorn FALCON, MD as PCP - Cardiology (Cardiology) Juventino Lamar HERO, MD (Cardiology)   HPI  Shane [name] is a 89 y.o. male seen in follow-up for falls/syncope in the context of second-degree AV block type I with 2: 1 block, atrial flutter who underwent pacing Medtronic LBBA given the bradycardia and the falls 10/24  Less dizziness, no sob  Memory not great  Occ fall when bends over to pick up    Date Cr K Hgb  10/24 0.62 4.3 13.2         DATE TEST EF   10/24 Echo   55-60 %                Records and Results Reviewed   Past Medical History:  Diagnosis Date   Arteriosclerotic cardiovascular disease (ASCVD)    CABG surgery in 1990. MI in 1986; coronary angiography in 2003-critical LAD with patent LIMA; total obstruction of the RCA with patent RIMA; low normal EF.   Atrial flutter, paroxysmal (HCC)    asymptomatic; onset in 2010; 4:1 AVB with low-dose metoprolol ; moderate left      atrial enlargement and mild left ventricular hypertrophy with normal ejection fraction by echocardiography   Benign prostatic hypertrophy    Bifascicular block    EKG 07/27/22: SB with SA, first-degree AV block, RBBB, left posterior fascicular block (bifascicular block) .   Bradycardia    a. requiring cessation of beta blocker 12/2016.   Chronic anticoagulation    Current use of long term anticoagulation    Remains on chronic Coumadin    Hyperlipidemia    Lipid profile in 09/2010:196, 84, 54, 125   Mobitz type 1 second degree atrioventricular block 12/17/2016   Nephrolithiasis    Polymyalgia rheumatica (HCC)     Past Surgical History:  Procedure Laterality Date   COLONOSCOPY  08/21/01   RMR: Left-sided diverticulum.  Remainder of colonic mucosa appeared normal   COLONOSCOPY N/A 07/31/2013   Procedure: COLONOSCOPY;  Surgeon: Margo LITTIE Haddock, MD;  Location: [facility];  Service: Endoscopy;   Laterality: N/A;  10:30   CORONARY ARTERY BYPASS GRAFT  1990   LUMBAR DISC SURGERY     PACEMAKER IMPLANT N/A 07/04/2023   Procedure: PACEMAKER IMPLANT;  Surgeon: Fernande Elspeth BROCKS, MD;  Location: [facility] INVASIVE CV LAB;  Service: Cardiovascular;  Laterality: N/A;   TRANSURETHRAL RESECTION OF BLADDER TUMOR N/A 03/01/2017   Procedure: TRANSURETHRAL RESECTION OF BLADDER TUMOR (TURBT);  Surgeon: Matilda Senior, MD;  Location: [facility];  Service: Urology;  Laterality: N/A;   TRANSURETHRAL RESECTION OF BLADDER TUMOR N/A 04/12/2017   Procedure: REPEAT TRANSURETHRAL RESECTION OF BLADDER TUMOR (TURBT);  Surgeon: Matilda Senior, MD;  Location: [facility];  Service: Urology;  Laterality: N/A;  1 HR [phone] [employer]ET[employer] [id number]    Current Meds  Medication Sig   acetaminophen  (TYLENOL ) 500 MG tablet Take 1 tablet (500 mg total) by mouth every 6 (six) hours as needed for mild pain (pain score 1-3).   apixaban  (ELIQUIS ) 5 MG TABS tablet Take 1 tablet (5 mg total) by mouth 2 (two) times daily. (Start on Sunday 07/10/2023)   cyanocobalamin (VITAMIN B12) 1000 MCG tablet Take 1,000 mcg by mouth daily.   donepezil (ARICEPT) 5 MG tablet Take 5 mg by mouth in the morning.   escitalopram  (LEXAPRO ) 5 MG tablet Take 5 mg by mouth every evening.   QUEtiapine (SEROQUEL) 25  MG tablet Take 25 mg by mouth at bedtime.   tamsulosin  (FLOMAX ) 0.4 MG CAPS capsule Take 1 capsule (0.4 mg total) by mouth daily after supper.    No Known Allergies    Review of Systems negative except from HPI and PMH  Physical Exam BP 120/64   Pulse 65   Ht 1' (0.305 m)   Wt 171 lb 12.8 oz (77.9 kg)   SpO2 94%   BMI 838.81 kg/m  Well developed and well nourished in no acute distress HENT normal E scleral and icterus clear Neck Supple JVP flat; carotids brisk and full Clear to ausculation  Regular rate and rhythm, no murmurs gallops or rub Soft with active bowel sounds No clubbing cyanosis  Edema Alert and oriented, repeating  himself grossly normal motor and sensory function Skin Warm and Dry  ECG atrial fib @ 66 -/10/47  CrCl cannot be calculated (Patient's most recent lab result is older than the maximum 21 days allowed.).   Assessment and  Plan High grade heart block  2 AVB1>>2:1`  Syncope  Atrial fib persistent  Pacemaker-Medtronic-left bundle branch area-QRSd 95 ms     Symptoms of dizziness relieved, still with occasional falls related to bending   admonished to avoid  With paucity of symptoms with atrial fib will not try to restore rhyhm, as would require AAD, as having PAF prior to Persistence Continue Apixaban          Current medicines are reviewed at length with the patient today .  The patient does not  have concerns regarding medicines.

## 2023-10-28 LAB — CUP PACEART INCLINIC DEVICE CHECK
Date Time Interrogation Session: 20250204152044
Implantable Lead Connection Status: 753985
Implantable Lead Connection Status: 753985
Implantable Lead Implant Date: 20241028
Implantable Lead Implant Date: 20241028
Implantable Lead Location: 753859
Implantable Lead Location: 753860
Implantable Lead Model: 3830
Implantable Lead Model: 5076
Implantable Pulse Generator Implant Date: 20241028

## 2023-11-01 LAB — BASIC METABOLIC PANEL
BUN/Creatinine Ratio: 23 (ref 10–24)
BUN: 22 mg/dL (ref 8–27)
CO2: 24 mmol/L (ref 20–29)
Calcium: 9.6 mg/dL (ref 8.6–10.2)
Chloride: 103 mmol/L (ref 96–106)
Creatinine, Ser: 0.94 mg/dL (ref 0.76–1.27)
Glucose: 132 mg/dL — ABNORMAL HIGH (ref 70–99)
Potassium: 4.8 mmol/L (ref 3.5–5.2)
Sodium: 142 mmol/L (ref 134–144)
eGFR: 78 mL/min/{1.73_m2} (ref >59)

## 2023-11-07 ENCOUNTER — Encounter: Payer: Self-pay | Admitting: Internal Medicine

## 2023-11-14 NOTE — Progress Notes (Signed)
 Remote pacemaker transmission.

## 2023-12-27 ENCOUNTER — Other Ambulatory Visit: Payer: Medicare HMO | Admitting: Urology

## 2024-01-04 ENCOUNTER — Ambulatory Visit: Payer: Medicare HMO

## 2024-01-04 DIAGNOSIS — R001 Bradycardia, unspecified: Secondary | ICD-10-CM

## 2024-01-04 LAB — CUP PACEART REMOTE DEVICE CHECK
Battery Remaining Longevity: 147 mo
Battery Voltage: 3.16 V
Brady Statistic AP VP Percent: 3.78 %
Brady Statistic AP VS Percent: 0 %
Brady Statistic AS VP Percent: 95.88 %
Brady Statistic AS VS Percent: 0.19 %
Brady Statistic RA Percent Paced: 2.48 %
Brady Statistic RV Percent Paced: 86.23 %
Date Time Interrogation Session: 20250429195733
Implantable Lead Connection Status: 753985
Implantable Lead Connection Status: 753985
Implantable Lead Implant Date: 20241028
Implantable Lead Implant Date: 20241028
Implantable Lead Location: 753859
Implantable Lead Location: 753860
Implantable Lead Model: 3830
Implantable Lead Model: 5076
Implantable Pulse Generator Implant Date: 20241028
Lead Channel Impedance Value: 285 Ohm
Lead Channel Impedance Value: 342 Ohm
Lead Channel Impedance Value: 456 Ohm
Lead Channel Impedance Value: 494 Ohm
Lead Channel Pacing Threshold Amplitude: 0.625 V
Lead Channel Pacing Threshold Amplitude: 1.125 V
Lead Channel Pacing Threshold Pulse Width: 0.4 ms
Lead Channel Pacing Threshold Pulse Width: 0.4 ms
Lead Channel Sensing Intrinsic Amplitude: 2.875 mV
Lead Channel Sensing Intrinsic Amplitude: 2.875 mV
Lead Channel Sensing Intrinsic Amplitude: 7.875 mV
Lead Channel Sensing Intrinsic Amplitude: 7.875 mV
Lead Channel Setting Pacing Amplitude: 1.75 V
Lead Channel Setting Pacing Amplitude: 2 V
Lead Channel Setting Pacing Pulse Width: 0.4 ms
Lead Channel Setting Sensing Sensitivity: 1.2 mV
Zone Setting Status: 755011

## 2024-01-16 ENCOUNTER — Encounter: Payer: Self-pay | Admitting: Cardiovascular Disease

## 2024-02-16 NOTE — Progress Notes (Signed)
 Remote pacemaker transmission.

## 2024-04-04 ENCOUNTER — Ambulatory Visit: Payer: Medicare HMO

## 2024-04-04 DIAGNOSIS — R001 Bradycardia, unspecified: Secondary | ICD-10-CM | POA: Diagnosis not present

## 2024-04-04 LAB — CUP PACEART REMOTE DEVICE CHECK
Battery Remaining Longevity: 145 mo
Battery Voltage: 3.11 V
Brady Statistic AP VP Percent: 3.09 %
Brady Statistic AP VS Percent: 0 %
Brady Statistic AS VP Percent: 96.59 %
Brady Statistic AS VS Percent: 0.11 %
Brady Statistic RA Percent Paced: 1.71 %
Brady Statistic RV Percent Paced: 90.24 %
Date Time Interrogation Session: 20250730010148
Implantable Lead Connection Status: 753985
Implantable Lead Connection Status: 753985
Implantable Lead Implant Date: 20241028
Implantable Lead Implant Date: 20241028
Implantable Lead Location: 753859
Implantable Lead Location: 753860
Implantable Lead Model: 3830
Implantable Lead Model: 5076
Implantable Pulse Generator Implant Date: 20241028
Lead Channel Impedance Value: 285 Ohm
Lead Channel Impedance Value: 342 Ohm
Lead Channel Impedance Value: 456 Ohm
Lead Channel Impedance Value: 494 Ohm
Lead Channel Pacing Threshold Amplitude: 0.625 V
Lead Channel Pacing Threshold Amplitude: 1.125 V
Lead Channel Pacing Threshold Pulse Width: 0.4 ms
Lead Channel Pacing Threshold Pulse Width: 0.4 ms
Lead Channel Sensing Intrinsic Amplitude: 3.125 mV
Lead Channel Sensing Intrinsic Amplitude: 3.125 mV
Lead Channel Sensing Intrinsic Amplitude: 8.625 mV
Lead Channel Sensing Intrinsic Amplitude: 8.625 mV
Lead Channel Setting Pacing Amplitude: 1.75 V
Lead Channel Setting Pacing Amplitude: 2 V
Lead Channel Setting Pacing Pulse Width: 0.4 ms
Lead Channel Setting Sensing Sensitivity: 1.2 mV
Zone Setting Status: 755011

## 2024-04-16 ENCOUNTER — Ambulatory Visit: Payer: Self-pay | Admitting: Cardiovascular Disease

## 2024-06-06 NOTE — Progress Notes (Signed)
 Remote PPM Transmission

## 2024-07-04 ENCOUNTER — Ambulatory Visit (INDEPENDENT_AMBULATORY_CARE_PROVIDER_SITE_OTHER): Payer: Medicare HMO

## 2024-07-04 DIAGNOSIS — R001 Bradycardia, unspecified: Secondary | ICD-10-CM | POA: Diagnosis not present

## 2024-07-05 LAB — CUP PACEART REMOTE DEVICE CHECK
Battery Remaining Longevity: 142 mo
Battery Voltage: 3.06 V
Brady Statistic AP VP Percent: 3.24 %
Brady Statistic AP VS Percent: 0 %
Brady Statistic AS VP Percent: 96.47 %
Brady Statistic AS VS Percent: 0.1 %
Brady Statistic RA Percent Paced: 1.96 %
Brady Statistic RV Percent Paced: 90.88 %
Date Time Interrogation Session: 20251028214807
Implantable Lead Connection Status: 753985
Implantable Lead Connection Status: 753985
Implantable Lead Implant Date: 20241028
Implantable Lead Implant Date: 20241028
Implantable Lead Location: 753859
Implantable Lead Location: 753860
Implantable Lead Model: 3830
Implantable Lead Model: 5076
Implantable Pulse Generator Implant Date: 20241028
Lead Channel Impedance Value: 285 Ohm
Lead Channel Impedance Value: 323 Ohm
Lead Channel Impedance Value: 456 Ohm
Lead Channel Impedance Value: 456 Ohm
Lead Channel Pacing Threshold Amplitude: 0.75 V
Lead Channel Pacing Threshold Amplitude: 1.125 V
Lead Channel Pacing Threshold Pulse Width: 0.4 ms
Lead Channel Pacing Threshold Pulse Width: 0.4 ms
Lead Channel Sensing Intrinsic Amplitude: 2 mV
Lead Channel Sensing Intrinsic Amplitude: 2 mV
Lead Channel Sensing Intrinsic Amplitude: 8.875 mV
Lead Channel Sensing Intrinsic Amplitude: 8.875 mV
Lead Channel Setting Pacing Amplitude: 1.75 V
Lead Channel Setting Pacing Amplitude: 2 V
Lead Channel Setting Pacing Pulse Width: 0.4 ms
Lead Channel Setting Sensing Sensitivity: 1.2 mV
Zone Setting Status: 755011

## 2024-07-11 NOTE — Progress Notes (Signed)
 Remote PPM Transmission

## 2024-07-12 ENCOUNTER — Ambulatory Visit: Payer: Self-pay | Admitting: Cardiovascular Disease

## 2024-10-03 ENCOUNTER — Ambulatory Visit: Payer: Medicare HMO

## 2024-10-03 DIAGNOSIS — R001 Bradycardia, unspecified: Secondary | ICD-10-CM

## 2024-10-03 LAB — CUP PACEART REMOTE DEVICE CHECK
Battery Remaining Longevity: 139 mo
Battery Voltage: 3.03 V
Brady Statistic AP VP Percent: 2.16 %
Brady Statistic AP VS Percent: 0 %
Brady Statistic AS VP Percent: 97.55 %
Brady Statistic AS VS Percent: 0.06 %
Brady Statistic RA Percent Paced: 1.47 %
Brady Statistic RV Percent Paced: 91.26 %
Date Time Interrogation Session: 20260128035502
Implantable Lead Connection Status: 753985
Implantable Lead Connection Status: 753985
Implantable Lead Implant Date: 20241028
Implantable Lead Implant Date: 20241028
Implantable Lead Location: 753859
Implantable Lead Location: 753860
Implantable Lead Model: 3830
Implantable Lead Model: 5076
Implantable Pulse Generator Implant Date: 20241028
Lead Channel Impedance Value: 285 Ohm
Lead Channel Impedance Value: 323 Ohm
Lead Channel Impedance Value: 456 Ohm
Lead Channel Impedance Value: 456 Ohm
Lead Channel Pacing Threshold Amplitude: 0.75 V
Lead Channel Pacing Threshold Amplitude: 1 V
Lead Channel Pacing Threshold Pulse Width: 0.4 ms
Lead Channel Pacing Threshold Pulse Width: 0.4 ms
Lead Channel Sensing Intrinsic Amplitude: 0.875 mV
Lead Channel Sensing Intrinsic Amplitude: 0.875 mV
Lead Channel Sensing Intrinsic Amplitude: 8.125 mV
Lead Channel Sensing Intrinsic Amplitude: 8.125 mV
Lead Channel Setting Pacing Amplitude: 1.5 V
Lead Channel Setting Pacing Amplitude: 2 V
Lead Channel Setting Pacing Pulse Width: 0.4 ms
Lead Channel Setting Sensing Sensitivity: 1.2 mV
Zone Setting Status: 755011

## 2024-10-04 ENCOUNTER — Ambulatory Visit: Payer: Self-pay | Admitting: Cardiovascular Disease

## 2024-10-11 NOTE — Progress Notes (Signed)
 Remote PPM Transmission
